# Patient Record
Sex: Female | Born: 1955 | Race: White | Hispanic: No | Marital: Married | Smoking: Never smoker
Health system: Southern US, Community
[De-identification: ages and names within clinical notes are randomized; demographics above are authoritative.]

## PROBLEM LIST (undated history)

## (undated) DIAGNOSIS — F3289 Other specified depressive episodes: Secondary | ICD-10-CM

## (undated) DIAGNOSIS — R51 Headache: Secondary | ICD-10-CM

## (undated) DIAGNOSIS — R519 Headache, unspecified: Secondary | ICD-10-CM

## (undated) DIAGNOSIS — F411 Generalized anxiety disorder: Secondary | ICD-10-CM

## (undated) DIAGNOSIS — M1990 Unspecified osteoarthritis, unspecified site: Secondary | ICD-10-CM

## (undated) DIAGNOSIS — K219 Gastro-esophageal reflux disease without esophagitis: Secondary | ICD-10-CM

## (undated) DIAGNOSIS — K449 Diaphragmatic hernia without obstruction or gangrene: Secondary | ICD-10-CM

## (undated) DIAGNOSIS — R011 Cardiac murmur, unspecified: Secondary | ICD-10-CM

## (undated) DIAGNOSIS — R7303 Prediabetes: Secondary | ICD-10-CM

## (undated) DIAGNOSIS — F329 Major depressive disorder, single episode, unspecified: Secondary | ICD-10-CM

## (undated) DIAGNOSIS — F410 Panic disorder [episodic paroxysmal anxiety] without agoraphobia: Secondary | ICD-10-CM

## (undated) DIAGNOSIS — M810 Age-related osteoporosis without current pathological fracture: Secondary | ICD-10-CM

## (undated) DIAGNOSIS — L719 Rosacea, unspecified: Secondary | ICD-10-CM

## (undated) DIAGNOSIS — E785 Hyperlipidemia, unspecified: Secondary | ICD-10-CM

## (undated) DIAGNOSIS — K635 Polyp of colon: Secondary | ICD-10-CM

## (undated) DIAGNOSIS — K5790 Diverticulosis of intestine, part unspecified, without perforation or abscess without bleeding: Secondary | ICD-10-CM

## (undated) HISTORY — DX: Major depressive disorder, single episode, unspecified: F32.9

## (undated) HISTORY — DX: Other specified depressive episodes: F32.89

## (undated) HISTORY — DX: Generalized anxiety disorder: F41.1

## (undated) HISTORY — DX: Hyperlipidemia, unspecified: E78.5

## (undated) HISTORY — DX: Unspecified osteoarthritis, unspecified site: M19.90

## (undated) HISTORY — DX: Rosacea, unspecified: L71.9

## (undated) HISTORY — DX: Panic disorder (episodic paroxysmal anxiety): F41.0

## (undated) HISTORY — DX: Diverticulosis of intestine, part unspecified, without perforation or abscess without bleeding: K57.90

## (undated) HISTORY — DX: Diaphragmatic hernia without obstruction or gangrene: K44.9

## (undated) HISTORY — DX: Headache: R51

## (undated) HISTORY — PX: FOOT SURGERY: SHX648

## (undated) HISTORY — DX: Polyp of colon: K63.5

## (undated) HISTORY — DX: Age-related osteoporosis without current pathological fracture: M81.0

## (undated) HISTORY — PX: COLONOSCOPY: SHX174

## (undated) HISTORY — DX: Headache, unspecified: R51.9

## (undated) HISTORY — DX: Gastro-esophageal reflux disease without esophagitis: K21.9

---

## 1997-06-02 ENCOUNTER — Ambulatory Visit (HOSPITAL_COMMUNITY): Admission: RE | Admit: 1997-06-02 | Discharge: 1997-06-02 | Payer: Self-pay | Admitting: Obstetrics and Gynecology

## 1998-06-08 ENCOUNTER — Encounter: Payer: Self-pay | Admitting: Obstetrics and Gynecology

## 1998-06-08 ENCOUNTER — Ambulatory Visit (HOSPITAL_COMMUNITY): Admission: RE | Admit: 1998-06-08 | Discharge: 1998-06-08 | Payer: Self-pay | Admitting: Obstetrics and Gynecology

## 1998-06-22 ENCOUNTER — Ambulatory Visit (HOSPITAL_BASED_OUTPATIENT_CLINIC_OR_DEPARTMENT_OTHER): Admission: RE | Admit: 1998-06-22 | Discharge: 1998-06-22 | Payer: Self-pay | Admitting: General Surgery

## 1999-03-12 ENCOUNTER — Other Ambulatory Visit: Admission: RE | Admit: 1999-03-12 | Discharge: 1999-03-12 | Payer: Self-pay | Admitting: Obstetrics and Gynecology

## 1999-06-10 ENCOUNTER — Encounter: Payer: Self-pay | Admitting: Obstetrics and Gynecology

## 1999-06-10 ENCOUNTER — Ambulatory Visit (HOSPITAL_COMMUNITY): Admission: RE | Admit: 1999-06-10 | Discharge: 1999-06-10 | Payer: Self-pay | Admitting: Obstetrics and Gynecology

## 2000-02-03 ENCOUNTER — Other Ambulatory Visit: Admission: RE | Admit: 2000-02-03 | Discharge: 2000-02-03 | Payer: Self-pay | Admitting: Obstetrics and Gynecology

## 2000-07-13 ENCOUNTER — Encounter: Payer: Self-pay | Admitting: Obstetrics and Gynecology

## 2000-07-13 ENCOUNTER — Ambulatory Visit (HOSPITAL_COMMUNITY): Admission: RE | Admit: 2000-07-13 | Discharge: 2000-07-13 | Payer: Self-pay | Admitting: Obstetrics and Gynecology

## 2001-03-31 ENCOUNTER — Other Ambulatory Visit: Admission: RE | Admit: 2001-03-31 | Discharge: 2001-03-31 | Payer: Self-pay | Admitting: Obstetrics and Gynecology

## 2001-07-14 ENCOUNTER — Ambulatory Visit (HOSPITAL_COMMUNITY): Admission: RE | Admit: 2001-07-14 | Discharge: 2001-07-14 | Payer: Self-pay | Admitting: Obstetrics and Gynecology

## 2001-07-14 ENCOUNTER — Encounter: Payer: Self-pay | Admitting: Obstetrics and Gynecology

## 2001-10-27 ENCOUNTER — Encounter (INDEPENDENT_AMBULATORY_CARE_PROVIDER_SITE_OTHER): Payer: Self-pay | Admitting: *Deleted

## 2001-10-27 ENCOUNTER — Ambulatory Visit (HOSPITAL_COMMUNITY): Admission: RE | Admit: 2001-10-27 | Discharge: 2001-10-27 | Payer: Self-pay | Admitting: *Deleted

## 2002-03-28 ENCOUNTER — Other Ambulatory Visit: Admission: RE | Admit: 2002-03-28 | Discharge: 2002-03-28 | Payer: Self-pay | Admitting: Obstetrics and Gynecology

## 2002-08-17 ENCOUNTER — Ambulatory Visit (HOSPITAL_COMMUNITY): Admission: RE | Admit: 2002-08-17 | Discharge: 2002-08-17 | Payer: Self-pay | Admitting: Obstetrics and Gynecology

## 2002-08-17 ENCOUNTER — Encounter: Payer: Self-pay | Admitting: Obstetrics and Gynecology

## 2002-08-26 ENCOUNTER — Encounter: Admission: RE | Admit: 2002-08-26 | Discharge: 2002-08-26 | Payer: Self-pay | Admitting: Obstetrics and Gynecology

## 2002-08-26 ENCOUNTER — Encounter: Payer: Self-pay | Admitting: Obstetrics and Gynecology

## 2002-08-31 ENCOUNTER — Emergency Department (HOSPITAL_COMMUNITY): Admission: EM | Admit: 2002-08-31 | Discharge: 2002-08-31 | Payer: Self-pay | Admitting: Emergency Medicine

## 2002-08-31 ENCOUNTER — Encounter: Payer: Self-pay | Admitting: Emergency Medicine

## 2003-02-03 ENCOUNTER — Encounter: Admission: RE | Admit: 2003-02-03 | Discharge: 2003-02-03 | Payer: Self-pay | Admitting: Obstetrics and Gynecology

## 2003-04-06 ENCOUNTER — Other Ambulatory Visit: Admission: RE | Admit: 2003-04-06 | Discharge: 2003-04-06 | Payer: Self-pay | Admitting: Obstetrics and Gynecology

## 2003-09-13 ENCOUNTER — Encounter: Admission: RE | Admit: 2003-09-13 | Discharge: 2003-09-13 | Payer: Self-pay | Admitting: Obstetrics and Gynecology

## 2004-04-16 ENCOUNTER — Other Ambulatory Visit: Admission: RE | Admit: 2004-04-16 | Discharge: 2004-04-16 | Payer: Self-pay | Admitting: Obstetrics and Gynecology

## 2004-09-18 ENCOUNTER — Encounter: Admission: RE | Admit: 2004-09-18 | Discharge: 2004-09-18 | Payer: Self-pay | Admitting: Obstetrics and Gynecology

## 2005-04-22 ENCOUNTER — Other Ambulatory Visit: Admission: RE | Admit: 2005-04-22 | Discharge: 2005-04-22 | Payer: Self-pay | Admitting: Obstetrics and Gynecology

## 2005-06-05 ENCOUNTER — Encounter: Admission: RE | Admit: 2005-06-05 | Discharge: 2005-06-05 | Payer: Self-pay | Admitting: Obstetrics and Gynecology

## 2005-06-19 ENCOUNTER — Encounter: Admission: RE | Admit: 2005-06-19 | Discharge: 2005-06-19 | Payer: Self-pay | Admitting: Obstetrics and Gynecology

## 2005-10-17 ENCOUNTER — Encounter: Admission: RE | Admit: 2005-10-17 | Discharge: 2005-10-17 | Payer: Self-pay | Admitting: Addiction Medicine

## 2006-04-23 ENCOUNTER — Other Ambulatory Visit: Admission: RE | Admit: 2006-04-23 | Discharge: 2006-04-23 | Payer: Self-pay | Admitting: Obstetrics and Gynecology

## 2006-09-09 ENCOUNTER — Ambulatory Visit: Payer: Self-pay | Admitting: Internal Medicine

## 2006-09-24 ENCOUNTER — Encounter: Payer: Self-pay | Admitting: Internal Medicine

## 2006-09-24 ENCOUNTER — Ambulatory Visit: Payer: Self-pay | Admitting: Internal Medicine

## 2006-09-24 DIAGNOSIS — K5730 Diverticulosis of large intestine without perforation or abscess without bleeding: Secondary | ICD-10-CM | POA: Insufficient documentation

## 2006-09-24 DIAGNOSIS — K449 Diaphragmatic hernia without obstruction or gangrene: Secondary | ICD-10-CM | POA: Insufficient documentation

## 2006-09-24 DIAGNOSIS — D126 Benign neoplasm of colon, unspecified: Secondary | ICD-10-CM

## 2006-12-02 ENCOUNTER — Ambulatory Visit: Payer: Self-pay | Admitting: Internal Medicine

## 2006-12-10 ENCOUNTER — Encounter: Admission: RE | Admit: 2006-12-10 | Discharge: 2006-12-10 | Payer: Self-pay | Admitting: Obstetrics and Gynecology

## 2007-03-11 HISTORY — PX: BREAST BIOPSY: SHX20

## 2007-05-11 ENCOUNTER — Other Ambulatory Visit: Admission: RE | Admit: 2007-05-11 | Discharge: 2007-05-11 | Payer: Self-pay | Admitting: Obstetrics and Gynecology

## 2007-05-21 DIAGNOSIS — R519 Headache, unspecified: Secondary | ICD-10-CM | POA: Insufficient documentation

## 2007-05-21 DIAGNOSIS — R51 Headache: Secondary | ICD-10-CM | POA: Insufficient documentation

## 2007-05-21 DIAGNOSIS — K621 Rectal polyp: Secondary | ICD-10-CM

## 2007-05-21 DIAGNOSIS — F411 Generalized anxiety disorder: Secondary | ICD-10-CM | POA: Insufficient documentation

## 2007-05-21 DIAGNOSIS — F410 Panic disorder [episodic paroxysmal anxiety] without agoraphobia: Secondary | ICD-10-CM

## 2007-05-21 DIAGNOSIS — K620 Anal polyp: Secondary | ICD-10-CM | POA: Insufficient documentation

## 2007-05-21 DIAGNOSIS — F3289 Other specified depressive episodes: Secondary | ICD-10-CM | POA: Insufficient documentation

## 2007-05-21 DIAGNOSIS — F329 Major depressive disorder, single episode, unspecified: Secondary | ICD-10-CM

## 2007-12-20 ENCOUNTER — Encounter: Admission: RE | Admit: 2007-12-20 | Discharge: 2007-12-20 | Payer: Self-pay | Admitting: Obstetrics and Gynecology

## 2007-12-29 ENCOUNTER — Encounter (INDEPENDENT_AMBULATORY_CARE_PROVIDER_SITE_OTHER): Payer: Self-pay | Admitting: Diagnostic Radiology

## 2007-12-29 ENCOUNTER — Encounter: Admission: RE | Admit: 2007-12-29 | Discharge: 2007-12-29 | Payer: Self-pay | Admitting: Obstetrics and Gynecology

## 2008-06-06 ENCOUNTER — Ambulatory Visit: Payer: Self-pay | Admitting: Obstetrics and Gynecology

## 2008-06-06 ENCOUNTER — Encounter: Payer: Self-pay | Admitting: Obstetrics and Gynecology

## 2008-06-06 ENCOUNTER — Other Ambulatory Visit: Admission: RE | Admit: 2008-06-06 | Discharge: 2008-06-06 | Payer: Self-pay | Admitting: Obstetrics and Gynecology

## 2008-06-29 ENCOUNTER — Encounter: Admission: RE | Admit: 2008-06-29 | Discharge: 2008-06-29 | Payer: Self-pay | Admitting: Obstetrics and Gynecology

## 2008-12-21 ENCOUNTER — Encounter: Admission: RE | Admit: 2008-12-21 | Discharge: 2008-12-21 | Payer: Self-pay | Admitting: Obstetrics and Gynecology

## 2009-06-14 ENCOUNTER — Other Ambulatory Visit: Admission: RE | Admit: 2009-06-14 | Discharge: 2009-06-14 | Payer: Self-pay | Admitting: Obstetrics and Gynecology

## 2009-06-14 ENCOUNTER — Ambulatory Visit: Payer: Self-pay | Admitting: Obstetrics and Gynecology

## 2009-12-24 ENCOUNTER — Encounter: Admission: RE | Admit: 2009-12-24 | Discharge: 2009-12-24 | Payer: Self-pay | Admitting: Obstetrics and Gynecology

## 2010-04-30 DIAGNOSIS — M899 Disorder of bone, unspecified: Secondary | ICD-10-CM | POA: Insufficient documentation

## 2010-06-20 ENCOUNTER — Encounter (INDEPENDENT_AMBULATORY_CARE_PROVIDER_SITE_OTHER): Payer: BC Managed Care – PPO | Admitting: Obstetrics and Gynecology

## 2010-06-20 ENCOUNTER — Other Ambulatory Visit (HOSPITAL_COMMUNITY)
Admission: RE | Admit: 2010-06-20 | Discharge: 2010-06-20 | Disposition: A | Discharge status: Home / Self Care | Payer: BC Managed Care – PPO | Source: Ambulatory Visit | Attending: Obstetrics and Gynecology | Admitting: Obstetrics and Gynecology

## 2010-06-20 ENCOUNTER — Other Ambulatory Visit: Payer: Self-pay | Admitting: Obstetrics and Gynecology

## 2010-06-20 DIAGNOSIS — Z124 Encounter for screening for malignant neoplasm of cervix: Secondary | ICD-10-CM | POA: Insufficient documentation

## 2010-06-20 DIAGNOSIS — N912 Amenorrhea, unspecified: Secondary | ICD-10-CM

## 2010-06-20 DIAGNOSIS — Z01419 Encounter for gynecological examination (general) (routine) without abnormal findings: Secondary | ICD-10-CM

## 2010-06-20 DIAGNOSIS — R823 Hemoglobinuria: Secondary | ICD-10-CM

## 2010-07-23 NOTE — Assessment & Plan Note (Signed)
[facility]                         GASTROENTEROLOGY OFFICE NOTE   JAZSMIN, COUSE                       MRN:          [id number]  DATE:12/02/2006                            DOB:          02-Sep-1955    Ms. Stivers is a very nice 55 year old white female whom we saw at Dr.  Lonell Face recommendation on September 24, 2006, for upper endoscopy and  colonoscopy.  She was then having gastroesophageal reflux and also  history of adenomatous polyp from previous colonoscopy about 5 years  before that.  Since the colonoscopy, she has had intermittent rectal  bleeding of bright red blood especially when she wipes.  Her bowel  habits have been quite irregular.  She denies any abdominal pain.  As  far as her upper endoscopy is concerned, she was found to have a smaller  __________  hiatal hernia measuring 2 to 3 cm.  She has been on Aciphex  20 mg today with good control of her symptoms.  She wanted to know  exactly what symptoms hiatal hernia causes and how to treat it.   MEDICATIONS:  1. Effexor 75 mg p.o. daily.  2. Aciphex 200 mg p.o. daily.  3. Birth control pills.  4. Multiple vitamins, Ocuvite and vitamins C, B and E.   PHYSICAL EXAMINATION:  Blood pressure 114/74, pulse 74 and weight 152  pounds.  She was alert, oriented and in no distress.  ABDOMEN EXAMINATION:  Showed normal abdomen with no tenderness.  Normoactive bowel sounds.  No distention.  Rectal and endoscopic  examination:  Reveals normal perianal area, normoactive tones.  There is  hypertrophy to papillae in the anal canal at the dentate line.  One of  them seemed to be prolapsing and seemed to be erythematous with some  contact bleeding.  There was small unremarkable hemorrhoids without  stigmata of bleeding.  Stool is Hemoccult negative.   IMPRESSION:  1. A 55 year old white female with anal source of bleeding originating      at the dentate line of the anal canal.  Her rectal papillae  seem to      be hypertrophic, most likely causing intermittent bleeding due to      straining and diarrhea.  2. Gastroesophageal reflux disease and small hiatal hernia currently      controlled on Aciphex.   PLAN:  1. I have explained to the patient the mechanisms of hiatal hernia      with the gastroesophageal reflux, so she understands.  She will      continue on anti-reflux measure.  2. Analpram-HC cream 2-1/2% to use p.r.n.  Samples given.  3. Irritable bowel regimen.  High fiber diet with fiber supplements as      needed.  I showed her      that there is no danger from her bleeding; that her colon      examination was normal with the exception of small hyperplastic      polyps in the rectal sigmoid which were all removed.     Hedwig Morton. Juanda Chance, MD  Electronically Signed    DMB/MedQ  DD: 12/02/2006  DT: 12/03/2006  Job #: [id number]   cc:   Kendrick Ranch, M.D.

## 2010-08-08 ENCOUNTER — Telehealth: Payer: Self-pay | Admitting: Internal Medicine

## 2010-08-08 NOTE — Telephone Encounter (Signed)
Left a message for patient to call me back.

## 2010-08-08 NOTE — Telephone Encounter (Signed)
Spoke with patient. For a couple of months now, she has had rectal bleeding and rectal pain. She saw her PCP and was given Anusol HC suppositories which she is using. She is still having the bleeding and pain and would like to be seen. Scheduled patient with Willette Cluster, NP on 08/14/10 at 11:00 AM(Dr. Juanda Chance supervising). Last colon 7/17/8- adenomatous polyps, diverticulosis

## 2010-08-12 NOTE — Telephone Encounter (Signed)
reviewed and agree for pt to see Gunnar Fusi

## 2010-08-14 ENCOUNTER — Encounter: Payer: Self-pay | Admitting: Nurse Practitioner

## 2010-08-14 ENCOUNTER — Ambulatory Visit (INDEPENDENT_AMBULATORY_CARE_PROVIDER_SITE_OTHER): Payer: BC Managed Care – PPO | Admitting: Nurse Practitioner

## 2010-08-14 VITALS — BP 120/80 | HR 68 | Ht 62.0 in | Wt 144.4 lb

## 2010-08-14 DIAGNOSIS — K602 Anal fissure, unspecified: Secondary | ICD-10-CM

## 2010-08-14 MED ORDER — AMBULATORY NON FORMULARY MEDICATION: Status: DC

## 2010-08-14 MED ORDER — LIDOCAINE HCL 2 % EX GEL: CUTANEOUS | Status: AC

## 2010-08-14 NOTE — Progress Notes (Signed)
08/14/2010 Gabrielle Gonzalez [id number] 1955/07/02   HISTORY OF PRESENT ILLNESS:  Patient is a 55 year old female last seen at time of her colonoscopy in 2008 which was done by Dr. Juanda Chance for history of colon polyps. Two weeks ago patient developed some intermittent rectal bleeding and burning inside of her anus. Now having severe anal pain with bowel movements. She had this happen in the fall at which time she was given Anusol and stool softeners and had complete recovery. Currently it hurts to even insert suppositories. As far as bowel movements, patient gives a history of chronic alternating constipation and diarrhea.    Past Medical History  Diagnosis Date  . Colon polyp   . Diverticulosis   . Hiatal hernia   . Headache, chronic daily   . Panic disorder without agoraphobia   . Anxiety state, unspecified   . Depressive disorder, not elsewhere classified    Past Surgical History  Procedure Date  . Anal polyp removal     reports that she has never smoked. She does not have any smokeless tobacco history on file. She reports that she drinks alcohol. She reports that she does not use illicit drugs. family history includes Prostate cancer in her father.  There is no history of Colon cancer. Allergies  Allergen Reactions  . Erythromycin   . Lodine (Etodolac)   . Penicillins       Outpatient Encounter Prescriptions as of 08/14/2010  Medication Sig Dispense Refill  . Cholecalciferol (VITAMIN D3) 1000 UNITS CAPS Take 1 capsule by mouth daily.        . Omega-3 Fatty Acids (FISH OIL) 1000 MG CPDR Take 1 capsule by mouth daily.        Marland Kitchen omeprazole (PRILOSEC OTC) 20 MG tablet Take 20 mg by mouth as needed.        . venlafaxine (EFFEXOR) 75 MG tablet Take 75 mg by mouth daily.        Marland Kitchen DISCONTD: Multiple Vitamin (MULTIVITAMIN) tablet Take 1 tablet by mouth daily.        Marland Kitchen DISCONTD: Multiple Vitamins-Minerals (OCUVITE ADULT FORMULA PO) Take 1 tablet by mouth daily.        Marland Kitchen DISCONTD: RABEprazole  (ACIPHEX) 20 MG tablet Take 20 mg by mouth daily.           REVIEW OF SYSTEMS  : All other systems reviewed and negative except where noted in the History of Present Illness.   PHYSICAL EXAM: General: Well developed white female in no acute distress Head: Normocephalic and atraumatic Eyes:  sclerae anicteric,conjunctive pink. Ears: Normal auditory acuity Mouth: No deformity or lesions Neck: Supple, no masses.  Lungs: Clear throughout to auscultation Heart: Regular rate and rhythm; no murmurs heard Abdomen: Soft, non distended, nontender. No masses or hepatomegaly noted. Normal Bowel sounds Rectal: Midline posterior fissure. Digital rectal exam not done secondary to patient's physical discomfort. Musculoskeletal: Symmetrical with no gross deformities  Skin: No lesions on visible extremities Extremities: No edema or deformities noted Neurological: Alert oriented x 4, grossly nonfocal Cervical Nodes:  No significant cervical adenopathy Psychological:  Alert and cooperative. Normal mood and affect  ASSESSMENT AND PLAN;

## 2010-08-14 NOTE — Patient Instructions (Signed)
Rx. For Diltiazem gel phoned in to [facility] for you to pick up. Xylocaine gel sent to [facility]  Miralax samples given for you to take daily. Call office  in 10 days and let Gunnar Fusi know how you are doing.  [phone].  (ask for Pam)

## 2010-08-15 DIAGNOSIS — K602 Anal fissure, unspecified: Secondary | ICD-10-CM | POA: Insufficient documentation

## 2010-08-15 NOTE — Assessment & Plan Note (Addendum)
Severe anal pain and rectal bleeding with midline posterior anal fissure examination. Of note, patient has a history of hyperplastic anal polyps, she is up-to-date on her colorectal cancer screening. Will treat fissure with diltiazem gel. For severe anal pain will give her Xylocaine jelly to use as needed. We discussed sitz baths several times a day. Patient has a history of irritable bowel syndrome with alternating constipation and diarrhea. She has tended towards constipation recently. Will try her on daily MiraLax which she can titrate to her needs. She should return to clinic in 3-4 weeks, or earlier if symptoms are not responding to treatment.

## 2010-08-18 NOTE — Progress Notes (Signed)
Reviewed and agree with plan of treatment 

## 2010-12-04 ENCOUNTER — Other Ambulatory Visit: Payer: Self-pay | Admitting: Obstetrics and Gynecology

## 2010-12-04 ENCOUNTER — Other Ambulatory Visit: Payer: Self-pay | Admitting: Urology

## 2010-12-04 DIAGNOSIS — R51 Headache: Secondary | ICD-10-CM

## 2010-12-04 DIAGNOSIS — Z1231 Encounter for screening mammogram for malignant neoplasm of breast: Secondary | ICD-10-CM

## 2010-12-10 ENCOUNTER — Ambulatory Visit
Admission: RE | Admit: 2010-12-10 | Discharge: 2010-12-10 | Disposition: A | Discharge status: Home / Self Care | Payer: BC Managed Care – PPO | Source: Ambulatory Visit | Attending: Urology | Admitting: Urology

## 2010-12-10 DIAGNOSIS — R51 Headache: Secondary | ICD-10-CM

## 2010-12-10 MED ORDER — GADOBENATE DIMEGLUMINE 529 MG/ML IV SOLN
14.0000 mL | Freq: Once | INTRAVENOUS | Status: AC | PRN
  Administered 2010-12-10: 14 mL via INTRAVENOUS

## 2010-12-26 ENCOUNTER — Ambulatory Visit
Admission: RE | Admit: 2010-12-26 | Discharge: 2010-12-26 | Disposition: A | Discharge status: Home / Self Care | Payer: BC Managed Care – PPO | Source: Ambulatory Visit | Attending: Obstetrics and Gynecology | Admitting: Obstetrics and Gynecology

## 2010-12-26 DIAGNOSIS — Z1231 Encounter for screening mammogram for malignant neoplasm of breast: Secondary | ICD-10-CM

## 2011-06-13 ENCOUNTER — Encounter: Payer: Self-pay | Admitting: Gynecology

## 2011-06-13 DIAGNOSIS — L719 Rosacea, unspecified: Secondary | ICD-10-CM | POA: Insufficient documentation

## 2011-06-13 DIAGNOSIS — R32 Unspecified urinary incontinence: Secondary | ICD-10-CM | POA: Insufficient documentation

## 2011-06-23 ENCOUNTER — Ambulatory Visit (INDEPENDENT_AMBULATORY_CARE_PROVIDER_SITE_OTHER): Payer: BC Managed Care – PPO | Admitting: Obstetrics and Gynecology

## 2011-06-23 ENCOUNTER — Encounter: Payer: Self-pay | Admitting: Obstetrics and Gynecology

## 2011-06-23 VITALS — BP 120/76 | Ht 62.0 in | Wt 145.0 lb

## 2011-06-23 DIAGNOSIS — Z01419 Encounter for gynecological examination (general) (routine) without abnormal findings: Secondary | ICD-10-CM

## 2011-06-23 MED ORDER — ESTROGENS, CONJUGATED 0.625 MG/GM VA CREA: TOPICAL_CREAM | Freq: Every day | VAGINAL | Status: AC

## 2011-06-23 NOTE — Progress Notes (Signed)
Patient came to see me today for her annual GYN exam. She still has irregular cycles. She does have some mild hot flashes. She takes Effexor and we wonder if that isn't helping the above. She uses Premarin vaginal cream for atrophic vaginitis. She is not having pelvic pain. She has no bladder symptoms. She is up-to-date on mammograms.  Physical examination: Kennon Portela present. HEENT within normal limits. Neck: Thyroid not large. No masses. Supraclavicular nodes: not enlarged. Breasts: Examined in both sitting and lying  position. No skin changes and no masses. Abdomen: Soft no guarding rebound or masses or hernia. Pelvic: External: Within normal limits. BUS: Within normal limits. Vaginal:within normal limits. Poor estrogen effect. No evidence of cystocele rectocele or enterocele. Cervix: clean. Uterus: Normal size and shape. Adnexa: No masses. Rectovaginal exam: Confirmatory and negative. Extremities: Within normal limits.  Assessment: #1. Mild transitional symptoms #2. Atrophic vaginitis  Plan: Continue her mammograms yearly. Reordered Premarin vaginal cream. Lab work through her PCP. Call if she needs systemic estrogen.

## 2011-06-23 NOTE — Patient Instructions (Signed)
Call if night sweats get worse.

## 2011-06-24 LAB — URINALYSIS W MICROSCOPIC + REFLEX CULTURE
Bilirubin Urine: NEGATIVE
Casts: NONE SEEN
Leukocytes, UA: NEGATIVE
Nitrite: NEGATIVE
pH: 7.5 (ref 5.0–8.0)

## 2011-11-26 ENCOUNTER — Other Ambulatory Visit: Payer: Self-pay | Admitting: Obstetrics and Gynecology

## 2011-11-26 ENCOUNTER — Encounter: Payer: Self-pay | Admitting: Internal Medicine

## 2011-11-26 DIAGNOSIS — Z1231 Encounter for screening mammogram for malignant neoplasm of breast: Secondary | ICD-10-CM

## 2011-12-24 ENCOUNTER — Encounter: Payer: Self-pay | Admitting: *Deleted

## 2011-12-29 ENCOUNTER — Ambulatory Visit: Payer: BC Managed Care – PPO

## 2012-01-19 ENCOUNTER — Ambulatory Visit
Admission: RE | Admit: 2012-01-19 | Discharge: 2012-01-19 | Disposition: A | Discharge status: Home / Self Care | Payer: BC Managed Care – PPO | Source: Ambulatory Visit | Attending: Obstetrics and Gynecology | Admitting: Obstetrics and Gynecology

## 2012-01-19 DIAGNOSIS — Z1231 Encounter for screening mammogram for malignant neoplasm of breast: Secondary | ICD-10-CM

## 2012-01-20 ENCOUNTER — Ambulatory Visit (INDEPENDENT_AMBULATORY_CARE_PROVIDER_SITE_OTHER): Payer: BC Managed Care – PPO | Admitting: Internal Medicine

## 2012-01-20 ENCOUNTER — Encounter: Payer: Self-pay | Admitting: Internal Medicine

## 2012-01-20 VITALS — BP 100/76 | HR 68 | Ht 62.25 in | Wt 143.8 lb

## 2012-01-20 DIAGNOSIS — K602 Anal fissure, unspecified: Secondary | ICD-10-CM

## 2012-01-20 DIAGNOSIS — K621 Rectal polyp: Secondary | ICD-10-CM

## 2012-01-20 DIAGNOSIS — K219 Gastro-esophageal reflux disease without esophagitis: Secondary | ICD-10-CM

## 2012-01-20 DIAGNOSIS — K620 Anal polyp: Secondary | ICD-10-CM

## 2012-01-20 MED ORDER — OMEPRAZOLE MAGNESIUM 20 MG PO TBEC: 20.0000 mg | DELAYED_RELEASE_TABLET | Freq: Every day | ORAL | Status: DC

## 2012-01-20 NOTE — Patient Instructions (Addendum)
We have sent the following medications to your pharmacy for you to pick up at your convenience: Omeprazole  CC: Dr [name]

## 2012-01-20 NOTE — Progress Notes (Signed)
Gabrielle Gonzalez 07-24-1955 MRN [id number]  History of Present Illness:  This is a 56 year old white female with chronic gastroesophageal reflux evaluated by a prior upper endoscopy in 2000 and July 2008 with findings of 2-3 cm hiatal hernia and nonobstructing fibrous ring at the GE junction. She has been on proton pump inhibitors for many years with good results. She is interested in stopping her PPIs but every time she tapers it, she develops recurrent nocturnal cough and heartburn especially at night. She has a history of anal fissure which was evaluated in June 2012. It is currently asymptomatic. She has skin tags around the rectum which becomes sore but denies any rectal bleeding. Her bowel habits have been regular. Her last colonoscopy in 2008 showed a hyperplastic polyp. A recall colonoscopy is scheduled for 2015.   Past Medical History  Diagnosis Date  . Hyperplastic colon polyp   . Diverticulosis   . Hiatal hernia   . Headache, chronic daily   . Panic disorder without agoraphobia   . Anxiety state, unspecified   . Depressive disorder, not elsewhere classified   . Rosacea   . Urinary incontinence     Stress incontinence   Past Surgical History  Procedure Date  . Cesarean section   . Foot surgery     reports that she has never smoked. She has never used smokeless tobacco. She reports that she does not drink alcohol or use illicit drugs. family history includes Diabetes in her father; Hypertension in her mother and sister; Other in her mother; and Prostate cancer in her father.  There is no history of Colon cancer. Allergies  Allergen Reactions  . Erythromycin   . Lodine (Etodolac)   . Penicillins         Review of Systems: Denies dysphagia, positive for heartburn and indigestion  The remainder of the 10 point ROS is negative except as outlined in H&P   Physical Exam: General appearance  Well developed, in no distress. Eyes- non icteric. HEENT nontraumatic,  normocephalic. Mouth no lesions, tongue papillated, no cheilosis. Neck supple without adenopathy, thyroid not enlarged, no carotid bruits, no JVD. Lungs Clear to auscultation bilaterally. Cor normal S1, normal S2, regular rhythm, no murmur,  quiet precordium. Abdomen: Soft nontender with minimal discomfort in left lower quadrant. No distention. Liver edge at costal margin. Rectal: And anoscopic exam reveals numerous external hemorrhoidal tags. No edema or bleeding. No thrombosis. Normal rectal sphincter tone. Prominent papillae at the 9:00 likely secondary to prior anal fissure repair by Dr. Earlene Plater. Small first grade internal hemorrhoids, not prolapsing. Stool is Hemoccult negative. Extremities no pedal edema. Skin no lesions. Neurological alert and oriented x 3. Psychological normal mood and affect.  Assessment and Plan:  Problem #1 gastroesophageal reflux. Patient had a hiatal hernia documented on a prior upper endoscopy. She will have to stay on omeprazole 20 mg a day as long as she is having heartburn and nocturnal cough. We have discussed antireflux measures including head of the bed elevation and weight loss to avoid LPR.. We will send her a prescription for omeprazole 20 mg daily. An upper endoscopy could be done when she is due for a recall colonoscopy in 2015 to rule out Barrett's esophagus.  Problem #2 anal fissure. Currently not active but has a history of external hemorrhoids and small internal hemorrhoids as well. I have advised her to use Preparation H cream when necessary and stay on a high-fiber diet. She will be due for a colonoscopy for followup of  a hyperplastic polyp in 2015.    01/20/2012 Gabrielle Gonzalez

## 2012-09-21 ENCOUNTER — Encounter: Payer: Self-pay | Admitting: Internal Medicine

## 2012-09-21 ENCOUNTER — Ambulatory Visit (INDEPENDENT_AMBULATORY_CARE_PROVIDER_SITE_OTHER): Payer: BC Managed Care – PPO | Admitting: Internal Medicine

## 2012-09-21 VITALS — BP 102/72 | HR 68 | Ht 62.0 in | Wt 144.1 lb

## 2012-09-21 DIAGNOSIS — K219 Gastro-esophageal reflux disease without esophagitis: Secondary | ICD-10-CM

## 2012-09-21 MED ORDER — OMEPRAZOLE 20 MG PO CPDR: 20.0000 mg | DELAYED_RELEASE_CAPSULE | Freq: Two times a day (BID) | ORAL | Status: DC

## 2012-09-21 NOTE — Progress Notes (Signed)
Gabrielle Gonzalez 1955-11-23 MRN [id number]   History of Present Illness:  This is a 57 year old white female with chronic gastroesophageal reflux who was last seen in November 2013. A prior evaluation was in  2000 and again in 2008 with upper endoscopy and showed 2-3 cm hiatal hernia and obstructing fibrous ring. Patient was under good control with Prilosec 20 mg every morning up until several months ago when she developed breakthrough symptoms. These occur mostly at night. Recently, on 2 occasions, she woke up with burning in her chest and coughing up acid.. The last episode lasted about a week. She denies a change in her diet, taking anti-inflammatory medications or drinking alcohol. Her weight has been stable. She has a history of an anal fissure which was repaired by Dr. Earlene Plater and recurred in 2012 but is currently under good control. She will be due for a recall colonoscopy in July 2015  since she had a hyperplastic polyp on her last colonoscopy in July 2008.   Past Medical History  Diagnosis Date  . Hyperplastic colon polyp   . Diverticulosis   . Hiatal hernia   . Headache, chronic daily   . Panic disorder without agoraphobia   . Anxiety state, unspecified   . Depressive disorder, not elsewhere classified   . Rosacea   . Urinary incontinence     Stress incontinence   Past Surgical History  Procedure Laterality Date  . Cesarean section    . Foot surgery      reports that she has never smoked. She has never used smokeless tobacco. She reports that she does not drink alcohol or use illicit drugs. family history includes Diabetes in her father; Hypertension in her mother and sister; Other in her mother; and Prostate cancer in her father.  There is no history of Colon cancer. Allergies  Allergen Reactions  . Erythromycin   . Lodine (Etodolac)   . Penicillins         Review of Systems: Occasional dysphagia, nocturnal cough and hoarseness  The remainder of the 10 point ROS is  negative except as outlined in H&P   Physical Exam: General appearance  Well developed, in no distress. Normal voice, no cough Eyes- non icteric. HEENT nontraumatic, normocephalic. Mouth no lesions, tongue papillated, no cheilosis. Neck supple without adenopathy, thyroid not enlarged, no carotid bruits, no JVD. Lungs Clear to auscultation bilaterally. Cor normal S1, normal S2, regular rhythm, no murmur,  quiet precordium. Abdomen: Soft nontender with normal active bowel sounds. Minimal and minimal discomfort in left lower quadrant. No fullness. Rectal: External hemorrhoidal tags. Somewhat tender anal canal. No prolapse. Stool is mostly Hemoccult negative with a few specks of heme positive. Extremities no pedal edema. Skin no lesions. Neurological alert and oriented x 3. Psychological normal mood and affect.  Assessment and Plan:  Problem #32 57 year old white female with symptomatic gastroesophageal reflux only partially controlled on Prilosec 20 mg every morning. We will increase Prilosec to 20 mg by mouth twice a day and she will continue antireflux measures. If symptoms continue, I would suggest a repeat upper endoscopy and biopsy.  Problem #2 Colorectal screening. Patient has a history of hyperplastic polyps x 2 in 2008. A recall colonoscopy will be due in July 2015.    09/21/2012 Lina Sar

## 2012-09-21 NOTE — Patient Instructions (Addendum)
We have sent the following medications to your pharmacy for you to pick up at your convenience: Prilosec 20 mg twice daily  You will be due for a recall endoscopy/colonoscopy in 09/2013. We will send you a reminder in the mail when it gets closer to that time.  Cc: [name], NP

## 2013-01-05 ENCOUNTER — Other Ambulatory Visit: Payer: Self-pay

## 2013-01-05 DIAGNOSIS — Z1231 Encounter for screening mammogram for malignant neoplasm of breast: Secondary | ICD-10-CM

## 2013-01-13 ENCOUNTER — Other Ambulatory Visit: Payer: Self-pay

## 2013-02-01 ENCOUNTER — Ambulatory Visit
Admission: RE | Admit: 2013-02-01 | Discharge: 2013-02-01 | Disposition: A | Discharge status: Home / Self Care | Payer: BC Managed Care – PPO | Source: Ambulatory Visit

## 2013-02-01 DIAGNOSIS — Z1231 Encounter for screening mammogram for malignant neoplasm of breast: Secondary | ICD-10-CM

## 2013-04-05 ENCOUNTER — Encounter: Payer: Self-pay | Admitting: *Deleted

## 2013-05-10 ENCOUNTER — Ambulatory Visit (INDEPENDENT_AMBULATORY_CARE_PROVIDER_SITE_OTHER): Payer: BC Managed Care – PPO | Admitting: Internal Medicine

## 2013-05-10 ENCOUNTER — Encounter: Payer: Self-pay | Admitting: Internal Medicine

## 2013-05-10 VITALS — BP 102/72 | HR 64 | Ht 62.0 in | Wt 143.0 lb

## 2013-05-10 DIAGNOSIS — Z800 Family history of malignant neoplasm of digestive organs: Secondary | ICD-10-CM

## 2013-05-10 DIAGNOSIS — K219 Gastro-esophageal reflux disease without esophagitis: Secondary | ICD-10-CM

## 2013-05-10 MED ORDER — PREPOPIK 10-3.5-12 MG-GM-GM PO PACK: 1.0000 | PACK | ORAL | Status: DC

## 2013-05-10 NOTE — Progress Notes (Signed)
Gabrielle Gonzalez [birth year] [id number]  Note: This dictation was prepared with Dragon digital system. Any transcriptional errors that result from this procedure are unintentional.   History of Present Illness:  This is a 58 year old white female with a family history of colon cancer in her father who was diagnosed with stage IV colon cancer in October 2014. Gabrielle Gonzalez is asymptomatic. Her bowel habits are regular. There has been no rectal bleeding. A prior colonoscopy in 2003 was normal ,susequent exam in July 2008 showed colon polyps which were all hyperplastic. She also has a history of gastroesophageal reflux with 2-3 cm hiatal hernia and a mild stricture on upper endoscopy in July 2008. She is taking omeprazole 20 mg daily. She denies dysphagia or odynophagia.    Past Medical History  Diagnosis Date  . Hyperplastic colon polyp   . Diverticulosis   . Hiatal hernia   . Headache, chronic daily   . Panic disorder without agoraphobia   . Anxiety state, unspecified   . Depressive disorder, not elsewhere classified   . Rosacea   . Urinary incontinence     Stress incontinence    Past Surgical History  Procedure Laterality Date  . Cesarean section    . Foot surgery      Allergies  Allergen Reactions  . Erythromycin   . Lodine [Etodolac]   . Penicillins     Family history and social history have been reviewed.  Review of Systems: No change in weight. Denies rectal bleeding  The remainder of the 10 point ROS is negative except as outlined in the H&P  Physical Exam: General Appearance Well developed, in no distress Eyes  Non icteric  HEENT  Non traumatic, normocephalic  Mouth No lesion, tongue papillated, no cheilosis Neck Supple without adenopathy, thyroid not enlarged, no carotid bruits, no JVD Lungs Clear to auscultation bilaterally COR Normal S1, normal S2, regular rhythm, no murmur, quiet precordium Abdomen soft nontender abdomen with normal active bowel sounds. Liver  edge at costal margin. No distention Rectal external hemorrhoidal tags. Normal rectal sphincter tone. Mobile 1 cm nodule in the rectal ampulla feels like old scarred hemorrhoids or papillae, it prolapses with straining. Stool is Hemoccult negative Extremities  No pedal edema Skin No lesions Neurological Alert and oriented x 3 Psychological Normal mood and affect  Assessment and Plan:   Problem #1 Family history of colon cancer in patient's father. Patient has a personal history of a hyperplastic polyp in 2008. She will be scheduled for a screening colonoscopy. Patient has a small rectal nodule, most likely of no clinical significance. We will evaluate it during colonoscopy.  Problem #2 Gastroesophageal reflux. We will schedule an upper endoscopy on the day of colonoscopy.      Delfin Edis 05/10/2013

## 2013-05-10 NOTE — Patient Instructions (Addendum)
You have been scheduled for an endoscopy and colonoscopy with propofol. Please follow the written instructions given to you at your visit today. Please pick up your prep at the pharmacy within the next 1-3 days. If you use inhalers (even only as needed), please bring them with you on the day of your procedure. Your physician has requested that you go to www.startemmi.com and enter the access code given to you at your visit today. This web site gives a general overview about your procedure. However, you should still follow specific instructions given to you by our office regarding your preparation for the procedure.  CC: Gabrielle Gonzalez

## 2013-06-07 ENCOUNTER — Ambulatory Visit (AMBULATORY_SURGERY_CENTER): Payer: BC Managed Care – PPO | Admitting: Internal Medicine

## 2013-06-07 ENCOUNTER — Encounter: Payer: Self-pay | Admitting: Internal Medicine

## 2013-06-07 VITALS — BP 116/80 | HR 63 | Temp 98.1°F | Resp 17 | Ht 62.0 in | Wt 143.0 lb

## 2013-06-07 DIAGNOSIS — D126 Benign neoplasm of colon, unspecified: Secondary | ICD-10-CM

## 2013-06-07 DIAGNOSIS — Z1211 Encounter for screening for malignant neoplasm of colon: Secondary | ICD-10-CM

## 2013-06-07 DIAGNOSIS — K219 Gastro-esophageal reflux disease without esophagitis: Secondary | ICD-10-CM

## 2013-06-07 DIAGNOSIS — Z800 Family history of malignant neoplasm of digestive organs: Secondary | ICD-10-CM

## 2013-06-07 MED ORDER — SODIUM CHLORIDE 0.9 % IV SOLN: 500.0000 mL | INTRAVENOUS | Status: DC

## 2013-06-07 NOTE — Patient Instructions (Signed)
YOU HAD AN ENDOSCOPIC PROCEDURE TODAY AT THE [facility]: Refer to the procedure report that was given to you for any specific questions about what was found during the examination.  If the procedure report does not answer your questions, please call your gastroenterologist to clarify.  If you requested that your care partner not be given the details of your procedure findings, then the procedure report has been included in a sealed envelope for you to review at your convenience later.  YOU SHOULD EXPECT: Some feelings of bloating in the abdomen. Passage of more gas than usual.  Walking can help get rid of the air that was put into your GI tract during the procedure and reduce the bloating. If you had a lower endoscopy (such as a colonoscopy or flexible sigmoidoscopy) you may notice spotting of blood in your stool or on the toilet paper. If you underwent a bowel prep for your procedure, then you may not have a normal bowel movement for a few days.  DIET: Your first meal following the procedure should be a light meal and then it is ok to progress to your normal diet.  A half-sandwich or bowl of soup is an example of a good first meal.  Heavy or fried foods are harder to digest and may make you feel nauseous or bloated.  Likewise meals heavy in dairy and vegetables can cause extra gas to form and this can also increase the bloating.  Drink plenty of fluids but you should avoid alcoholic beverages for 24 hours.  ACTIVITY: Your care partner should take you home directly after the procedure.  You should plan to take it easy, moving slowly for the rest of the day.  You can resume normal activity the day after the procedure however you should NOT DRIVE or use heavy machinery for 24 hours (because of the sedation medicines used during the test).    SYMPTOMS TO REPORT IMMEDIATELY: A gastroenterologist can be reached at any hour.  During normal business hours, 8:30 AM to 5:00 PM Monday through Friday,  call [phone].  After hours and on weekends, please call the GI answering service at [phone] who will take a message and have the physician on call contact you.   Following lower endoscopy (colonoscopy or flexible sigmoidoscopy):  Excessive amounts of blood in the stool  Significant tenderness or worsening of abdominal pains  Swelling of the abdomen that is new, acute  Fever of 100F or higher  Following upper endoscopy (EGD)  Vomiting of blood or coffee ground material  New chest pain or pain under the shoulder blades  Painful or persistently difficult swallowing  New shortness of breath  Fever of 100F or higher  Black, tarry-looking stools  FOLLOW UP: If any biopsies were taken you will be contacted by phone or by letter within the next 1-3 weeks.  Call your gastroenterologist if you have not heard about the biopsies in 3 weeks.  Our staff will call the home number listed on your records the next business day following your procedure to check on you and address any questions or concerns that you may have at that time regarding the information given to you following your procedure. This is a courtesy call and so if there is no answer at the home number and we have not heard from you through the emergency physician on call, we will assume that you have returned to your regular daily activities without incident.  SIGNATURES/CONFIDENTIALITY: You and/or your care   partner have signed paperwork which will be entered into your electronic medical record.  These signatures attest to the fact that that the information above on your After Visit Summary has been reviewed and is understood.  Full responsibility of the confidentiality of this discharge information lies with you and/or your care-partner.  

## 2013-06-07 NOTE — Op Note (Signed)
[place]  [address] [place], [address]   COLONOSCOPY PROCEDURE REPORT  PATIENT: Gabrielle Gonzalez, Gabrielle Gonzalez  MR#: [id number] BIRTHDATE: 1955-05-08 , 43  yrs. old GENDER: Female ENDOSCOPIST: Lafayette Dragon, MD REFERRED BY:Dr Eldridge Abrahams PROCEDURE DATE:  06/07/2013 PROCEDURE:   Colonoscopy with cold biopsy polypectomy First Screening Colonoscopy - Avg.  risk and is 50 yrs.  old or older - No.  Prior Negative Screening - Now for repeat screening. 10 or more years since last screening Prior Negative Screening - Now for repeat screening.  Above average risk  History of Adenoma - Now for follow-up colonoscopy & has been > or = to 3 yrs.  N/A Polyps Removed Today? Yes. ASA CLASS:   Class II INDICATIONS:Patient's immediate family history of colon cancer and father with stage IV colon cancer diagnosed in October 2014.  Prior colonoscopy in 2003 and 2008.  Hyperplastic polyps in 2008. MEDICATIONS: MAC sedation, administered by CRNA and propofol (Diprivan) 150mg  IV  DESCRIPTION OF PROCEDURE:   After the risks benefits and alternatives of the procedure were thoroughly explained, informed consent was obtained.  A digital rectal exam revealed no abnormalities of the rectum.   The LB [id number]  endoscope was introduced through the anus and advanced to the cecum, which was identified by both the appendix and ileocecal valve. No adverse events experienced.   The quality of the prep was good, using MoviPrep  The instrument was then slowly withdrawn as the colon was fully examined.      COLON FINDINGS: Two smooth sessile polyps ranging between 3-61mm in size were found in the descending colon.  A polypectomy was performed.  The resection was complete and the polyp tissue was completely retrieved.  Retroflexed views revealed no abnormalities. The time to cecum=3 minutes 24 seconds.  Withdrawal time=8 minutes 20 seconds.  The scope was withdrawn and the procedure  completed. COMPLICATIONS: There were no complications.  ENDOSCOPIC IMPRESSION: Two sessile polyps ranging between 3-34mm in size were found in the descending colon; polypectomy was performed mild diverticulosis of the sigmoid colon RECOMMENDATIONS: 1.  Await pathology results 2.  high fiber diet Recall colonoscopy in 5 years   eSigned:  Lafayette Dragon, MD 06/07/2013 2:28 PM   cc:   PATIENT NAME:  Yardley, Beltran MR#: [id number]

## 2013-06-07 NOTE — Progress Notes (Signed)
Called to room to assist during endoscopic procedure.  Patient ID and intended procedure confirmed with present staff. Received instructions for my participation in the procedure from the performing physician.  

## 2013-06-07 NOTE — Op Note (Signed)
[place]  [address]. [place], [address]   ENDOSCOPY PROCEDURE REPORT  PATIENT: Gabrielle Gonzalez, Gabrielle Gonzalez  MR#: [id number] BIRTHDATE: 01-07-1956 , 69  yrs. old GENDER: Female ENDOSCOPIST: Lafayette Dragon, MD REFERRED BY:  Dr Eldridge Abrahams PROCEDURE DATE:  06/07/2013 PROCEDURE:  EGD w/ biopsy ASA CLASS:     Class II INDICATIONS:  History of esophageal reflux.   Therapy of esophageal reflux.   prior EGD July 2008 showed mild esophageal stricture which was not dilated. MEDICATIONS: MAC sedation, administered by CRNA and propofol (Diprivan) 200mg  IV TOPICAL ANESTHETIC: Cetacaine Spray  DESCRIPTION OF PROCEDURE: After the risks benefits and alternatives of the procedure were thoroughly explained, informed consent was obtained.  The LB [id number] endoscope was introduced through the mouth and advanced to the second portion of the duodenum. Without limitations.  The instrument was slowly withdrawn as the mucosa was fully examined.      Esophagus, and upper mid and distal esophageal mucosa appeared normal. Squamocolumnar junction was normal. There was no stricture. There was reducible 3 cm hiatal hernia without Cameron erosions Stomach: Gastric mucosa appeared normal in the body and the gastric antrum. Pyloric outlet was normal. Retroflexion of the endoscope revealed normal fundus and cardia Duodenum duodenal bulb and descending duodenum was normal[ The scope was then withdrawn from the patient and the procedure completed.  COMPLICATIONS: There were no complications. ENDOSCOPIC IMPRESSION:  3 cm reducible hiatal hernia, no stricture  RECOMMENDATIONS: 1.  Anti-reflux regimen to be follow 2.  Continue PPI  REPEAT EXAM: no  eSigned:  Lafayette Dragon, MD 06/07/2013 2:21 PM   CC:  PATIENT NAME:  [name], [name] MR#: [id number]

## 2013-06-08 ENCOUNTER — Telehealth: Payer: Self-pay | Admitting: *Deleted

## 2013-06-08 NOTE — Telephone Encounter (Signed)
  Follow up Call-  Call back number 06/07/2013  Post procedure Call Back phone  # [phone]  Permission to leave phone message Yes     Patient questions:  Do you have a fever, pain , or abdominal swelling? no Pain Score  0 *  Have you tolerated food without any problems? yes  Have you been able to return to your normal activities? yes  Do you have any questions about your discharge instructions: Diet   no Medications  no Follow up visit  no  Do you have questions or concerns about your Care? no  Actions: * If pain score is 4 or above: No action needed, pain <4.

## 2013-06-14 ENCOUNTER — Encounter: Payer: Self-pay | Admitting: Internal Medicine

## 2013-07-08 ENCOUNTER — Telehealth: Payer: Self-pay | Admitting: Internal Medicine

## 2013-07-08 NOTE — Telephone Encounter (Signed)
Please send 1% hydrocortisone cream ,15 gm, apply bid, 1 refill

## 2013-07-08 NOTE — Telephone Encounter (Signed)
Dr Olevia Perches, please advise... Last I see, patient got diltiazem gel back in 2012.

## 2013-07-10 ENCOUNTER — Other Ambulatory Visit: Payer: Self-pay | Admitting: Internal Medicine

## 2013-07-11 MED ORDER — HYDROCORTISONE 1 % EX CREA: 1.0000 "application " | TOPICAL_CREAM | Freq: Two times a day (BID) | CUTANEOUS | Status: DC

## 2013-07-11 NOTE — Telephone Encounter (Signed)
Rx sent. I have left a message for patient to call back.

## 2013-07-11 NOTE — Telephone Encounter (Signed)
Okay, so you no longer want her on diltazem, correct?

## 2013-07-11 NOTE — Telephone Encounter (Signed)
No Diltiazem, instead,please, send 1% hydrocortisone cream,

## 2013-07-11 NOTE — Telephone Encounter (Signed)
I have spoken to patient and advised her that we have sent hydrocortisone cream to pharmacy. She verbalizes understanding.

## 2013-09-02 ENCOUNTER — Telehealth: Payer: Self-pay | Admitting: Internal Medicine

## 2013-09-02 NOTE — Telephone Encounter (Signed)
Pt states she has some anal skin tags that are annoying and would like to have them removed. Pt states she discussed this with Dr. Olevia Perches when she had her colon done. Pt would like to know who Dr. Olevia Perches would recommend to remove these. Please advise.

## 2013-09-03 NOTE — Telephone Encounter (Signed)
Dr Leighton Ruff from [place] is a rectal surgeon.

## 2013-09-05 ENCOUNTER — Telehealth: Payer: Self-pay | Admitting: Internal Medicine

## 2013-09-05 DIAGNOSIS — K644 Residual hemorrhoidal skin tags: Secondary | ICD-10-CM

## 2013-09-05 NOTE — Telephone Encounter (Signed)
Patient notified of recommendation. 

## 2013-09-05 NOTE — Telephone Encounter (Signed)
Referral made and note sent to [facility]. Waiting for appointment.

## 2013-09-05 NOTE — Telephone Encounter (Signed)
Gabrielle Gonzalez,pt is scheduled for July 21st arrive at 10.10am for a 75.10CH apt with Dr Leighton Ruff....Thayer Headings  Previous [employer] with patient and gave her appointment date and time.

## 2013-09-27 ENCOUNTER — Ambulatory Visit (INDEPENDENT_AMBULATORY_CARE_PROVIDER_SITE_OTHER): Payer: BC Managed Care – PPO | Admitting: General Surgery

## 2013-09-27 ENCOUNTER — Encounter (INDEPENDENT_AMBULATORY_CARE_PROVIDER_SITE_OTHER): Payer: Self-pay | Admitting: General Surgery

## 2013-09-27 VITALS — BP 105/80 | HR 73 | Temp 97.7°F | Resp 18 | Ht 61.0 in | Wt 143.0 lb

## 2013-09-27 DIAGNOSIS — K621 Rectal polyp: Secondary | ICD-10-CM

## 2013-09-27 DIAGNOSIS — K620 Anal polyp: Secondary | ICD-10-CM

## 2013-09-27 NOTE — Patient Instructions (Signed)
Schedule an apt for removal of anal polyps at your convenience.  Plan on some pain with bowel movements and bleeding for 2-3 weeks afterwards.

## 2013-09-27 NOTE — Progress Notes (Signed)
Chief Complaint  Patient presents with  . Rectal Problems    HISTORY: Gabrielle Gonzalez is a 58 y.o. female who presents to the office with anal irritation and skin tag.  Other symptoms include feelings of prolapse.  This had been occurring for several years.  She is s/p surgery with Dr Rosana Hoes in early 2000's.  she has tried nothing in the past.  Nothing makes the symptoms worse.   It is intermittent in nature.  her bowel habits are regular and her bowel movements are inconsistent.  her fiber intake is dietary.  her last colonoscopy was in March.  2 polyps were removed.  She is due again in 5 years.  She has a FH of colon cancer as well.  Past Medical History  Diagnosis Date  . Hyperplastic colon polyp   . Diverticulosis   . Hiatal hernia   . Headache, chronic daily   . Panic disorder without agoraphobia   . Anxiety state, unspecified   . Depressive disorder, not elsewhere classified   . Rosacea   . Urinary incontinence     Stress incontinence  . Arthritis   . GERD (gastroesophageal reflux disease)   . Hyperlipidemia       Past Surgical History  Procedure Laterality Date  . Cesarean section    . Foot surgery    . Colonoscopy          Current Outpatient Prescriptions  Medication Sig Dispense Refill  . Azelaic Acid (FINACEA) 15 % cream Apply topically.      . Cholecalciferol (VITAMIN D3) 1000 UNITS CAPS Take 1 capsule by mouth daily.        . fenofibrate (TRICOR) 145 MG tablet Take 145 mg by mouth daily.      . hydrocortisone cream 1 % Apply 1 application topically 2 (two) times daily.  15 g  1  . multivitamin-lutein (OCUVITE-LUTEIN) CAPS Take 1 capsule by mouth daily.      . NON FORMULARY Tumeric once daily prn      . omeprazole (PRILOSEC) 20 MG capsule Take 20 mg by mouth daily.      Marland Kitchen omeprazole (PRILOSEC) 20 MG capsule TAKE 1 CAPSULE BY MOUTH TWICE DAILY  60 capsule  5  . Probiotic Product (PROBIOTIC DAILY PO) Take 1 capsule by mouth daily.      Marland Kitchen venlafaxine (EFFEXOR) 75 MG  tablet Take 75 mg by mouth daily.         No current facility-administered medications for this visit.      Allergies  Allergen Reactions  . Erythromycin   . Lodine [Etodolac]   . Penicillins       Family History  Problem Relation Age of Onset  . Prostate cancer Father   . Diabetes Father   . Colon cancer Father   . Hypertension Mother   . Other Mother     Carcinoid tumor of intestine  . Hypertension Sister   . Liver cancer Father     mets from colon    History   Social History  . Marital Status: Married    Spouse Name: N/A    Number of Children: 2  . Years of Education: N/A   Occupational History  . Manager [employer]   Social History Main Topics  . Smoking status: Never Smoker   . Smokeless tobacco: Never Used  . Alcohol Use: Yes     Comment: very rare  . Drug Use: No  . Sexual Activity: Yes  Birth Control/ Protection: Condom   Other Topics Concern  . None   Social History Narrative  . None      REVIEW OF SYSTEMS - PERTINENT POSITIVES ONLY: Review of Systems - General ROS: negative for - chills, fever or weight loss Hematological and Lymphatic ROS: negative for - bleeding problems, blood clots or bruising Respiratory ROS: no cough, shortness of breath, or wheezing Cardiovascular ROS: no chest pain or dyspnea on exertion Gastrointestinal ROS: no abdominal pain, change in bowel habits, or black or bloody stools Genito-Urinary ROS: no dysuria, trouble voiding, or hematuria  EXAM: Filed Vitals:   09/27/13 1021  BP: 105/80  Pulse: 73  Temp: 97.7 F (36.5 C)  Resp: 18    General appearance: alert and cooperative Resp: clear to auscultation bilaterally Cardio: regular rate and rhythm GI: soft, non-tender; bowel sounds normal; no masses,  no organomegaly  Procedure: Anoscopy Surgeon: Marcello Moores Diagnosis: anal polyps  Assistant: Avelina Laine. After the risks and benefits were explained, verbal consent was obtained for above procedure   Anesthesia: none Findings: L lateral internal hemorrhoid with 2 anal polyps    ASSESSMENT AND PLAN: Gabrielle Gonzalez is a 58 y.o. female with 2 anal polyps on a hemorrhoid that prolapse and cause irritation.  I offered to remove these in the office under local vs the OR.  She would like to have these removed in the office at a later date.  She had some vasovagal response to the prone procedure, so we will do this in L lateral position next time.    [name], MD Colon and Rectal Surgery / [facility]      Visit Diagnoses: No diagnosis found.  Primary Care Physician: Berkley Harvey, NP

## 2013-10-17 ENCOUNTER — Ambulatory Visit (INDEPENDENT_AMBULATORY_CARE_PROVIDER_SITE_OTHER): Payer: BC Managed Care – PPO | Admitting: General Surgery

## 2013-10-17 VITALS — BP 116/80 | HR 94 | Temp 98.4°F | Ht 62.0 in | Wt 144.0 lb

## 2013-10-17 DIAGNOSIS — K644 Residual hemorrhoidal skin tags: Secondary | ICD-10-CM

## 2013-10-17 MED ORDER — LIDOCAINE 5 % EX OINT: 1.0000 "application " | TOPICAL_OINTMENT | CUTANEOUS | Status: DC | PRN

## 2013-10-17 NOTE — Patient Instructions (Signed)
Home Instructions Following Excision of Anal Mass  Wound care - A dressing has been applied to control any bleeding or drainage immediately after your procedure.  You may remove this dressing at your first bowel movement or tomorrow morning, whichever comes first.   After the dressing is removed, clean the area gently with a mild soap and warm water and place a piece of 100% cotton over the area.  Change to cotton ever 1-3 hours while awake to keep the area clean and dry.   - Beginning tomorrow, sit in a tub of warm water for 15-20 minutes at least twice a day and after bowel movements.  This will help with healing, pain and discomfort. - A small amount of bleeding is to be expected.  If you notice an increase in the bleeding, place a large piece of cotton (about the size of a golf ball) next to the anal opening and sit on a hard surface for 15 minutes.  If the bleeding persists or if you are concerned, please call the office.  Do not sit on rubber rings.  Instead, sit on a soft pillow.    Diet -Eat a regular diet.  Avoid foods that may constipate you or give you diarrhea.  Drink 6-8 glasses of water a day and avoid seeds, nuts and popcorn until the area heals.  Medication -Take pain medication as directed.  Do not drive or operate machinery if you are taking a prescription pain medication.   - We recommend Extra Strength Tylenol for mild to moderate pain.  This can be taken as instructed on the bottle.   - If you are given a prescription for antibiotics, take as instructed by your doctor until the entire course is completed  Bowel Habits Avoid laxatives unless instructed by your doctor. Take a fiber supplement twice a day (Metamucil, FiberCon, Benefiber) Avoid excessive straining to have a bowel movement Do not go for more than 3 days without a bowel movement.  Take a regular Fleet enema if you are constipated.  Call the office if unable to do this or no results.    Activity Resume activities  as tolerated beginning tomorrow.  Avoid strenuous activities or sports for one week.    Call the office if you have any questions.  Call IMMEDIATELY if you should develop persistent heavy rectal bleeding, increase in pain, difficulty urinating or fever greater than 100 F.

## 2013-10-17 NOTE — Addendum Note (Signed)
Addended by: [name] on: 2/35/5732 05:03 PM   Modules accepted: Orders

## 2013-10-17 NOTE — Progress Notes (Signed)
Gabrielle Gonzalez is a 58 y.o. female who is here for a follow up visit regarding her anal irritation and skin tag. Other symptoms include feelings of prolapse. This had been occurring for several years. She is s/p surgery with Dr Rosana Hoes in early 2000's. she has tried nothing in the past. Nothing makes the symptoms worse. It is intermittent in nature. her bowel habits are regular and her bowel movements are inconsistent. her fiber intake is dietary. her last colonoscopy was in March. 2 polyps were removed. She is due again in 5 years. She has a FH of colon cancer as well.  On exam, she had 2 polyps on her L lateral hemorrhoid.  She is here today to have these removed.    Objective: Filed Vitals:   10/17/13 1635  BP: 116/80  Pulse: 94  Temp: 98.4 F (36.9 C)    General appearance: alert and cooperative GI: normal findings: soft, non-tender  Procedure: excision of anal skin tag Surgeon: Marcello Moores Assistant: Rosemary Holms After the risks and benefits were explained, verbal consent was obtained for above procedure  Anesthesia: none Diagnosis: anal skin tag irritation Procedure: Patient given local anesthesia via subcutaneous injection. Polyps were excised using scissors. Hemostasis was achieved using direct pressure. A dressing was applied.  Assessment and Plan: Gabrielle Gonzalez is a 58 y.o. F with anal discomfort due to an anal skin tag.  This was removed in the office.  RTO in 3 weeks for recheck.      [name], MD [facility], [place]

## 2013-11-10 ENCOUNTER — Encounter (INDEPENDENT_AMBULATORY_CARE_PROVIDER_SITE_OTHER): Payer: BC Managed Care – PPO | Admitting: General Surgery

## 2014-01-03 ENCOUNTER — Other Ambulatory Visit: Payer: Self-pay

## 2014-01-03 DIAGNOSIS — Z1239 Encounter for other screening for malignant neoplasm of breast: Secondary | ICD-10-CM

## 2014-01-09 ENCOUNTER — Encounter (INDEPENDENT_AMBULATORY_CARE_PROVIDER_SITE_OTHER): Payer: Self-pay | Admitting: General Surgery

## 2014-02-07 ENCOUNTER — Other Ambulatory Visit: Payer: Self-pay

## 2014-02-07 ENCOUNTER — Ambulatory Visit
Admission: RE | Admit: 2014-02-07 | Discharge: 2014-02-07 | Disposition: A | Discharge status: Home / Self Care | Payer: BC Managed Care – PPO | Source: Ambulatory Visit

## 2014-02-07 DIAGNOSIS — Z1231 Encounter for screening mammogram for malignant neoplasm of breast: Secondary | ICD-10-CM

## 2014-03-01 DIAGNOSIS — R7301 Impaired fasting glucose: Secondary | ICD-10-CM | POA: Insufficient documentation

## 2014-03-01 DIAGNOSIS — R739 Hyperglycemia, unspecified: Secondary | ICD-10-CM | POA: Insufficient documentation

## 2014-03-24 ENCOUNTER — Other Ambulatory Visit: Payer: Self-pay | Admitting: Internal Medicine

## 2014-10-30 DIAGNOSIS — M47816 Spondylosis without myelopathy or radiculopathy, lumbar region: Secondary | ICD-10-CM | POA: Insufficient documentation

## 2015-02-05 ENCOUNTER — Other Ambulatory Visit: Payer: Self-pay

## 2015-02-05 DIAGNOSIS — Z1231 Encounter for screening mammogram for malignant neoplasm of breast: Secondary | ICD-10-CM

## 2015-03-08 ENCOUNTER — Ambulatory Visit: Payer: Self-pay

## 2015-03-30 ENCOUNTER — Ambulatory Visit
Admission: RE | Admit: 2015-03-30 | Discharge: 2015-03-30 | Disposition: A | Discharge status: Home / Self Care | Payer: 59 | Source: Ambulatory Visit

## 2015-03-30 DIAGNOSIS — Z1231 Encounter for screening mammogram for malignant neoplasm of breast: Secondary | ICD-10-CM

## 2016-03-18 ENCOUNTER — Other Ambulatory Visit: Payer: Self-pay | Admitting: Obstetrics

## 2016-03-18 DIAGNOSIS — Z1231 Encounter for screening mammogram for malignant neoplasm of breast: Secondary | ICD-10-CM

## 2016-04-16 ENCOUNTER — Ambulatory Visit
Admission: RE | Admit: 2016-04-16 | Discharge: 2016-04-16 | Disposition: A | Discharge status: Home / Self Care | Payer: 59 | Source: Ambulatory Visit | Attending: Obstetrics | Admitting: Obstetrics

## 2016-04-16 DIAGNOSIS — Z1231 Encounter for screening mammogram for malignant neoplasm of breast: Secondary | ICD-10-CM

## 2016-07-02 ENCOUNTER — Ambulatory Visit (INDEPENDENT_AMBULATORY_CARE_PROVIDER_SITE_OTHER): Payer: 59 | Admitting: Gastroenterology

## 2016-07-02 ENCOUNTER — Encounter: Payer: Self-pay | Admitting: Gastroenterology

## 2016-07-02 VITALS — BP 90/70 | HR 80 | Ht 62.0 in | Wt 138.6 lb

## 2016-07-02 DIAGNOSIS — K602 Anal fissure, unspecified: Secondary | ICD-10-CM | POA: Diagnosis not present

## 2016-07-02 DIAGNOSIS — R1310 Dysphagia, unspecified: Secondary | ICD-10-CM

## 2016-07-02 DIAGNOSIS — R12 Heartburn: Secondary | ICD-10-CM

## 2016-07-02 DIAGNOSIS — K5900 Constipation, unspecified: Secondary | ICD-10-CM | POA: Diagnosis not present

## 2016-07-02 DIAGNOSIS — Z800 Family history of malignant neoplasm of digestive organs: Secondary | ICD-10-CM

## 2016-07-02 DIAGNOSIS — K219 Gastro-esophageal reflux disease without esophagitis: Secondary | ICD-10-CM

## 2016-07-02 DIAGNOSIS — D125 Benign neoplasm of sigmoid colon: Secondary | ICD-10-CM

## 2016-07-02 MED ORDER — RANITIDINE HCL 150 MG PO TABS: 150.0000 mg | ORAL_TABLET | Freq: Every day | ORAL | 3 refills | Status: DC

## 2016-07-02 MED ORDER — AMBULATORY NON FORMULARY MEDICATION: 1 refills | Status: DC

## 2016-07-02 NOTE — Progress Notes (Signed)
Gabrielle Gonzalez    [id number]    05-16-1955  Primary Care Physician:Jones, Sherrill Raring, NP  Referring Physician: Berkley Harvey, NP [address] [place],  [address]  Chief complaint:  GERD. Family history of colon cancer  HPI: 61 year old female previously followed by Dr. Olevia Perches, last seen in office March 2015 here to re establish care. Patient complains of worsening heartburn in the past year or so. She stopped taking PPI about one and half to 2 years ago because she was concerned about long-term side effects. Denies any  odynophagia, regurgitation, nausea or vomiting. She does have intermittent epigastric discomfort and also has dysphagia to solids mostly when she eats bread or sandwich. She is currently taking antacids as needed. Heartburn is worse at nighttime or later in the evening. She does wake up at night sometimes with heartburn and cough. Last EGD 06/07/2013 showed no evidence of stricture. 3 cm hiatal hernia Colonoscopy 06/07/2013 with removal of 2 small sessile polyps one of which was sessile serrated adenoma Father was diagnosed with colon cancer at age 32. Denies any diarrhea, melena or blood per rectum. She has intermittent constipation and also complaining of anorectal pain after bowel movement or when she sits for prolonged period of time.     Outpatient Encounter Prescriptions as of 07/02/2016  Medication Sig  . Azelaic Acid (FINACEA) 15 % cream Apply topically.  . B Complex Vitamins (VITAMIN B COMPLEX PO) Take 1 capsule by mouth daily.  . Cholecalciferol (VITAMIN D3) 1000 UNITS CAPS Take 1 capsule by mouth daily.    Marland Kitchen gemfibrozil (LOPID) 600 MG tablet Take 1 tablet by mouth 2 (two) times daily.  . Ivermectin (SOOLANTRA) 1 % CREA Apply topically as directed.  . Magnesium 250 MG TABS Take 1 tablet by mouth daily.  . multivitamin-lutein (OCUVITE-LUTEIN) CAPS Take 1 capsule by mouth daily.  . NON FORMULARY Tumeric once daily prn  . omeprazole  (PRILOSEC) 20 MG capsule Take 20 mg by mouth daily.  Marland Kitchen venlafaxine (EFFEXOR) 75 MG tablet Take 75 mg by mouth daily.    . [DISCONTINUED] fenofibrate (TRICOR) 145 MG tablet Take 145 mg by mouth daily.  . [DISCONTINUED] hydrocortisone cream 1 % Apply 1 application topically 2 (two) times daily.  . [DISCONTINUED] lidocaine (XYLOCAINE) 5 % ointment Apply 1 application topically as needed.  . [DISCONTINUED] omeprazole (PRILOSEC) 20 MG capsule TAKE 1 CAPSULE BY MOUTH TWICE DAILY  . [DISCONTINUED] Probiotic Product (PROBIOTIC DAILY PO) Take 1 capsule by mouth daily.   No facility-administered encounter medications on file as of 07/02/2016.     Allergies as of 07/02/2016 - Review Complete 07/02/2016  Allergen Reaction Noted  . Erythromycin  08/14/2010  . Lodine [etodolac]  08/14/2010  . Penicillins  08/14/2010    Past Medical History:  Diagnosis Date  . Anxiety state, unspecified   . Arthritis   . Depressive disorder, not elsewhere classified   . Diverticulosis   . GERD (gastroesophageal reflux disease)   . Headache, chronic daily   . Hiatal hernia   . Hyperlipidemia   . Hyperplastic colon polyp   . Panic disorder without agoraphobia   . Rosacea   . Urinary incontinence    Stress incontinence    Past Surgical History:  Procedure Laterality Date  . CESAREAN SECTION    . COLONOSCOPY    . FOOT SURGERY      Family History  Problem Relation Age of Onset  .  Prostate cancer Father   . Diabetes Father   . Colon cancer Father   . Liver cancer Father     mets from colon  . Hypertension Mother   . Other Mother     Carcinoid tumor of intestine  . Hypertension Sister     Social History   Social History  . Marital status: Married    Spouse name: N/A  . Number of children: 2  . Years of education: N/A   Occupational History  . Manager [employer]   Social History Main Topics  . Smoking status: Never Smoker  . Smokeless tobacco: Never Used  . Alcohol use Yes      Comment: very rare  . Drug use: No  . Sexual activity: Yes    Birth control/ protection: Condom   Other Topics Concern  . Not on file   Social History Narrative  . No narrative on file      Review of systems: Review of Systems  Constitutional: Negative for fever and chills.  HENT: Negative.   Eyes: Negative for blurred vision.  Respiratory: Negative for cough, shortness of breath and wheezing.   Cardiovascular: Negative for chest pain and palpitations.  Gastrointestinal: as per HPI Genitourinary: Negative for dysuria, urgency, frequency and hematuria.  Musculoskeletal: Negative for myalgias, back pain and joint pain.  Skin: Negative for itching and rash.  Neurological: Negative for dizziness, tremors, focal weakness, seizures and loss of consciousness.  Endo/Heme/Allergies: Negative for seasonal allergies.  Psychiatric/Behavioral: Negative for depression, suicidal ideas and hallucinations.  All other systems reviewed and are negative.   Physical Exam: Vitals:   07/02/16 0829  BP: 90/70  Pulse: 80   Body mass index is 25.35 kg/m. Gen:      No acute distress HEENT:  EOMI, sclera anicteric Neck:     No masses; no thyromegaly Lungs:    Clear to auscultation bilaterally; normal respiratory effort CV:         Regular rate and rhythm; no murmurs Abd:      + bowel sounds; soft, non-tender; no palpable masses, no distension Ext:    No edema; adequate peripheral perfusion Skin:      Warm and dry; no rash Neuro: alert and oriented x 3 Psych: normal mood and affect Rectal exam: Slightly increased anal sphincter tone, no tenderness Anoscopy: Small internal hemorrhoids, positive for anal fissure right posterior ,no active bleeding, normal dentate line, no visible nodules  Data Reviewed:  Reviewed labs, radiology imaging, old records and pertinent past GI work up   Assessment and Plan/Recommendations:  61 year old female with family history of colon cancer, father  diagnosed with colon cancer at age 37 and chronic GERD  Dysphagia: We'll schedule for EGD for evaluation and possible esophageal dilation The risks and benefits as well as alternatives of endoscopic procedure(s) have been discussed and reviewed. All questions answered. The patient agrees to proceed.  Heartburn: Patient is reluctant to take PPI due to potential side effects Start ranitidine 150 mg after dinner or at bedtime as needed  Anal fissure: Rectal nitroglycerin 0.125% apply P size amount 3 times daily for 6-8 weeks  Intermittent constipation: Benefiber 1 tablespoon 3 times daily with meals  Colorectal cancer screening: Increased risk due to personal history of polyps sessile serrated adenoma removed in 2015 and family history of colon cancer Due for surveillance colonoscopy in March 2020 Patient is anxious and requested colonoscopy sooner along with EGD, I reassured patient and explained in detail the reason to  wait for 5 years for surveillance colonoscopy. All her questions were answered to her satisfaction and she agreed to proceed with the plan.   Damaris Hippo , MD [phone] Mon-Fri 8a-5p [phone] after 5p, weekends, holidays  CC: Berkley Harvey, NP

## 2016-07-02 NOTE — Patient Instructions (Addendum)
You have been scheduled for an endoscopy. Please follow written instructions given to you at your visit today. If you use inhalers (even only as needed), please bring them with you on the day of your procedure. Your physician has requested that you go to www.startemmi.com and enter the access code given to you at your visit today. This web site gives a general overview about your procedure. However, you should still follow specific instructions given to you by our office regarding your preparation for the procedure.    We will send in your prescription to your pharmacy  We have given you a printed prescription to take to [facility] 1 tablespoon three times a day with meals

## 2016-07-07 ENCOUNTER — Ambulatory Visit (AMBULATORY_SURGERY_CENTER): Payer: 59 | Admitting: Gastroenterology

## 2016-07-07 ENCOUNTER — Encounter: Payer: Self-pay | Admitting: Gastroenterology

## 2016-07-07 VITALS — BP 105/78 | HR 63 | Temp 98.2°F | Resp 14 | Ht 62.0 in | Wt 138.0 lb

## 2016-07-07 DIAGNOSIS — R1310 Dysphagia, unspecified: Secondary | ICD-10-CM

## 2016-07-07 DIAGNOSIS — K219 Gastro-esophageal reflux disease without esophagitis: Secondary | ICD-10-CM

## 2016-07-07 DIAGNOSIS — K222 Esophageal obstruction: Secondary | ICD-10-CM

## 2016-07-07 MED ORDER — OMEPRAZOLE 40 MG PO CPDR: 40.0000 mg | DELAYED_RELEASE_CAPSULE | Freq: Every day | ORAL | 1 refills | Status: DC

## 2016-07-07 MED ORDER — SODIUM CHLORIDE 0.9 % IV SOLN: 500.0000 mL | INTRAVENOUS | Status: DC

## 2016-07-07 NOTE — Progress Notes (Signed)
Per Dr. Silverio Decamp pt does not need to be on a dialtation diet today.  She may resume her regular diet today.  Maw  No problems noted in the recovery room. maw

## 2016-07-07 NOTE — Progress Notes (Signed)
Pt's states no medical or surgical changes since previsit or office visit. 

## 2016-07-07 NOTE — Progress Notes (Signed)
Called to room to assist during endoscopic procedure.  Patient ID and intended procedure confirmed with present staff. Received instructions for my participation in the procedure from the performing physician.  

## 2016-07-07 NOTE — Op Note (Addendum)
[place] Patient Name: Gabrielle Gonzalez Procedure Date: 07/07/2016 10:17 AM MRN: [id number] Endoscopist: Mauri Pole , MD Age: 61 Referring MD:  Date of Birth: 21-Apr-1955 Gender: Female Account #: [id number] Procedure:                Upper GI endoscopy Indications:              Dysphagia Medicines:                Monitored Anesthesia Care Procedure:                Pre-Anesthesia Assessment:                           - Prior to the procedure, a History and Physical                            was performed, and patient medications and                            allergies were reviewed. The patient's tolerance of                            previous anesthesia was also reviewed. The risks                            and benefits of the procedure and the sedation                            options and risks were discussed with the patient.                            All questions were answered, and informed consent                            was obtained. Prior Anticoagulants: The patient has                            taken no previous anticoagulant or antiplatelet                            agents. ASA Grade Assessment: II - A patient with                            mild systemic disease. After reviewing the risks                            and benefits, the patient was deemed in                            satisfactory condition to undergo the procedure.                           After obtaining informed consent, the endoscope was  passed under direct vision. Throughout the                            procedure, the patient's blood pressure, pulse, and                            oxygen saturations were monitored continuously. The                            Endoscope was introduced through the mouth, and                            advanced to the second part of duodenum. The upper                            GI endoscopy was accomplished without  difficulty.                            The patient tolerated the procedure well. Scope In: Scope Out: Findings:                 One moderate (circumferential scarring or stenosis;                            an endoscope may pass) benign-appearing, intrinsic                            stenosis was found 28 to 29 cm from the incisors.                            This measured 1.7 cm (inner diameter) x less than                            one cm (in length) and was traversed. A TTS dilator                            was passed through the scope. Dilation with an                            18-19-20 mm balloon dilator was performed to 20 mm.                            The dilation site was examined following endoscope                            reinsertion and showed mild improvement in luminal                            narrowing. Biopsies were taken with a cold forceps                            to break the ring (peptic stricture) and for  histology.                           A large hiatal hernia ~7cm was present.                           The stomach was normal.                           The examined duodenum was normal. Complications:            No immediate complications. Estimated Blood Loss:     Estimated blood loss was minimal. Impression:               - Benign-appearing esophageal stenosis. Dilated.                            Biopsied.                           - Large hiatal hernia.                           - Normal stomach.                           - Normal examined duodenum. Recommendation:           - Patient has a contact number available for                            emergencies. The signs and symptoms of potential                            delayed complications were discussed with the                            patient. Return to normal activities tomorrow.                            Written discharge instructions were provided to the                             patient.                           - Resume previous diet.                           - Continue present medications.                           - Await pathology results.                           - No aspirin, ibuprofen, naproxen, or other                            non-steroidal anti-inflammatory drugs for 2 weeks.                           -  Use Prilosec (omeprazole) 40 mg PO daily.                           - Anti reflux measures Mauri Pole, MD 07/07/2016 10:43:55 AM This report has been signed electronically.

## 2016-07-07 NOTE — Progress Notes (Signed)
A/ox3, pleased with MAC, report to RN 

## 2016-07-07 NOTE — Patient Instructions (Addendum)
YOU HAD AN ENDOSCOPIC PROCEDURE TODAY AT [facility]:   Refer to the procedure report that was given to you for any specific questions about what was found during the examination.  If the procedure report does not answer your questions, please call your gastroenterologist to clarify.  If you requested that your care partner not be given the details of your procedure findings, then the procedure report has been included in a sealed envelope for you to review at your convenience later.  YOU SHOULD EXPECT: Some feelings of bloating in the abdomen. Passage of more gas than usual.  Walking can help get rid of the air that was put into your GI tract during the procedure and reduce the bloating. If you had a lower endoscopy (such as a colonoscopy or flexible sigmoidoscopy) you may notice spotting of blood in your stool or on the toilet paper. If you underwent a bowel prep for your procedure, you may not have a normal bowel movement for a few days.  Please Note:  You might notice some irritation and congestion in your nose or some drainage.  This is from the oxygen used during your procedure.  There is no need for concern and it should clear up in a day or so.  SYMPTOMS TO REPORT IMMEDIATELY:    Following upper endoscopy (EGD)  Vomiting of blood or coffee ground material  New chest pain or pain under the shoulder blades  Painful or persistently difficult swallowing  New shortness of breath  Fever of 100F or higher  Black, tarry-looking stools  For urgent or emergent issues, a gastroenterologist can be reached at any hour by calling [phone].   DIET:  We do recommend a small meal at first, but then you may proceed to your regular diet.  Drink plenty of fluids but you should avoid alcoholic beverages for 24 hours.  ACTIVITY:  You should plan to take it easy for the rest of today and you should NOT DRIVE or use heavy machinery until tomorrow (because of the sedation medicines used  during the test).    FOLLOW UP: Our staff will call the number listed on your records the next business day following your procedure to check on you and address any questions or concerns that you may have regarding the information given to you following your procedure. If we do not reach you, we will leave a message.  However, if you are feeling well and you are not experiencing any problems, there is no need to return our call.  We will assume that you have returned to your regular daily activities without incident.  If any biopsies were taken you will be contacted by phone or by letter within the next 1-3 weeks.  Please call us at [phone] if you have not heard about the biopsies in 3 weeks.    SIGNATURES/CONFIDENTIALITY: You and/or your care partner have signed paperwork which will be entered into your electronic medical record.  These signatures attest to the fact that that the information above on your After Visit Summary has been reviewed and is understood.  Full responsibility of the confidentiality of this discharge information lies with you and/or your care-partner.   Handouts were given to your care partner on GERD with anti-reflux measures and a hiatal hernia. Take OMEPRAZOLE 40 mg daily. You may continue taking aspirin 81 mg but No other aspirin, aspirin products,  ibuprofen, naproxen, advil, motrin, aleve, or other non-steroidal anti-inflammatory drugs for 14 days after  polyp removal. You may resume your current medications today. Await biopsy results. Please call if any questions or concerns.

## 2016-07-08 ENCOUNTER — Telehealth: Payer: Self-pay

## 2016-07-08 NOTE — Telephone Encounter (Signed)
  Follow up Call-  Call back number 07/07/2016 07/07/2016  Post procedure Call Back phone  # [phone]  Permission to leave phone message Yes -  Some recent data might be hidden     Patient questions:  Do you have a fever, pain , or abdominal swelling? No. Pain Score  0 *  Have you tolerated food without any problems? Yes.    Have you been able to return to your normal activities? Yes.    Do you have any questions about your discharge instructions: Diet   No. Medications  No. Follow up visit  No.  Do you have questions or concerns about your Care? No.  Actions: * If pain score is 4 or above: No action needed, pain <4.  No problems noted per pt. maw

## 2016-07-15 ENCOUNTER — Encounter: Payer: Self-pay | Admitting: Gastroenterology

## 2016-09-03 NOTE — Progress Notes (Signed)
Pre-op instructions left on pt voice mailbox according to pre-op check list. Please complete assessments on DOS.Marland Kitchen Pt made aware to stop taking Aspirin,vitamins, fish oil and herbal medications. Do not take any NSAIDs ie: Ibuprofen, Motrin, Advil, Naproxen ( Aleve), BC and Goody Powder or any medication containing Aspirin.

## 2016-09-04 ENCOUNTER — Encounter (HOSPITAL_COMMUNITY): Payer: Self-pay | Admitting: *Deleted

## 2016-09-04 ENCOUNTER — Observation Stay (HOSPITAL_COMMUNITY)
Admission: RE | Admit: 2016-09-04 | Discharge: 2016-09-05 | Disposition: A | Discharge status: Home / Self Care | Payer: 59 | Source: Ambulatory Visit | Attending: Orthopedic Surgery | Admitting: Orthopedic Surgery

## 2016-09-04 ENCOUNTER — Encounter (HOSPITAL_COMMUNITY)
Admission: RE | Disposition: A | Discharge status: Home / Self Care | Payer: Self-pay | Source: Ambulatory Visit | Attending: Orthopedic Surgery

## 2016-09-04 ENCOUNTER — Ambulatory Visit (HOSPITAL_COMMUNITY): Payer: 59

## 2016-09-04 ENCOUNTER — Ambulatory Visit (HOSPITAL_COMMUNITY): Payer: 59 | Admitting: Anesthesiology

## 2016-09-04 DIAGNOSIS — Z79899 Other long term (current) drug therapy: Secondary | ICD-10-CM | POA: Diagnosis not present

## 2016-09-04 DIAGNOSIS — S42291A Other displaced fracture of upper end of right humerus, initial encounter for closed fracture: Secondary | ICD-10-CM | POA: Diagnosis present

## 2016-09-04 DIAGNOSIS — Z8719 Personal history of other diseases of the digestive system: Secondary | ICD-10-CM | POA: Insufficient documentation

## 2016-09-04 DIAGNOSIS — F329 Major depressive disorder, single episode, unspecified: Secondary | ICD-10-CM | POA: Diagnosis not present

## 2016-09-04 DIAGNOSIS — Z881 Allergy status to other antibiotic agents status: Secondary | ICD-10-CM | POA: Insufficient documentation

## 2016-09-04 DIAGNOSIS — Z888 Allergy status to other drugs, medicaments and biological substances status: Secondary | ICD-10-CM | POA: Diagnosis not present

## 2016-09-04 DIAGNOSIS — F419 Anxiety disorder, unspecified: Secondary | ICD-10-CM | POA: Diagnosis not present

## 2016-09-04 DIAGNOSIS — M1990 Unspecified osteoarthritis, unspecified site: Secondary | ICD-10-CM | POA: Diagnosis not present

## 2016-09-04 DIAGNOSIS — L719 Rosacea, unspecified: Secondary | ICD-10-CM | POA: Diagnosis not present

## 2016-09-04 DIAGNOSIS — F410 Panic disorder [episodic paroxysmal anxiety] without agoraphobia: Secondary | ICD-10-CM | POA: Diagnosis not present

## 2016-09-04 DIAGNOSIS — Z419 Encounter for procedure for purposes other than remedying health state, unspecified: Secondary | ICD-10-CM

## 2016-09-04 DIAGNOSIS — Z8042 Family history of malignant neoplasm of prostate: Secondary | ICD-10-CM | POA: Diagnosis not present

## 2016-09-04 DIAGNOSIS — Y9355 Activity, bike riding: Secondary | ICD-10-CM | POA: Diagnosis not present

## 2016-09-04 DIAGNOSIS — K219 Gastro-esophageal reflux disease without esophagitis: Secondary | ICD-10-CM | POA: Insufficient documentation

## 2016-09-04 DIAGNOSIS — E785 Hyperlipidemia, unspecified: Secondary | ICD-10-CM | POA: Diagnosis not present

## 2016-09-04 DIAGNOSIS — Z880 Allergy status to penicillin: Secondary | ICD-10-CM | POA: Insufficient documentation

## 2016-09-04 DIAGNOSIS — Z800 Family history of malignant neoplasm of digestive organs: Secondary | ICD-10-CM | POA: Diagnosis not present

## 2016-09-04 DIAGNOSIS — Z86010 Personal history of colonic polyps: Secondary | ICD-10-CM | POA: Insufficient documentation

## 2016-09-04 DIAGNOSIS — S42209A Unspecified fracture of upper end of unspecified humerus, initial encounter for closed fracture: Secondary | ICD-10-CM | POA: Diagnosis present

## 2016-09-04 HISTORY — PX: ORIF HUMERUS FRACTURE: SHX2126

## 2016-09-04 SURGERY — OPEN REDUCTION INTERNAL FIXATION (ORIF) PROXIMAL HUMERUS FRACTURE
Anesthesia: General | Site: Shoulder | Laterality: Right

## 2016-09-04 MED ORDER — DEXAMETHASONE SODIUM PHOSPHATE 10 MG/ML IJ SOLN
INTRAMUSCULAR | Status: AC
  Filled 2016-09-04: qty 1

## 2016-09-04 MED ORDER — MIDAZOLAM HCL 2 MG/2ML IJ SOLN
2.0000 mg | Freq: Once | INTRAMUSCULAR | Status: AC
Start: 2016-09-04 — End: 2016-09-04
  Administered 2016-09-04: 2 mg via INTRAVENOUS

## 2016-09-04 MED ORDER — FENTANYL CITRATE (PF) 100 MCG/2ML IJ SOLN: 25.0000 ug | INTRAMUSCULAR | Status: DC | PRN

## 2016-09-04 MED ORDER — PROPOFOL 10 MG/ML IV BOLUS
INTRAVENOUS | Status: DC | PRN
  Administered 2016-09-04: 160 mg via INTRAVENOUS

## 2016-09-04 MED ORDER — NEOSTIGMINE METHYLSULFATE 10 MG/10ML IV SOLN
INTRAVENOUS | Status: DC | PRN
  Administered 2016-09-04: 3 mg via INTRAVENOUS

## 2016-09-04 MED ORDER — CEFAZOLIN SODIUM-DEXTROSE 2-4 GM/100ML-% IV SOLN
2.0000 g | Freq: Four times a day (QID) | INTRAVENOUS | Status: AC
  Administered 2016-09-04 – 2016-09-05 (×2): 2 g via INTRAVENOUS
  Filled 2016-09-04 (×3): qty 100

## 2016-09-04 MED ORDER — FENTANYL CITRATE (PF) 100 MCG/2ML IJ SOLN
INTRAMUSCULAR | Status: AC
  Administered 2016-09-04: 100 ug via INTRAVENOUS
  Filled 2016-09-04: qty 2

## 2016-09-04 MED ORDER — ONDANSETRON HCL 4 MG PO TABS: 4.0000 mg | ORAL_TABLET | Freq: Four times a day (QID) | ORAL | Status: DC | PRN

## 2016-09-04 MED ORDER — MENTHOL 3 MG MT LOZG: 1.0000 | LOZENGE | OROMUCOSAL | Status: DC | PRN

## 2016-09-04 MED ORDER — FENTANYL CITRATE (PF) 100 MCG/2ML IJ SOLN
INTRAMUSCULAR | Status: DC | PRN
  Administered 2016-09-04: 50 ug via INTRAVENOUS

## 2016-09-04 MED ORDER — FENTANYL CITRATE (PF) 100 MCG/2ML IJ SOLN
100.0000 ug | Freq: Once | INTRAMUSCULAR | Status: AC
  Administered 2016-09-04: 100 ug via INTRAVENOUS

## 2016-09-04 MED ORDER — LACTATED RINGERS IV SOLN
INTRAVENOUS | Status: DC
  Administered 2016-09-04: 13:00:00 via INTRAVENOUS

## 2016-09-04 MED ORDER — VENLAFAXINE HCL 75 MG PO TABS
75.0000 mg | ORAL_TABLET | Freq: Every day | ORAL | Status: DC
  Administered 2016-09-05: 75 mg via ORAL
  Filled 2016-09-04: qty 1

## 2016-09-04 MED ORDER — BUPIVACAINE-EPINEPHRINE (PF) 0.5% -1:200000 IJ SOLN
INTRAMUSCULAR | Status: DC | PRN
  Administered 2016-09-04: 30 mL via PERINEURAL

## 2016-09-04 MED ORDER — HYDROMORPHONE HCL 1 MG/ML IJ SOLN: 1.0000 mg | INTRAMUSCULAR | Status: DC | PRN

## 2016-09-04 MED ORDER — OXYCODONE HCL 5 MG PO TABS
5.0000 mg | ORAL_TABLET | ORAL | Status: DC | PRN
  Administered 2016-09-04: 10 mg via ORAL
  Administered 2016-09-04: 5 mg via ORAL
  Administered 2016-09-05: 10 mg via ORAL
  Filled 2016-09-04 (×2): qty 2

## 2016-09-04 MED ORDER — GLYCOPYRROLATE 0.2 MG/ML IJ SOLN
INTRAMUSCULAR | Status: DC | PRN
  Administered 2016-09-04: 0.4 mg via INTRAVENOUS

## 2016-09-04 MED ORDER — MEPERIDINE HCL 25 MG/ML IJ SOLN: 6.2500 mg | INTRAMUSCULAR | Status: DC | PRN

## 2016-09-04 MED ORDER — POLYETHYLENE GLYCOL 3350 17 G PO PACK: 17.0000 g | PACK | Freq: Every day | ORAL | Status: DC | PRN

## 2016-09-04 MED ORDER — LACTATED RINGERS IV SOLN
INTRAVENOUS | Status: DC
  Administered 2016-09-04: 16:00:00 via INTRAVENOUS

## 2016-09-04 MED ORDER — ONDANSETRON HCL 4 MG/2ML IJ SOLN
INTRAMUSCULAR | Status: AC
  Filled 2016-09-04: qty 2

## 2016-09-04 MED ORDER — CEFAZOLIN SODIUM-DEXTROSE 2-4 GM/100ML-% IV SOLN
2.0000 g | INTRAVENOUS | Status: AC
Start: 2016-09-04 — End: 2016-09-04
  Administered 2016-09-04: 2 g via INTRAVENOUS
  Filled 2016-09-04: qty 100

## 2016-09-04 MED ORDER — ROCURONIUM BROMIDE 100 MG/10ML IV SOLN
INTRAVENOUS | Status: DC | PRN
  Administered 2016-09-04: 50 mg via INTRAVENOUS

## 2016-09-04 MED ORDER — OXYCODONE HCL 5 MG PO TABS
ORAL_TABLET | ORAL | Status: AC
  Filled 2016-09-04: qty 1

## 2016-09-04 MED ORDER — PHENOL 1.4 % MT LIQD: 1.0000 | OROMUCOSAL | Status: DC | PRN

## 2016-09-04 MED ORDER — MIDAZOLAM HCL 2 MG/2ML IJ SOLN
INTRAMUSCULAR | Status: AC
  Administered 2016-09-04: 2 mg via INTRAVENOUS
  Filled 2016-09-04: qty 2

## 2016-09-04 MED ORDER — DIPHENHYDRAMINE HCL 12.5 MG/5ML PO ELIX: 12.5000 mg | ORAL_SOLUTION | ORAL | Status: DC | PRN

## 2016-09-04 MED ORDER — LIDOCAINE 2% (20 MG/ML) 5 ML SYRINGE
INTRAMUSCULAR | Status: AC
  Filled 2016-09-04: qty 5

## 2016-09-04 MED ORDER — PHENYLEPHRINE HCL 10 MG/ML IJ SOLN
INTRAVENOUS | Status: DC | PRN
  Administered 2016-09-04: 25 ug/min via INTRAVENOUS

## 2016-09-04 MED ORDER — BISACODYL 5 MG PO TBEC: 5.0000 mg | DELAYED_RELEASE_TABLET | Freq: Every day | ORAL | Status: DC | PRN

## 2016-09-04 MED ORDER — DOCUSATE SODIUM 100 MG PO CAPS
100.0000 mg | ORAL_CAPSULE | Freq: Two times a day (BID) | ORAL | Status: DC
  Administered 2016-09-04 – 2016-09-05 (×2): 100 mg via ORAL
  Filled 2016-09-04 (×2): qty 1

## 2016-09-04 MED ORDER — DIAZEPAM 5 MG PO TABS
5.0000 mg | ORAL_TABLET | Freq: Four times a day (QID) | ORAL | Status: DC | PRN
  Administered 2016-09-04: 5 mg via ORAL
  Filled 2016-09-04: qty 1

## 2016-09-04 MED ORDER — PROPOFOL 10 MG/ML IV BOLUS
INTRAVENOUS | Status: AC
  Filled 2016-09-04: qty 20

## 2016-09-04 MED ORDER — 0.9 % SODIUM CHLORIDE (POUR BTL) OPTIME
TOPICAL | Status: DC | PRN
  Administered 2016-09-04: 1000 mL

## 2016-09-04 MED ORDER — ROCURONIUM BROMIDE 10 MG/ML (PF) SYRINGE
PREFILLED_SYRINGE | INTRAVENOUS | Status: AC
Start: 2016-09-04 — End: 2016-09-04
  Filled 2016-09-04: qty 5

## 2016-09-04 MED ORDER — ACETAMINOPHEN 325 MG PO TABS
650.0000 mg | ORAL_TABLET | Freq: Four times a day (QID) | ORAL | Status: DC | PRN
  Administered 2016-09-05 (×3): 650 mg via ORAL
  Filled 2016-09-04 (×3): qty 2

## 2016-09-04 MED ORDER — LIDOCAINE HCL (CARDIAC) 20 MG/ML IV SOLN
INTRAVENOUS | Status: DC | PRN
  Administered 2016-09-04: 80 mg via INTRAVENOUS

## 2016-09-04 MED ORDER — ONDANSETRON HCL 4 MG/2ML IJ SOLN: 4.0000 mg | Freq: Four times a day (QID) | INTRAMUSCULAR | Status: DC | PRN

## 2016-09-04 MED ORDER — ONDANSETRON HCL 4 MG/2ML IJ SOLN
INTRAMUSCULAR | Status: DC | PRN
  Administered 2016-09-04: 4 mg via INTRAVENOUS

## 2016-09-04 MED ORDER — METOCLOPRAMIDE HCL 5 MG/ML IJ SOLN: 5.0000 mg | Freq: Three times a day (TID) | INTRAMUSCULAR | Status: DC | PRN

## 2016-09-04 MED ORDER — METOCLOPRAMIDE HCL 5 MG/ML IJ SOLN
10.0000 mg | Freq: Once | INTRAMUSCULAR | Status: DC | PRN
Start: 2016-09-04 — End: 2016-09-04

## 2016-09-04 MED ORDER — MAGNESIUM CITRATE PO SOLN: 1.0000 | Freq: Once | ORAL | Status: DC | PRN

## 2016-09-04 MED ORDER — LACTATED RINGERS IV SOLN: INTRAVENOUS | Status: DC

## 2016-09-04 MED ORDER — DEXAMETHASONE SODIUM PHOSPHATE 10 MG/ML IJ SOLN
INTRAMUSCULAR | Status: DC | PRN
  Administered 2016-09-04: 10 mg via INTRAVENOUS

## 2016-09-04 MED ORDER — METOCLOPRAMIDE HCL 5 MG PO TABS: 5.0000 mg | ORAL_TABLET | Freq: Three times a day (TID) | ORAL | Status: DC | PRN

## 2016-09-04 MED ORDER — DIAZEPAM 5 MG PO TABS
ORAL_TABLET | ORAL | Status: AC
  Filled 2016-09-04: qty 1

## 2016-09-04 MED ORDER — ACETAMINOPHEN 650 MG RE SUPP: 650.0000 mg | Freq: Four times a day (QID) | RECTAL | Status: DC | PRN

## 2016-09-04 MED ORDER — SUGAMMADEX SODIUM 200 MG/2ML IV SOLN
INTRAVENOUS | Status: AC
  Filled 2016-09-04: qty 2

## 2016-09-04 MED ORDER — FENTANYL CITRATE (PF) 250 MCG/5ML IJ SOLN
INTRAMUSCULAR | Status: AC
  Filled 2016-09-04: qty 5

## 2016-09-04 SURGICAL SUPPLY — 67 items
ADH SKN CLS APL DERMABOND .7 (GAUZE/BANDAGES/DRESSINGS) ×1
AID PSTN UNV HD RSTRNT DISP (MISCELLANEOUS) ×1
BIT DRILL 3.2 (BIT) ×3
BIT DRILL 3.2XCALB NS DISP (BIT) IMPLANT
BIT DRILL CALIBRATED 2.7 (BIT) ×1 IMPLANT
BIT DRILL CALIBRATED 2.7MM (BIT) ×1
BIT DRL 3.2XCALB NS DISP (BIT) ×1
COVER SURGICAL LIGHT HANDLE (MISCELLANEOUS) ×3 IMPLANT
DERMABOND ADVANCED (GAUZE/BANDAGES/DRESSINGS) ×2
DERMABOND ADVANCED .7 DNX12 (GAUZE/BANDAGES/DRESSINGS) IMPLANT
DRAPE C-ARM 42X72 X-RAY (DRAPES) ×3 IMPLANT
DRAPE INCISE IOBAN 66X45 STRL (DRAPES) ×3 IMPLANT
DRAPE ORTHO SPLIT 77X108 STRL (DRAPES) ×6
DRAPE SURG ORHT 6 SPLT 77X108 (DRAPES) ×2 IMPLANT
DRAPE U-SHAPE 47X51 STRL (DRAPES) ×3 IMPLANT
DRSG AQUACEL AG ADV 3.5X10 (GAUZE/BANDAGES/DRESSINGS) ×3 IMPLANT
DRSG MEPILEX BORDER 4X8 (GAUZE/BANDAGES/DRESSINGS) ×1 IMPLANT
DURAPREP 26ML APPLICATOR (WOUND CARE) ×3 IMPLANT
ELECT CAUTERY BLADE 6.4 (BLADE) ×2 IMPLANT
ELECT REM PT RETURN 9FT ADLT (ELECTROSURGICAL) ×3
ELECTRODE REM PT RTRN 9FT ADLT (ELECTROSURGICAL) ×1 IMPLANT
GLOVE BIO SURGEON STRL SZ7.5 (GLOVE) ×3 IMPLANT
GLOVE BIO SURGEON STRL SZ8 (GLOVE) ×3 IMPLANT
GLOVE EUDERMIC 7 POWDERFREE (GLOVE) ×6 IMPLANT
GLOVE SS BIOGEL STRL SZ 7.5 (GLOVE) ×2 IMPLANT
GLOVE SUPERSENSE BIOGEL SZ 7.5 (GLOVE) ×4
GOWN STRL REUS W/ TWL LRG LVL3 (GOWN DISPOSABLE) ×1 IMPLANT
GOWN STRL REUS W/ TWL XL LVL3 (GOWN DISPOSABLE) ×2 IMPLANT
GOWN STRL REUS W/TWL LRG LVL3 (GOWN DISPOSABLE) ×3
GOWN STRL REUS W/TWL XL LVL3 (GOWN DISPOSABLE) ×6
K-WIRE 2X5 SS THRDED S3 (WIRE) ×3
KIT BASIN OR (CUSTOM PROCEDURE TRAY) ×3 IMPLANT
KIT ROOM TURNOVER OR (KITS) ×6 IMPLANT
KWIRE 2X5 SS THRDED S3 (WIRE) IMPLANT
MANIFOLD NEPTUNE II (INSTRUMENTS) ×3 IMPLANT
NDL SUT .5 MAYO 1.404X.05X (NEEDLE) IMPLANT
NEEDLE 22X1 1/2 (OR ONLY) (NEEDLE) ×1 IMPLANT
NEEDLE MAYO TAPER (NEEDLE)
NS IRRIG 1000ML POUR BTL (IV SOLUTION) ×3 IMPLANT
PACK SHOULDER (CUSTOM PROCEDURE TRAY) ×3 IMPLANT
PAD ARMBOARD 7.5X6 YLW CONV (MISCELLANEOUS) ×6 IMPLANT
PAD ORTHO SHOULDER 7X19 LRG (SOFTGOODS) ×1 IMPLANT
PEG LOCKING 3.2X32 (Peg) ×4 IMPLANT
PEG LOCKING 3.2X34 (Screw) ×2 IMPLANT
PEG LOCKING 3.2X36 (Screw) ×8 IMPLANT
PEG LOCKING 3.2X48 (Peg) ×4 IMPLANT
PLATE PROX HUM HI R 3H 80 (Plate) ×2 IMPLANT
RESTRAINT HEAD UNIVERSAL NS (MISCELLANEOUS) ×3 IMPLANT
SCREW LP NL T15 3.5X22 (Screw) ×2 IMPLANT
SCREW LP NL T15 3.5X24 (Screw) ×4 IMPLANT
SCREW PEG LOCK 3.2X30MM (Screw) ×2 IMPLANT
SLEEVE MEASURING 3.2 (BIT) ×2 IMPLANT
SPONGE LAP 18X18 X RAY DECT (DISPOSABLE) ×3 IMPLANT
SUCTION FRAZIER HANDLE 10FR (MISCELLANEOUS) ×2
SUCTION TUBE FRAZIER 10FR DISP (MISCELLANEOUS) ×1 IMPLANT
SUT FIBERWIRE #2 38 T-5 BLUE (SUTURE)
SUT MNCRL AB 3-0 PS2 18 (SUTURE) ×3 IMPLANT
SUT MON AB 2-0 CT1 36 (SUTURE) ×2 IMPLANT
SUT VIC AB 1 CT1 27 (SUTURE) ×3
SUT VIC AB 1 CT1 27XBRD ANTBC (SUTURE) ×1 IMPLANT
SUT VIC AB 2-0 CT1 27 (SUTURE) ×6
SUT VIC AB 2-0 CT1 TAPERPNT 27 (SUTURE) ×2 IMPLANT
SUTURE FIBERWR #2 38 T-5 BLUE (SUTURE) IMPLANT
SYR CONTROL 10ML LL (SYRINGE) ×1 IMPLANT
TOWEL OR 17X24 6PK STRL BLUE (TOWEL DISPOSABLE) ×1 IMPLANT
TOWEL OR 17X26 10 PK STRL BLUE (TOWEL DISPOSABLE) ×1 IMPLANT
WATER STERILE IRR 1000ML POUR (IV SOLUTION) ×1 IMPLANT

## 2016-09-04 NOTE — H&P (Signed)
Gabrielle Gonzalez    Chief Complaint: Right displaced proximal humerus fracture  HPI: The patient is a 61 y.o. female s/p displaced right 2 part proximal humerus fracture  Past Medical History:  Diagnosis Date  . Anxiety state, unspecified   . Arthritis   . Depressive disorder, not elsewhere classified   . Diverticulosis   . GERD (gastroesophageal reflux disease)   . Headache, chronic daily   . Hiatal hernia   . Hyperlipidemia   . Hyperplastic colon polyp   . Panic disorder without agoraphobia   . Rosacea   . Urinary incontinence    Stress incontinence    Past Surgical History:  Procedure Laterality Date  . CESAREAN SECTION    . COLONOSCOPY    . FOOT SURGERY      Family History  Problem Relation Age of Onset  . Prostate cancer Father   . Diabetes Father   . Colon cancer Father   . Liver cancer Father        mets from colon  . Hypertension Mother   . Other Mother        Carcinoid tumor of intestine  . Hypertension Sister     Social History:  reports that she has never smoked. She has never used smokeless tobacco. She reports that she drinks alcohol. She reports that she does not use drugs.   Facility-Administered Medications Prior to Admission  Medication Dose Route Frequency Provider Last Rate Last Dose  . 0.9 %  sodium chloride infusion  500 mL Intravenous Continuous Nandigam, Venia Minks, MD       Medications Prior to Admission  Medication Sig Dispense Refill  . acetaminophen (TYLENOL) 500 MG tablet Take 1,000 mg by mouth every 6 (six) hours as needed for moderate pain.    Marland Kitchen AMBULATORY NON FORMULARY MEDICATION Medication Name:  Nitroglycerin ointment every 8 hours for 6-8 weeks (Patient taking differently: Place 1 application rectally daily as needed (rectal fissure flare). Medication Name:  Nitroglycerin ointment) 30 g 1  . B Complex Vitamins (VITAMIN B COMPLEX PO) Take 1 capsule by mouth daily.    . Cholecalciferol (VITAMIN D3) 2000 units capsule Take 2,000 Units  by mouth daily.    . diazepam (VALIUM) 5 MG tablet Take 2.5-5 mg by mouth every 8 (eight) hours as needed for muscle spasms.    Marland Kitchen gemfibrozil (LOPID) 600 MG tablet Take 600 mg by mouth 2 (two) times daily.     . Ivermectin (SOOLANTRA) 1 % CREA Apply 1 application topically daily. To face    . Magnesium 250 MG TABS Take 250 mg by mouth daily.     . multivitamin-lutein (OCUVITE-LUTEIN) CAPS Take 1 capsule by mouth daily.    Marland Kitchen omeprazole (PRILOSEC) 40 MG capsule Take 1 capsule (40 mg total) by mouth daily. 90 capsule 1  . oxyCODONE-acetaminophen (PERCOCET/ROXICET) 5-325 MG tablet Take 1 tablet by mouth every 4 (four) hours as needed for severe pain.    Marland Kitchen venlafaxine (EFFEXOR) 75 MG tablet Take 75 mg by mouth daily.      . ranitidine (ZANTAC) 150 MG tablet Take 1 tablet (150 mg total) by mouth daily. After dinner (Patient not taking: Reported on 09/02/2016) 30 tablet 3     Physical Exam: right shoulder with painful and restricted motion as noted at recent office visit  Vitals  Temp:  [98.3 F (36.8 C)] 98.3 F (36.8 C) (06/28 1306) Pulse Rate:  [62] 62 (06/28 1306) Resp:  [18] 18 (06/28 1306) BP: (115)/(95) 115/95 (  06/28 1306) SpO2:  [98 %] 98 % (06/28 1306) Weight:  [63.5 kg (140 lb)] 63.5 kg (140 lb) (06/28 1306)  Assessment/Plan  Impression: Right displaced 2 part proximal humerus fracture   Plan of Action: Procedure(s): OPEN REDUCTION INTERNAL FIXATION (ORIF) PROXIMAL HUMERUS FRACTURE  Berlinda Farve M Kacee Sukhu 09/04/2016, 1:22 PM Contact # [phone]

## 2016-09-04 NOTE — Anesthesia Procedure Notes (Signed)
Anesthesia Regional Block: Supraclavicular block   Pre-Anesthetic Checklist: ,, timeout performed, Correct Patient, Correct Site, Correct Laterality, Correct Procedure, Correct Position, site marked, Risks and benefits discussed,  Surgical consent,  Pre-op evaluation,  At surgeon's request and post-op pain management  Laterality: Right and Upper  Prep: Maximum Sterile Barrier Precautions used, chloraprep       Needles:  Injection technique: Single-shot  Needle Type: Echogenic Stimulator Needle     Needle Length: 10cm      Additional Needles:   Procedures: ultrasound guided,,,,,,,,  Narrative:  Start time: 09/04/2016 1:29 PM End time: 09/04/2016 1:39 PM Injection made incrementally with aspirations every 5 mL.  Performed by: Personally  Anesthesiologist: Montez Hageman  Additional Notes: Risks, benefits and alternative to block explained extensively.  Patient tolerated procedure well, without complications.

## 2016-09-04 NOTE — Anesthesia Procedure Notes (Signed)
Procedure Name: Intubation Date/Time: 09/04/2016 2:00 PM Performed by: Rush Farmer E Pre-anesthesia Checklist: Patient identified, Emergency Drugs available, Suction available and Patient being monitored Patient Re-evaluated:Patient Re-evaluated prior to inductionOxygen Delivery Method: Circle system utilized Preoxygenation: Pre-oxygenation with 100% oxygen Intubation Type: IV induction Ventilation: Mask ventilation without difficulty Laryngoscope Size: Mac and 3 Grade View: Grade I Tube type: Oral Tube size: 7.0 mm Number of attempts: 1 Airway Equipment and Method: Stylet Placement Confirmation: ETT inserted through vocal cords under direct vision,  positive ETCO2 and breath sounds checked- equal and bilateral Secured at: 20 cm Tube secured with: Tape Dental Injury: Teeth and Oropharynx as per pre-operative assessment

## 2016-09-04 NOTE — Op Note (Signed)
09/04/2016  3:11 PM  PATIENT:   Gabrielle Gonzalez  61 y.o. female  PRE-OPERATIVE DIAGNOSIS:  Right displaced 2 part proximal humerus fracture   POST-OPERATIVE DIAGNOSIS:  same  PROCEDURE:  ORIF  SURGEON:  Evelio Rueda, Metta Clines M.D.  ASSISTANTS: Shuford pac   ANESTHESIA:   GET + ISB  EBL: 50  SPECIMEN:  none  Drains: none   PATIENT DISPOSITION:  PACU - hemodynamically stable.    PLAN OF CARE: Admit for overnight observation  Dictation# [id number]   Contact # [phone]

## 2016-09-04 NOTE — Transfer of Care (Signed)
Immediate Anesthesia Transfer of Care Note  Patient: Gabrielle Gonzalez  Procedure(s) Performed: Procedure(s): OPEN REDUCTION INTERNAL FIXATION (ORIF) PROXIMAL HUMERUS FRACTURE (Right)  Patient Location: PACU  Anesthesia Type:General  Level of Consciousness: awake, alert , oriented and patient cooperative  Airway & Oxygen Therapy: Patient Spontanous Breathing and Patient connected to nasal cannula oxygen  Post-op Assessment: Report given to RN and Post -op Vital signs reviewed and stable  Post vital signs: Reviewed and stable  Last Vitals:  Vitals:   09/04/16 1340 09/04/16 1544  BP:    Pulse: 79   Resp: 18   Temp:  (P) 36.4 C    Last Pain:  Vitals:   09/04/16 1544  TempSrc:   PainSc: (P) 0-No pain      Patients Stated Pain Goal: 5 (62/37/62 8315)  Complications: No apparent anesthesia complications

## 2016-09-04 NOTE — Anesthesia Preprocedure Evaluation (Addendum)
Anesthesia Evaluation  Patient identified by MRN, date of birth, ID band Patient awake    Reviewed: Allergy & Precautions, NPO status , Patient's Chart, lab work & pertinent test results  Airway Mallampati: II  TM Distance: >3 FB Neck ROM: Full    Dental no notable dental hx. (+) Teeth Intact   Pulmonary neg pulmonary ROS,    Pulmonary exam normal breath sounds clear to auscultation       Cardiovascular negative cardio ROS Normal cardiovascular exam Rhythm:Regular Rate:Normal     Neuro/Psych negative neurological ROS  negative psych ROS   GI/Hepatic negative GI ROS, Neg liver ROS,   Endo/Other  negative endocrine ROS  Renal/GU negative Renal ROS  negative genitourinary   Musculoskeletal negative musculoskeletal ROS (+)   Abdominal   Peds negative pediatric ROS (+)  Hematology negative hematology ROS (+)   Anesthesia Other Findings   Reproductive/Obstetrics negative OB ROS                            Anesthesia Physical Anesthesia Plan  ASA: I  Anesthesia Plan: General   Post-op Pain Management:  Regional for Post-op pain   Induction: Intravenous  PONV Risk Score and Plan: 3 and Ondansetron, Dexamethasone, Propofol and Midazolam  Airway Management Planned: Oral ETT  Additional Equipment:   Intra-op Plan:   Post-operative Plan: Extubation in OR  Informed Consent: I have reviewed the patients History and Physical, chart, labs and discussed the procedure including the risks, benefits and alternatives for the proposed anesthesia with the patient or authorized representative who has indicated his/her understanding and acceptance.   Dental advisory given  Plan Discussed with: CRNA  Anesthesia Plan Comments:         Anesthesia Quick Evaluation

## 2016-09-05 ENCOUNTER — Encounter (HOSPITAL_COMMUNITY): Payer: Self-pay | Admitting: Orthopedic Surgery

## 2016-09-05 DIAGNOSIS — S42291A Other displaced fracture of upper end of right humerus, initial encounter for closed fracture: Secondary | ICD-10-CM | POA: Diagnosis not present

## 2016-09-05 NOTE — Evaluation (Signed)
Occupational Therapy Evaluation Patient Details Name: Gabrielle Gonzalez MRN: [id number] DOB: 1955/06/26 Today's Date: 09/05/2016    History of Present Illness Right displaced proximal humerus fracture, ORIF   Clinical Impression   Patient at min A level with ADLs and independent with ADL mobility. Patient will have assist at home prn. Pt able to return demo ROM exercises of R UE as instructed by OT per MD protocol    Follow Up Recommendations  No OT follow up;DC plan and follow up therapy as arranged by surgeon;Supervision - Intermittent    Equipment Recommendations  None recommended by OT    Recommendations for Other Services       Precautions / Restrictions Precautions Precautions: Shoulder Shoulder Interventions: Shoulder sling/immobilizer;At all times;Off for dressing/bathing/exercises Precaution Booklet Issued: Yes (comment) Required Braces or Orthoses: Sling Restrictions Weight Bearing Restrictions: Yes RUE Weight Bearing: Non weight bearing      Mobility Bed Mobility Overal bed mobility: Independent                Transfers Overall transfer level: Independent                    Balance Overall balance assessment: No apparent balance deficits (not formally assessed);Independent                                         ADL either performed or assessed with clinical judgement   ADL Overall ADL's : Needs assistance/impaired     Grooming: Wash/dry hands;Wash/dry face;Standing;Set up   Upper Body Bathing: Minimal assistance   Lower Body Bathing: Minimal assistance   Upper Body Dressing : Minimal assistance   Lower Body Dressing: Minimal assistance   Toilet Transfer: Independent   Toileting- Clothing Manipulation and Hygiene: Independent   Tub/ Shower Transfer: Walk-in shower;Independent   Functional mobility during ADLs: Independent General ADL Comments: pt instructed on compensatory ADL techiques     Vision Patient  Visual Report: No change from baseline                  Pertinent Vitals/Pain Pain Assessment: 0-10 Pain Score: 2  Pain Location: R shoulder Pain Descriptors / Indicators: Other (Comment) ("pulling" sensation) Pain Intervention(s): Monitored during session;Repositioned     Hand Dominance Right   Extremity/Trunk Assessment Upper Extremity Assessment Upper Extremity Assessment: Overall WFL for tasks assessed;RUE deficits/detail RUE: Unable to fully assess due to immobilization       Cervical / Trunk Assessment Cervical / Trunk Assessment: Normal   Communication Communication Communication: No difficulties   Cognition Arousal/Alertness: Awake/alert Behavior During Therapy: WFL for tasks assessed/performed Overall Cognitive Status: Within Functional Limits for tasks assessed                                     General Comments       Exercises Other Exercises Other Exercises: pt instructed on Passive protocol of R shoulder ER 10 degrees, FE 60 degrees, ABD 45 degrees and dangle Other Exercises: pt insrtucted on ROM of R hand/wrist/elbow   Shoulder Instructions Shoulder Instructions Donning/doffing shirt without moving shoulder: Supervision/safety Method for sponge bathing under operated UE: Minimal assistance Donning/doffing sling/immobilizer: Minimal assistance Correct positioning of sling/immobilizer: Minimal assistance ROM for elbow, wrist and digits of operated UE: Supervision/safety Sling wearing schedule (on at all times/off for ADL's):  Independent Proper positioning of operated UE when showering: Supervision/safety Positioning of UE while sleeping: [place] expects to be discharged to:: Private residence Living Arrangements: Spouse/significant other;Children Available Help at Discharge: Family Type of Home: House Home Access: Stairs to enter Technical brewer of Steps: 6 Entrance Stairs-Rails:  Left Home Layout: Two level;Able to live on main level with bedroom/bathroom;Bed/bath upstairs     Bathroom Shower/Tub: Occupational psychologist: Standard     Home Equipment: None          Prior Functioning/Environment Level of Independence: Independent                 OT Problem List: Decreased strength;Pain;Decreased range of motion;Impaired UE functional use      OT Treatment/Interventions:      OT Goals(Current goals can be found in the care plan section) Acute Rehab OT Goals Patient Stated Goal: go home OT Goal Formulation: With patient  OT Frequency:     Barriers to D/C:    no barriers       Co-evaluation              AM-PAC PT "6 Clicks" Daily Activity     Outcome Measure Help from another person eating meals?: None Help from another person taking care of personal grooming?: None Help from another person toileting, which includes using toliet, bedpan, or urinal?: None Help from another person bathing (including washing, rinsing, drying)?: A Little Help from another person to put on and taking off regular upper body clothing?: A Little Help from another person to put on and taking off regular lower body clothing?: A Little 6 Click Score: 21   End of Session Equipment Utilized During Treatment: Other (comment) (R UE sling) Nurse Communication: Other (comment);Mobility status (pt dressed and ready for d/c)  Activity Tolerance: Patient tolerated treatment well Patient left: in chair  OT Visit Diagnosis: Pain;Muscle weakness (generalized) (M62.81) Pain - Right/Left: Right Pain - part of body: Shoulder                Time: 7121-9758 OT Time Calculation (min): 43 min Charges:  OT General Charges $OT Visit: 1 Procedure OT Evaluation $OT Eval Low Complexity: 1 Procedure OT Treatments $Self Care/Home Management : 8-22 mins $Therapeutic Exercise: 8-22 mins G-Codes: OT G-codes **NOT FOR INPATIENT CLASS** Functional Assessment Tool Used:  AM-PAC 6 Clicks Daily Activity;Clinical judgement Functional Limitation: Self care Self Care Current Status (I3254): At least 20 percent but less than 40 percent impaired, limited or restricted Self Care Goal Status (D8264): At least 20 percent but less than 40 percent impaired, limited or restricted Self Care Discharge Status [phone]): At least 20 percent but less than 40 percent impaired, limited or restricted     Britt Bottom 09/05/2016, 9:52 AM

## 2016-09-05 NOTE — Anesthesia Postprocedure Evaluation (Signed)
Anesthesia Post Note  Patient: LEKESHIA KRAM  Procedure(s) Performed: Procedure(s) (LRB): OPEN REDUCTION INTERNAL FIXATION (ORIF) PROXIMAL HUMERUS FRACTURE (Right)     Patient location during evaluation: PACU Anesthesia Type: General and Regional Level of consciousness: awake and alert Pain management: pain level controlled Vital Signs Assessment: post-procedure vital signs reviewed and stable Respiratory status: spontaneous breathing, nonlabored ventilation, respiratory function stable and patient connected to nasal cannula oxygen Cardiovascular status: blood pressure returned to baseline and stable Postop Assessment: no signs of nausea or vomiting Anesthetic complications: no    Last Vitals:  Vitals:   09/04/16 2204 09/05/16 0639  BP: 119/67 (!) 149/64  Pulse: 91 78  Resp: 17 17  Temp: 36.7 C 36.7 C    Last Pain:  Vitals:   09/05/16 0800  TempSrc:   PainSc: 7    Pain Goal: Patients Stated Pain Goal: 5 (09/04/16 1310)               Montez Hageman

## 2016-09-05 NOTE — Plan of Care (Signed)
Problem: Safety: Goal: Ability to remain free from injury will improve Call bell and personal belongings within reach.  Patient voices understanding to call for assistance.   

## 2016-09-05 NOTE — Discharge Instructions (Signed)
° °  Metta Clines. Supple, M.D., F.A.A.O.S. Orthopaedic Surgery Specializing in Arthroscopic and Reconstructive Surgery of the Shoulder and Knee [phone] [address] [place], [place] [address] - Fax [phone]   POST-OP  SHOULDER INSTRUCTIONS  1. Call the office at [phone] to schedule your first post-op appointment 10-14 days from the date of your surgery.  2. The bandage over your incision is waterproof. You may begin showering with this dressing on. You may leave this dressing on until first follow up appointment within 2 weeks. We prefer you leave this dressing in place until follow up however after 5-7 days if you are having itching or skin irritation and would like to remove it you may do so. Go slow and tug at the borders gently to break the bond the dressing has with the skin. At this point if there is no drainage it is okay to go without a bandage or you may cover it with a light guaze and tape. You can also expect significant bruising around your shoulder that will drift down your arm and into your chest wall. This is very normal and should resolve over several days.   3. Wear your sling/immobilizer at all times except to perform the exercises below or to occasionally let your arm dangle by your side to stretch your elbow. You also need to sleep in your sling immobilizer until instructed otherwise.  4. Range of motion to your elbow, wrist, and hand are encouraged 3-5 times daily. Exercise to your hand and fingers helps to reduce swelling you may experience.  5. Utilize ice to the shoulder 3-5 times minimum a day and additionally if you are experiencing pain.  6. Prescriptions for a pain medication and a muscle relaxant are provided for you. It is recommended that if you are experiencing pain that you pain medication alone is not controlling, add the muscle relaxant along with the pain medication which can give additional pain relief. The first 1-2 days is generally the  most severe of your pain and then should gradually decrease. As your pain lessens it is recommended that you decrease your use of the pain medications to an "as needed basis'" only and to always comply with the recommended dosages of the pain medications.  7. Pain medications can produce constipation along with their use. If you experience this, the use of an over the counter stool softener or laxative daily is recommended.     8. For additional questions or concerns, please do not hesitate to call the office. If after hours there is an answering service to forward your concerns to the physician on call.  POST-OP EXERCISES  OK to allow arm to dangle and move elbow wrist and hand

## 2016-09-05 NOTE — Discharge Summary (Signed)
PATIENT ID:      Gabrielle Gonzalez  MRN:     [id number] DOB/AGE:    Jan 23, 1956 / 61 y.o.     DISCHARGE SUMMARY  ADMISSION DATE:    09/04/2016 DISCHARGE DATE:    ADMISSION DIAGNOSIS: Right displaced proximal humerus fracture  Past Medical History:  Diagnosis Date  . Anxiety state, unspecified   . Arthritis   . Depressive disorder, not elsewhere classified   . Diverticulosis   . GERD (gastroesophageal reflux disease)   . Headache, chronic daily   . Hiatal hernia   . Hyperlipidemia   . Hyperplastic colon polyp   . Panic disorder without agoraphobia   . Rosacea   . Urinary incontinence    Stress incontinence    DISCHARGE DIAGNOSIS:   Active Problems:   Proximal humerus fracture   PROCEDURE: Procedure(s): OPEN REDUCTION INTERNAL FIXATION (ORIF) PROXIMAL HUMERUS FRACTURE on 09/04/2016  CONSULTS:    HISTORY:  See H&P in chart.  HOSPITAL COURSE:  Gabrielle Gonzalez is a 61 y.o. admitted on 09/04/2016 with a diagnosis of Right displaced proximal humerus fracture .  They were brought to the operating room on 09/04/2016 and underwent Procedure(s): OPEN REDUCTION INTERNAL FIXATION (ORIF) PROXIMAL HUMERUS FRACTURE.    They were given perioperative antibiotics: Anti-infectives    Start     Dose/Rate Route Frequency Ordered Stop   09/04/16 2000  ceFAZolin (ANCEF) IVPB 2g/100 mL premix     2 g 200 mL/hr over 30 Minutes Intravenous Every 6 hours 09/04/16 1724 09/05/16 1359   09/04/16 1430  ceFAZolin (ANCEF) IVPB 2g/100 mL premix     2 g 200 mL/hr over 30 Minutes Intravenous To [facility] 09/04/16 0911 09/04/16 1410    .  Patient underwent the above named procedure and tolerated it well. The following day they were hemodynamically stable and pain was controlled on oral analgesics. The effects of the block were still in effect  to the operative extremity. OT was ordered and worked with patient per protocol. They were medically and orthopaedically stable for discharge on day  1.    DIAGNOSTIC STUDIES:  RECENT RADIOGRAPHIC STUDIES :  Dg Humerus Right  Result Date: 09/04/2016 CLINICAL DATA:  Fracture repair FLUOROSCOPY TIME:  56 seconds. Images: 3 EXAM: RIGHT HUMERUS - 2+ VIEW; DG C-ARM 61-120 MIN COMPARISON:  None. FINDINGS: A plate has been affixed to the proximal humerus, crossing the fracture site. Hardware is in good position. IMPRESSION: Fracture repair as above. Electronically Signed   By: Gabrielle Gonzalez M.D   On: 09/04/2016 15:48   Dg C-arm 1-60 Min  Result Date: 09/04/2016 CLINICAL DATA:  Fracture repair FLUOROSCOPY TIME:  56 seconds. Images: 3 EXAM: RIGHT HUMERUS - 2+ VIEW; DG C-ARM 61-120 MIN COMPARISON:  None. FINDINGS: A plate has been affixed to the proximal humerus, crossing the fracture site. Hardware is in good position. IMPRESSION: Fracture repair as above. Electronically Signed   By: Gabrielle Gonzalez M.D   On: 09/04/2016 15:48    RECENT VITAL SIGNS:  Patient Vitals for the past 24 hrs:  BP Temp Temp src Pulse Resp SpO2 Height Weight  09/05/16 0639 (!) 149/64 98 F (36.7 C) Oral 78 17 96 % - -  09/04/16 2204 119/67 98.1 F (36.7 C) Oral 91 17 94 % - -  09/04/16 1645 112/76 97.2 F (36.2 C) - 69 18 94 % - -  09/04/16 1630 - - - 66 20 96 % - -  09/04/16 1608 - - -  62 (!) 24 94 % - -  09/04/16 1557 111/77 - - 61 20 96 % - -  09/04/16 1544 122/75 97.6 F (36.4 C) - 62 20 93 % - -  09/04/16 1340 - - - 79 18 98 % - -  09/04/16 1335 118/73 - - 68 12 96 % - -  09/04/16 1330 129/80 - - 67 18 98 % - -  09/04/16 1306 (!) 115/95 98.3 F (36.8 C) Oral 62 18 98 % 5\' 2"  (1.575 m) 63.5 kg (140 lb)  .  RECENT EKG RESULTS:   No orders found for this or any previous visit.  DISCHARGE INSTRUCTIONS:    DISCHARGE MEDICATIONS:   Allergies as of 09/05/2016      Reactions   Penicillins    PATIENT HAS HAD ANCEF WITHOUT REACTIONS > > > OK TO GIVE ANCEF WHEN ORDERED, PER MD < < <   09/04/16 Has patient had a PCN reaction causing immediate rash,  facial/tongue/throat swelling, SOB or lightheadedness with hypotension: Unknown Has patient had a PCN reaction causing severe rash involving mucus membranes or skin necrosis: Unknown Has patient had a PCN reaction that required hospitalization: Unknown Has patient had a PCN reaction occurring within the last 10 years: No If all of the above an   Erythromycin Nausea And Vomiting   Lodine [etodolac] Other (See Comments)   GI burning       Medication List    TAKE these medications   acetaminophen 500 MG tablet Commonly known as:  TYLENOL Take 1,000 mg by mouth every 6 (six) hours as needed for moderate pain.   AMBULATORY NON FORMULARY MEDICATION Medication Name:  Nitroglycerin ointment every 8 hours for 6-8 weeks What changed:  how much to take  how to take this  when to take this  reasons to take this  additional instructions   diazepam 5 MG tablet Commonly known as:  VALIUM Take 2.5-5 mg by mouth every 8 (eight) hours as needed for muscle spasms.   gemfibrozil 600 MG tablet Commonly known as:  LOPID Take 600 mg by mouth 2 (two) times daily.   Magnesium 250 MG Tabs Take 250 mg by mouth daily.   multivitamin-lutein Caps capsule Take 1 capsule by mouth daily.   omeprazole 40 MG capsule Commonly known as:  PRILOSEC Take 1 capsule (40 mg total) by mouth daily.   oxyCODONE-acetaminophen 5-325 MG tablet Commonly known as:  PERCOCET/ROXICET Take 1 tablet by mouth every 4 (four) hours as needed for severe pain.   ranitidine 150 MG tablet Commonly known as:  ZANTAC Take 1 tablet (150 mg total) by mouth daily. After dinner   SOOLANTRA 1 % Crea Generic drug:  Ivermectin Apply 1 application topically daily. To face   venlafaxine 75 MG tablet Commonly known as:  EFFEXOR Take 75 mg by mouth daily.   VITAMIN B COMPLEX PO Take 1 capsule by mouth daily.   Vitamin D3 2000 units capsule Take 2,000 Units by mouth daily.       FOLLOW UP VISIT:    DISCHARGE TO:  Home  DISPOSITION: Good  DISCHARGE CONDITION:  Good   Devika Dragovich for Dr. Justice Britain 09/05/2016, 8:19 AM
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
## 2016-12-29 ENCOUNTER — Other Ambulatory Visit: Payer: Self-pay | Admitting: Gastroenterology

## 2016-12-29 DIAGNOSIS — K219 Gastro-esophageal reflux disease without esophagitis: Secondary | ICD-10-CM

## 2016-12-29 DIAGNOSIS — R1310 Dysphagia, unspecified: Secondary | ICD-10-CM

## 2017-01-23 ENCOUNTER — Ambulatory Visit (INDEPENDENT_AMBULATORY_CARE_PROVIDER_SITE_OTHER): Payer: 59

## 2017-01-23 ENCOUNTER — Other Ambulatory Visit: Payer: Self-pay | Admitting: Podiatry

## 2017-01-23 ENCOUNTER — Ambulatory Visit (INDEPENDENT_AMBULATORY_CARE_PROVIDER_SITE_OTHER): Payer: 59 | Admitting: Podiatry

## 2017-01-23 ENCOUNTER — Encounter: Payer: Self-pay | Admitting: Podiatry

## 2017-01-23 DIAGNOSIS — M2010 Hallux valgus (acquired), unspecified foot: Secondary | ICD-10-CM

## 2017-01-23 DIAGNOSIS — M25572 Pain in left ankle and joints of left foot: Secondary | ICD-10-CM

## 2017-01-23 DIAGNOSIS — M779 Enthesopathy, unspecified: Secondary | ICD-10-CM

## 2017-01-23 DIAGNOSIS — M2012 Hallux valgus (acquired), left foot: Secondary | ICD-10-CM | POA: Diagnosis not present

## 2017-01-23 DIAGNOSIS — M2011 Hallux valgus (acquired), right foot: Secondary | ICD-10-CM

## 2017-01-23 MED ORDER — TRIAMCINOLONE ACETONIDE 10 MG/ML IJ SUSP
10.0000 mg | Freq: Once | INTRAMUSCULAR | Status: AC
  Administered 2017-01-23: 10 mg

## 2017-01-23 NOTE — Patient Instructions (Signed)

## 2017-01-23 NOTE — Progress Notes (Signed)
   Subjective:    Patient ID: Gabrielle Gonzalez, female    DOB: November 23, 1955, 61 y.o.   MRN: [id number]  HPI    Review of Systems  All other systems reviewed and are negative.      Objective:   Physical Exam        Assessment & Plan:

## 2017-01-28 NOTE — Progress Notes (Signed)
Subjective:    Patient ID: Gabrielle Gonzalez, female   DOB: 61 y.o.   MRN: [id number]   HPI patient presents stating that she has a painful bunion deformity left and also has discomfort on the inside of the ankle. States this is been going on for a while and making it hard to walk and she's tried wider shoes for the bunion she's tried padding and other modalities    Review of Systems  All other systems reviewed and are negative.       Objective:  Physical Exam  Constitutional: She appears well-developed and well-nourished.  Cardiovascular: Intact distal pulses.  Pulmonary/Chest: Effort normal.  Musculoskeletal: Normal range of motion.  Neurological: She is alert.  Skin: Skin is warm.  Nursing note and vitals reviewed.  neurovascular status intact muscle strength adequate range of motion within normal limits with patient found to have inflammation of the posterior tibial tendon as it inserts into the navicular left with fluid buildup with moderate depression of the arch and also is found to have structural bunion deformity with enlargement around the first metatarsal redness and discomfort around the first metatarsal head. 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 Patient will be seen back to recheck again in the next several weeks and we did discuss bunion correction  X-rays indicate there is elevation of the intermetatarsal angle left with approximate 15 angle and moderate depression of the arch with no other pathology

## 2017-02-23 ENCOUNTER — Encounter: Payer: Self-pay | Admitting: Gastroenterology

## 2017-02-23 ENCOUNTER — Ambulatory Visit: Payer: 59 | Admitting: Gastroenterology

## 2017-02-23 VITALS — BP 100/68 | HR 79 | Ht 62.0 in | Wt 135.0 lb

## 2017-02-23 DIAGNOSIS — Z800 Family history of malignant neoplasm of digestive organs: Secondary | ICD-10-CM | POA: Diagnosis not present

## 2017-02-23 DIAGNOSIS — Z86010 Personal history of colonic polyps: Secondary | ICD-10-CM | POA: Diagnosis not present

## 2017-02-23 DIAGNOSIS — Z8719 Personal history of other diseases of the digestive system: Secondary | ICD-10-CM

## 2017-02-23 MED ORDER — PEG-KCL-NACL-NASULF-NA ASC-C 100 G PO SOLR: 1.0000 | Freq: Once | ORAL | 0 refills | Status: AC

## 2017-02-23 NOTE — Patient Instructions (Addendum)
You have been scheduled for a colonoscopy. Please follow written instructions given to you at your visit today.  Please pick up your prep supplies at the pharmacy within the next 1-3 days.   Follow up as needed   If you are age 59 or older, your body mass index should be between 23-30. Your Body mass index is 24.69 kg/m. If this is out of the aforementioned range listed, please consider follow up with your Primary Care Provider.  If you are age 32 or younger, your body mass index should be between 19-25. Your Body mass index is 24.69 kg/m. If this is out of the aformentioned range listed, please consider follow up with your Primary Care Provider.

## 2017-02-24 ENCOUNTER — Telehealth: Payer: Self-pay | Admitting: Gastroenterology

## 2017-02-24 NOTE — Telephone Encounter (Signed)
Pharmacy states moviprep is on backorder for patient colon scheduled for 2.4.19. Pharmacy wants to know what to do. Pt had ov on 12.17.18

## 2017-02-25 NOTE — Telephone Encounter (Signed)
Will call pharmacy, It depends on how long it takes to get it in

## 2017-02-27 NOTE — Progress Notes (Signed)
Gabrielle Gonzalez    [id number]    [birth year]  Primary Care Physician:Jones, Sherrill Raring, NP  Referring Physician: Berkley Harvey, NP [place], [place] [address]  Chief complaint: History of Diverticulitis  HPI: 61 year old female with family history of colon cancer in the father at age 60 of after recent episode of diverticulitis.  She was treated with Cipro and Flagyl for a week with resolution of abdominal pain and nausea.  She denies any fever, vomiting, constipation or diarrhea.  No melena or blood per rectum  EGD 06/07/2013 showed no evidence of stricture. 3 cm hiatal hernia Colonoscopy 06/07/2013 with removal of 2 small sessile polyps one of which was sessile serrated adenoma  Outpatient Encounter Medications as of 02/23/2017  Medication Sig  . acetaminophen (TYLENOL) 500 MG tablet Take 1,000 mg by mouth every 6 (six) hours as needed for moderate pain.  [facility] AMBULATORY NON FORMULARY MEDICATION Medication Name:  Nitroglycerin ointment every 8 hours for 6-8 weeks (Patient taking differently: Place 1 application rectally daily as needed (rectal fissure flare). Medication Name:  Nitroglycerin ointment)  . B Complex Vitamins (VITAMIN B COMPLEX PO) Take 1 capsule by mouth daily.  . Cholecalciferol (VITAMIN D3) 2000 units capsule Take 2,000 Units by mouth daily.  Marland Kitchen gemfibrozil (LOPID) 600 MG tablet Take 600 mg by mouth 2 (two) times daily.   . Ivermectin (SOOLANTRA) 1 % CREA Apply topically.  . Magnesium 250 MG TABS Take 250 mg by mouth daily.   . multivitamin-lutein (OCUVITE-LUTEIN) CAPS Take 1 capsule by mouth daily.  Mar[facility] Kitchen omeprazole (PRILOSEC) 40 MG capsule TAKE 1 CAPSULE(40 MG) BY MOUTH DAILY  . venlafaxine (EFFEXOR) 75 MG tablet Take 75 mg by mouth daily.    . diazepam (VALIUM) 5 MG tablet Take 2.5-5 mg by mouth every 8 (eight) hours as needed for muscle spasms.  Mar[facility] Kitchen oxyCODONE-acetaminophen (PERCOCET/ROXICET) 5-325 MG tablet Take 1 tablet by mouth every 4  (four) hours as needed for severe pain.  . [EXPIRED] peg 3350 powder (MOVIPREP) 100 g SOLR Take 1 kit (200 g total) by mouth once for 1 dose.  . [DISCONTINUED] Ivermectin (SOOLANTRA) 1 % CREA Apply 1 application topically daily. To face  . [DISCONTINUED] ranitidine (ZANTAC) 150 MG tablet Take 1 tablet (150 mg total) by mouth daily. After dinner   Facility-Administered Encounter Medications as of 02/23/2017  Medication  . 0.9 %  sodium chloride infusion    Allergies as of 02/23/2017 - Review Complete 02/23/2017  Allergen Reaction Noted  . Penicillins  08/14/2010  . Erythromycin Nausea And Vomiting 08/14/2010  . Lodine [etodolac] Other (See Comments) 08/14/2010    Past Medical History:  Diagnosis Date  . Anxiety state, unspecified   . Arthritis   . Depressive disorder, not elsewhere classified   . Diverticulosis   . GERD (gastroesophageal reflux disease)   . Headache, chronic daily   . Hiatal hernia   . Hyperlipidemia   . Hyperplastic colon polyp   . Panic disorder without agoraphobia   . Rosacea   . Urinary incontinence    Stress incontinence    Past Surgical History:  Procedure Laterality Date  . CESAREAN SECTION    . COLONOSCOPY    . FOOT SURGERY    . ORIF HUMERUS FRACTURE Right 09/04/2016   Procedure: OPEN REDUCTION INTERNAL FIXATION (ORIF) PROXIMAL HUMERUS FRACTURE;  Surgeon: Justice Britain, MD;  Location: [facility];  Service: Orthopedics;  Laterality: Right;  Family History  Problem Relation Age of Onset  . Prostate cancer Father   . Diabetes Father   . Colon cancer Father   . Liver cancer Father        mets from colon  . Hypertension Mother   . Other Mother        Carcinoid tumor of intestine  . Hypertension Sister     Social History   Socioeconomic History  . Marital status: Married    Spouse name: Not on file  . Number of children: 2  . Years of education: Not on file  . Highest education level: Not on file  Social Needs  . Financial resource  strain: Not on file  . Food insecurity - worry: Not on file  . Food insecurity - inability: Not on file  . Transportation needs - medical: Not on file  . Transportation needs - non-medical: Not on file  Occupational History  . Occupation: Best boy: [employer]  Tobacco Use  . Smoking status: Never Smoker  . Smokeless tobacco: Never Used  Substance and Sexual Activity  . Alcohol use: Yes    Comment: very rare  . Drug use: No  . Sexual activity: Yes    Birth control/protection: Condom  Other Topics Concern  . Not on file  Social History Narrative  . Not on file      Review of systems: Review of Systems  Constitutional: Negative for fever and chills.  HENT: Negative.   Eyes: Negative for blurred vision.  Respiratory: Negative for cough, shortness of breath and wheezing.   Cardiovascular: Negative for chest pain and palpitations.  Gastrointestinal: as per HPI Genitourinary: Negative for dysuria, urgency, frequency and hematuria.  Musculoskeletal: Negative for myalgias, back pain and joint pain.  Skin: Negative for itching and rash.  Neurological: Negative for dizziness, tremors, focal weakness, seizures and loss of consciousness.  Endo/Heme/Allergies: Positive for seasonal allergies.  Psychiatric/Behavioral: Negative for depression, suicidal ideas and hallucinations.  All other systems reviewed and are negative.   Physical Exam: Vitals:   02/23/17 1357  BP: 100/68  Pulse: 79   Body mass index is 24.69 kg/m. Gen:      No acute distress HEENT:  EOMI, sclera anicteric Neck:     No masses; no thyromegaly Lungs:    Clear to auscultation bilaterally; normal respiratory effort CV:         Regular rate and rhythm; no murmurs Abd:      + bowel sounds; soft, non-tender; no palpable masses, no distension Ext:    No edema; adequate peripheral perfusion Skin:      Warm and dry; no rash Neuro: alert and oriented x 3 Psych: normal mood and affect  Data  Reviewed:  Reviewed labs, radiology imaging, old records and pertinent past GI work up   Assessment and Plan/Recommendations:  61 year old female with family history of colon cancer in her father in his 37s, personal history of adenomatous colon polyps, last colonoscopy in March 2015 here for follow-up status post recent episode of diverticulitis Her symptoms resolved with oral antibiotics Patient is extremely anxious and is worried about missed polyps /neoplastic lesions and is requesting colonoscopy Will schedule colonoscopy for evaluation The risks and benefits as well as alternatives of endoscopic procedure(s) have been discussed and reviewed. All questions answered. The patient agrees to proceed.    Damaris Hippo , MD [phone] Mon-Fri 8a-5p [phone] after 5p, weekends, holidays  CC: Berkley Harvey, NP

## 2017-03-10 HISTORY — PX: COLONOSCOPY: SHX174

## 2017-04-13 ENCOUNTER — Encounter: Payer: Self-pay | Admitting: Gastroenterology

## 2017-04-13 ENCOUNTER — Encounter: Payer: 59 | Admitting: Gastroenterology

## 2017-04-20 ENCOUNTER — Ambulatory Visit (AMBULATORY_SURGERY_CENTER): Payer: 59 | Admitting: Gastroenterology

## 2017-04-20 ENCOUNTER — Encounter: Payer: Self-pay | Admitting: Gastroenterology

## 2017-04-20 ENCOUNTER — Other Ambulatory Visit: Payer: Self-pay

## 2017-04-20 VITALS — BP 110/70 | HR 53 | Temp 98.6°F | Resp 16 | Ht 62.0 in | Wt 135.0 lb

## 2017-04-20 DIAGNOSIS — D125 Benign neoplasm of sigmoid colon: Secondary | ICD-10-CM

## 2017-04-20 DIAGNOSIS — Z86010 Personal history of colonic polyps: Secondary | ICD-10-CM | POA: Diagnosis present

## 2017-04-20 DIAGNOSIS — Z800 Family history of malignant neoplasm of digestive organs: Secondary | ICD-10-CM

## 2017-04-20 DIAGNOSIS — D124 Benign neoplasm of descending colon: Secondary | ICD-10-CM | POA: Diagnosis not present

## 2017-04-20 DIAGNOSIS — D123 Benign neoplasm of transverse colon: Secondary | ICD-10-CM | POA: Diagnosis not present

## 2017-04-20 MED ORDER — SODIUM CHLORIDE 0.9 % IV SOLN: 500.0000 mL | Freq: Once | INTRAVENOUS | Status: DC

## 2017-04-20 NOTE — Patient Instructions (Signed)
**   Handouts given on polyps and diverticulosis **   YOU HAD AN ENDOSCOPIC PROCEDURE TODAY AT THE [facility]:   Refer to the procedure report that was given to you for any specific questions about what was found during the examination.  If the procedure report does not answer your questions, please call your gastroenterologist to clarify.  If you requested that your care partner not be given the details of your procedure findings, then the procedure report has been included in a sealed envelope for you to review at your convenience later.  YOU SHOULD EXPECT: Some feelings of bloating in the abdomen. Passage of more gas than usual.  Walking can help get rid of the air that was put into your GI tract during the procedure and reduce the bloating. If you had a lower endoscopy (such as a colonoscopy or flexible sigmoidoscopy) you may notice spotting of blood in your stool or on the toilet paper. If you underwent a bowel prep for your procedure, you may not have a normal bowel movement for a few days.  Please Note:  You might notice some irritation and congestion in your nose or some drainage.  This is from the oxygen used during your procedure.  There is no need for concern and it should clear up in a day or so.  SYMPTOMS TO REPORT IMMEDIATELY:   Following lower endoscopy (colonoscopy or flexible sigmoidoscopy):  Excessive amounts of blood in the stool  Significant tenderness or worsening of abdominal pains  Swelling of the abdomen that is new, acute  Fever of 100F or higher  For urgent or emergent issues, a gastroenterologist can be reached at any hour by calling [phone].   DIET:  We do recommend a small meal at first, but then you may proceed to your regular diet.  Drink plenty of fluids but you should avoid alcoholic beverages for 24 hours.  ACTIVITY:  You should plan to take it easy for the rest of today and you should NOT DRIVE or use heavy machinery until tomorrow (because  of the sedation medicines used during the test).    FOLLOW UP: Our staff will call the number listed on your records the next business day following your procedure to check on you and address any questions or concerns that you may have regarding the information given to you following your procedure. If we do not reach you, we will leave a message.  However, if you are feeling well and you are not experiencing any problems, there is no need to return our call.  We will assume that you have returned to your regular daily activities without incident.  If any biopsies were taken you will be contacted by phone or by letter within the next 1-3 weeks.  Please call us at [phone] if you have not heard about the biopsies in 3 weeks.    SIGNATURES/CONFIDENTIALITY: You and/or your care partner have signed paperwork which will be entered into your electronic medical record.  These signatures attest to the fact that that the information above on your After Visit Summary has been reviewed and is understood.  Full responsibility of the confidentiality of this discharge information lies with you and/or your care-partner. 

## 2017-04-20 NOTE — Progress Notes (Signed)
Report to PACU, RN, vss, BBS= Clear.  

## 2017-04-20 NOTE — Progress Notes (Signed)
Called to room to assist during endoscopic procedure.  Patient ID and intended procedure confirmed with present staff. Received instructions for my participation in the procedure from the performing physician.  

## 2017-04-20 NOTE — Op Note (Signed)
[facility] Patient Name: Alysen Smylie Procedure Date: 04/20/2017 2:07 PM MRN: [id number] Endoscopist: Mauri Pole , MD Age: 62 Referring MD:  Date of Birth: 20-Jan-1956 Gender: Female Account #: [id number] Procedure:                Colonoscopy Indications:              Screening patient at increased risk: Family history                            of 1st-degree relative with colorectal cancer at                            age 20 years (or older), Surveillance: Personal                            history of adenomatous polyps on last colonoscopy >                            3 years ago Medicines:                Monitored Anesthesia Care Procedure:                Pre-Anesthesia Assessment:                           - Prior to the procedure, a History and Physical                            was performed, and patient medications and                            allergies were reviewed. The patient's tolerance of                            previous anesthesia was also reviewed. The risks                            and benefits of the procedure and the sedation                            options and risks were discussed with the patient.                            All questions were answered, and informed consent                            was obtained. Prior Anticoagulants: The patient has                            taken no previous anticoagulant or antiplatelet                            agents. ASA Grade Assessment: II - A patient with  mild systemic disease. After reviewing the risks                            and benefits, the patient was deemed in                            satisfactory condition to undergo the procedure.                           After obtaining informed consent, the colonoscope                            was passed under direct vision. Throughout the                            procedure, the patient's blood pressure, pulse,  and                            oxygen saturations were monitored continuously. The                            Colonoscope was introduced through the anus and                            advanced to the the cecum, identified by                            appendiceal orifice and ileocecal valve. The                            colonoscopy was performed without difficulty. The                            patient tolerated the procedure well. The quality                            of the bowel preparation was excellent. The                            ileocecal valve, appendiceal orifice, and rectum                            were photographed. Scope In: 2:19:06 PM Scope Out: 2:38:52 PM Scope Withdrawal Time: 0 hours 12 minutes 40 seconds  Total Procedure Duration: 0 hours 19 minutes 46 seconds  Findings:                 The perianal and digital rectal examinations were                            normal.                           Four sessile polyps were found in the sigmoid  colon, descending colon and transverse colon. The                            polyps were 4 to 6 mm in size. These polyps were                            removed with a cold snare. Resection and retrieval                            were complete.                           A 3 mm polypoid lesion was found in the sigmoid                            colon, appears to be arising from a diverticula.                            The polyp was semi-pedunculated. The polyp was                            removed with a cold biopsy forceps. Resection and                            retrieval were complete.                           Multiple small and large-mouthed diverticula were                            found in the sigmoid colon, descending colon and                            ascending colon. There was narrowing of the colon                            in association with the diverticular opening.                             Peri-diverticular erythema was seen. There was                            evidence of an impacted diverticulum. There was no                            evidence of diverticular bleeding.                           Non-bleeding internal hemorrhoids were found during                            retroflexion. The hemorrhoids were medium-sized.  The exam was otherwise without abnormality. Complications:            No immediate complications. Estimated Blood Loss:     Estimated blood loss was minimal. Impression:               - Four 4 to 6 mm polyps in the sigmoid colon, in                            the descending colon and in the transverse colon,                            removed with a cold snare. Resected and retrieved.                           - One 3 mm polyp in the sigmoid colon, removed with                            a cold biopsy forceps. Resected and retrieved.                           - Severe diverticulosis in the sigmoid colon, in                            the descending colon and in the ascending colon.                            There was narrowing of the colon in association                            with the diverticular opening. Peri-diverticular                            erythema was seen. There was evidence of an                            impacted diverticulum. There was no evidence of                            diverticular bleeding.                           - Non-bleeding internal hemorrhoids.                           - The examination was otherwise normal. Recommendation:           - Patient has a contact number available for                            emergencies. The signs and symptoms of potential                            delayed complications were discussed with the  patient. Return to normal activities tomorrow.                            Written discharge instructions were provided to the                             patient.                           - Resume previous diet.                           - Continue present medications.                           - Await pathology results.                           - Repeat colonoscopy in 3 years for surveillance                            based on pathology results. Mauri Pole, MD 04/20/2017 2:44:42 PM This report has been signed electronically.

## 2017-04-21 ENCOUNTER — Telehealth: Payer: Self-pay

## 2017-04-21 NOTE — Telephone Encounter (Signed)
  Follow up Call-  Call back number 04/20/2017 07/07/2016 07/07/2016  Post procedure Call Back phone  # [phone]  Permission to leave phone message Yes Yes -  Some recent data might be hidden     Patient questions:  Do you have a fever, pain , or abdominal swelling? No. Pain Score  0 *  Have you tolerated food without any problems? Yes.    Have you been able to return to your normal activities? Yes.    Do you have any questions about your discharge instructions: Diet   No. Medications  No. Follow up visit  No.  Do you have questions or concerns about your Care? No.  Actions: * If pain score is 4 or above: No action needed, pain <4.

## 2017-04-22 NOTE — Telephone Encounter (Signed)
Patient had her colonoscopy as scheduled

## 2017-04-24 ENCOUNTER — Encounter: Payer: Self-pay | Admitting: Gastroenterology

## 2017-05-22 ENCOUNTER — Other Ambulatory Visit: Payer: Self-pay | Admitting: Obstetrics

## 2017-05-22 DIAGNOSIS — Z139 Encounter for screening, unspecified: Secondary | ICD-10-CM

## 2017-05-25 DIAGNOSIS — K219 Gastro-esophageal reflux disease without esophagitis: Secondary | ICD-10-CM

## 2017-05-25 DIAGNOSIS — J302 Other seasonal allergic rhinitis: Secondary | ICD-10-CM | POA: Insufficient documentation

## 2017-05-25 HISTORY — DX: Gastro-esophageal reflux disease without esophagitis: K21.9

## 2017-05-29 ENCOUNTER — Ambulatory Visit
Admission: RE | Admit: 2017-05-29 | Discharge: 2017-05-29 | Disposition: A | Discharge status: Home / Self Care | Payer: 59 | Source: Ambulatory Visit | Attending: Nurse Practitioner | Admitting: Nurse Practitioner

## 2017-05-29 ENCOUNTER — Other Ambulatory Visit: Payer: Self-pay | Admitting: Nurse Practitioner

## 2017-05-29 DIAGNOSIS — M5416 Radiculopathy, lumbar region: Secondary | ICD-10-CM

## 2017-06-10 ENCOUNTER — Ambulatory Visit
Admission: RE | Admit: 2017-06-10 | Discharge: 2017-06-10 | Disposition: A | Discharge status: Home / Self Care | Payer: 59 | Source: Ambulatory Visit | Attending: Obstetrics | Admitting: Obstetrics

## 2017-06-10 DIAGNOSIS — Z139 Encounter for screening, unspecified: Secondary | ICD-10-CM

## 2017-09-04 ENCOUNTER — Other Ambulatory Visit: Payer: Self-pay | Admitting: Gastroenterology

## 2017-09-04 DIAGNOSIS — R1310 Dysphagia, unspecified: Secondary | ICD-10-CM

## 2017-09-04 DIAGNOSIS — K219 Gastro-esophageal reflux disease without esophagitis: Secondary | ICD-10-CM

## 2017-10-23 ENCOUNTER — Other Ambulatory Visit: Payer: Self-pay | Admitting: Nurse Practitioner

## 2017-10-23 DIAGNOSIS — E2839 Other primary ovarian failure: Secondary | ICD-10-CM

## 2017-12-14 ENCOUNTER — Other Ambulatory Visit: Payer: 59

## 2018-01-07 ENCOUNTER — Ambulatory Visit: Payer: 59 | Admitting: Podiatry

## 2018-01-07 ENCOUNTER — Ambulatory Visit: Payer: 59

## 2018-01-07 ENCOUNTER — Encounter: Payer: Self-pay | Admitting: Podiatry

## 2018-01-07 DIAGNOSIS — B351 Tinea unguium: Secondary | ICD-10-CM | POA: Diagnosis not present

## 2018-01-07 DIAGNOSIS — M779 Enthesopathy, unspecified: Secondary | ICD-10-CM

## 2018-01-07 DIAGNOSIS — M79675 Pain in left toe(s): Secondary | ICD-10-CM

## 2018-01-07 MED ORDER — TRIAMCINOLONE ACETONIDE 10 MG/ML IJ SUSP
10.0000 mg | Freq: Once | INTRAMUSCULAR | Status: AC
  Administered 2018-01-07: 10 mg

## 2018-01-07 NOTE — Progress Notes (Signed)
Subjective:   Patient ID: Gabrielle Gonzalez, female   DOB: 62 y.o.   MRN: [id number]   HPI Patient presents with a flareup around the big toe joint left and due to go on a big trip and also does have thickened nails and went on an oral antifungal and wants to know if there is anything we can do   ROS      Objective:  Physical Exam  Neurovascular status intact with inflammation pain around the first MPJ left with patient understanding that ultimately is can require surgery and she wants to do this but needs to find the right time and just wants treatment to help with the acute development of it along with thick yellow brittle nails 1-5 right over left foot     Assessment:  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  Reappoint as needed and ultimately will require surgery on the left  X-ray indicates there is spurring enlargement around the head of the first metatarsal left with narrowing of the joint which I did appraise the patient

## 2018-01-09 ENCOUNTER — Other Ambulatory Visit: Payer: Self-pay | Admitting: Gastroenterology

## 2018-01-09 DIAGNOSIS — K219 Gastro-esophageal reflux disease without esophagitis: Secondary | ICD-10-CM

## 2018-01-09 DIAGNOSIS — R1310 Dysphagia, unspecified: Secondary | ICD-10-CM

## 2018-01-21 ENCOUNTER — Ambulatory Visit
Admission: RE | Admit: 2018-01-21 | Discharge: 2018-01-21 | Disposition: A | Discharge status: Home / Self Care | Payer: 59 | Source: Ambulatory Visit | Attending: Nurse Practitioner | Admitting: Nurse Practitioner

## 2018-01-21 DIAGNOSIS — E2839 Other primary ovarian failure: Secondary | ICD-10-CM

## 2018-03-13 ENCOUNTER — Other Ambulatory Visit: Payer: Self-pay | Admitting: Gastroenterology

## 2018-03-13 DIAGNOSIS — K219 Gastro-esophageal reflux disease without esophagitis: Secondary | ICD-10-CM

## 2018-03-13 DIAGNOSIS — R1310 Dysphagia, unspecified: Secondary | ICD-10-CM

## 2018-12-23 DIAGNOSIS — M810 Age-related osteoporosis without current pathological fracture: Secondary | ICD-10-CM | POA: Insufficient documentation

## 2018-12-24 ENCOUNTER — Other Ambulatory Visit: Payer: Self-pay | Admitting: Nurse Practitioner

## 2018-12-24 DIAGNOSIS — Z1231 Encounter for screening mammogram for malignant neoplasm of breast: Secondary | ICD-10-CM

## 2019-02-21 ENCOUNTER — Ambulatory Visit
Admission: RE | Admit: 2019-02-21 | Discharge: 2019-02-21 | Disposition: A | Discharge status: Home / Self Care | Payer: BC Managed Care – PPO | Source: Ambulatory Visit | Attending: Nurse Practitioner | Admitting: Nurse Practitioner

## 2019-02-21 ENCOUNTER — Other Ambulatory Visit: Payer: Self-pay

## 2019-02-21 DIAGNOSIS — Z1231 Encounter for screening mammogram for malignant neoplasm of breast: Secondary | ICD-10-CM

## 2019-03-18 ENCOUNTER — Other Ambulatory Visit: Payer: Self-pay | Admitting: Gastroenterology

## 2019-03-18 DIAGNOSIS — R1310 Dysphagia, unspecified: Secondary | ICD-10-CM

## 2019-03-18 DIAGNOSIS — K219 Gastro-esophageal reflux disease without esophagitis: Secondary | ICD-10-CM

## 2019-05-17 ENCOUNTER — Telehealth: Payer: Self-pay | Admitting: Gastroenterology

## 2019-05-17 NOTE — Telephone Encounter (Signed)
Dr. Silverio Decamp, previous 2019 colon report stated that pt should repeat colon in 3 years.  Recall states 2025.  Please advise.

## 2019-05-18 NOTE — Telephone Encounter (Signed)
I believe the recall was entered wrong.  The colonoscopy report and the letter recommend 3-year recall, she is due in 2022.  Thank you

## 2019-05-18 NOTE — Telephone Encounter (Signed)
Recall updated and pt informed of recall date.

## 2019-07-21 ENCOUNTER — Other Ambulatory Visit: Payer: Self-pay

## 2019-07-21 ENCOUNTER — Encounter: Payer: Self-pay | Admitting: Podiatry

## 2019-07-21 ENCOUNTER — Ambulatory Visit (INDEPENDENT_AMBULATORY_CARE_PROVIDER_SITE_OTHER): Payer: BC Managed Care – PPO

## 2019-07-21 ENCOUNTER — Other Ambulatory Visit: Payer: Self-pay | Admitting: Podiatry

## 2019-07-21 ENCOUNTER — Ambulatory Visit: Payer: 59 | Admitting: Podiatry

## 2019-07-21 VITALS — Temp 97.6°F

## 2019-07-21 DIAGNOSIS — M2011 Hallux valgus (acquired), right foot: Secondary | ICD-10-CM

## 2019-07-21 DIAGNOSIS — M2012 Hallux valgus (acquired), left foot: Secondary | ICD-10-CM

## 2019-07-21 DIAGNOSIS — M779 Enthesopathy, unspecified: Secondary | ICD-10-CM | POA: Diagnosis not present

## 2019-07-21 DIAGNOSIS — M778 Other enthesopathies, not elsewhere classified: Secondary | ICD-10-CM | POA: Diagnosis not present

## 2019-07-21 DIAGNOSIS — M2022 Hallux rigidus, left foot: Secondary | ICD-10-CM

## 2019-07-21 DIAGNOSIS — M25572 Pain in left ankle and joints of left foot: Secondary | ICD-10-CM

## 2019-07-21 DIAGNOSIS — M2041 Other hammer toe(s) (acquired), right foot: Secondary | ICD-10-CM

## 2019-07-21 NOTE — Progress Notes (Signed)
Subjective:   Patient ID: Gabrielle Gonzalez, female   DOB: 64 y.o.   MRN: [id number]   HPI Patient presents stating that my left big toe joint is really sore and I know I am getting need surgery and also the second and third digits on my right foot are giving me problems and making it hard for me to wear shoe gear comfortably.  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  I allowed patient to read consent form for procedures going over all possible complications and alternative treatments and after extensive review patient signed consent form understanding risk.  Patient wants surgery and is scheduled for outpatient procedure at the current time and is encouraged to call with questions prior to the surgery that will be necessary.  At this point I went ahead and I dispensed air fracture walker as she is getting need this during the postoperative.  I want her to get used to it prior to surgery so that it fits her comfortably and I encouraged her to call anytime she something, prior to procedure  X-rays indicate there is arthritis around the first MPJ left with elevation of the head with digital deformity digits 2 and 3 right and history of implantation  procedure first metatarsal right foot

## 2019-07-21 NOTE — Patient Instructions (Signed)
Pre-Operative Instructions  Congratulations, you have decided to take an important step towards improving your quality of life.  You can be assured that the doctors and staff at [facility] will be with you every step of the way.  Here are some important things you should know:  1. Plan to be at the surgery center/hospital at least 1 (one) hour prior to your scheduled time, unless otherwise directed by the surgical center/hospital staff.  You must have a responsible adult accompany you, remain during the surgery and drive you home.  Make sure you have directions to the surgical center/hospital to ensure you arrive on time. 2. If you are having surgery at [facility] or [facility], you will need a copy of your medical history and physical form from your family physician within one month prior to the date of surgery. We will give you a form for your primary physician to complete.  3. We make every effort to accommodate the date you request for surgery.  However, there are times where surgery dates or times have to be moved.  We will contact you as soon as possible if a change in schedule is required.   4. No aspirin/ibuprofen for one week before surgery.  If you are on aspirin, any non-steroidal anti-inflammatory medications (Mobic, Aleve, Ibuprofen) should not be taken seven (7) days prior to your surgery.  You make take Tylenol for pain prior to surgery.  5. Medications - If you are taking daily heart and blood pressure medications, seizure, reflux, allergy, asthma, anxiety, pain or diabetes medications, make sure you notify the surgery center/hospital before the day of surgery so they can tell you which medications you should take or avoid the day of surgery. 6. No food or drink after midnight the night before surgery unless directed otherwise by surgical center/hospital staff. 7. No alcoholic beverages 24-hours prior to surgery.  No smoking 24-hours prior or 24-hours after  surgery. 8. Wear loose pants or shorts. They should be loose enough to fit over bandages, boots, and casts. 9. Don't wear slip-on shoes. Sneakers are preferred. 10. Bring your boot with you to the surgery center/hospital.  Also bring crutches or a walker if your physician has prescribed it for you.  If you do not have this equipment, it will be provided for you after surgery. 11. If you have not been contacted by the surgery center/hospital by the day before your surgery, call to confirm the date and time of your surgery. 12. Leave-time from work may vary depending on the type of surgery you have.  Appropriate arrangements should be made prior to surgery with your employer. 13. Prescriptions will be provided immediately following surgery by your doctor.  Fill these as soon as possible after surgery and take the medication as directed. Pain medications will not be refilled on weekends and must be approved by the doctor. 14. Remove nail polish on the operative foot and avoid getting pedicures prior to surgery. 15. Wash the night before surgery.  The night before surgery wash the foot and leg well with water and the antibacterial soap provided. Be sure to pay special attention to beneath the toenails and in between the toes.  Wash for at least three (3) minutes. Rinse thoroughly with water and dry well with a towel.  Perform this wash unless told not to do so by your physician.  Enclosed: 1 Ice pack (please put in freezer the night before surgery)   1 Hibiclens skin cleaner     Pre-op instructions  If you have any questions regarding the instructions, please do not hesitate to call our office.  [place]: [address], , [place] [address] -- [phone]  [place]: [address] [place], [place] [address] -- [phone]  [place]: [address], [place], [place] [address] -- [phone]   Website: [email or web address] 

## 2019-11-17 ENCOUNTER — Telehealth: Payer: Self-pay

## 2019-11-17 NOTE — Telephone Encounter (Signed)
DOS 11/22/19  [place] BUNIONECTOMY LT - [address] [place] REPAIR 2,3 RT - [id number]  [employer]BS EFFECTIVE DATE - 06/09/2019  PLAN DEDUCTIBLE - $6000.00 W/ $6000.00 REMAINING OUT OF POCKET - $8550.00 W/ $8297.75 REMAINING COPAY $0.00 COINSURANCE - 40% PER SERVICE YEAR  NO AUTH REQUIRED PER WEBSITE

## 2019-11-21 MED ORDER — ONDANSETRON HCL 4 MG PO TABS: 4.0000 mg | ORAL_TABLET | Freq: Three times a day (TID) | ORAL | 0 refills | Status: DC | PRN

## 2019-11-21 MED ORDER — OXYCODONE-ACETAMINOPHEN 10-325 MG PO TABS: 1.0000 | ORAL_TABLET | ORAL | 0 refills | Status: DC | PRN

## 2019-11-21 NOTE — Addendum Note (Signed)
Addended by: Wallene Huh on: 11/21/2019 02:39 PM   Modules accepted: Orders

## 2019-11-22 ENCOUNTER — Encounter: Payer: Self-pay | Admitting: Podiatry

## 2019-11-22 DIAGNOSIS — M2022 Hallux rigidus, left foot: Secondary | ICD-10-CM | POA: Diagnosis not present

## 2019-11-22 DIAGNOSIS — M2041 Other hammer toe(s) (acquired), right foot: Secondary | ICD-10-CM | POA: Diagnosis not present

## 2019-11-30 ENCOUNTER — Encounter: Payer: BC Managed Care – PPO | Admitting: Podiatry

## 2019-12-05 ENCOUNTER — Other Ambulatory Visit: Payer: Self-pay

## 2019-12-05 ENCOUNTER — Ambulatory Visit (INDEPENDENT_AMBULATORY_CARE_PROVIDER_SITE_OTHER): Payer: BC Managed Care – PPO | Admitting: Podiatry

## 2019-12-05 ENCOUNTER — Encounter: Payer: Self-pay | Admitting: Podiatry

## 2019-12-05 ENCOUNTER — Ambulatory Visit (INDEPENDENT_AMBULATORY_CARE_PROVIDER_SITE_OTHER): Payer: BC Managed Care – PPO

## 2019-12-05 DIAGNOSIS — M2012 Hallux valgus (acquired), left foot: Secondary | ICD-10-CM | POA: Diagnosis not present

## 2019-12-05 DIAGNOSIS — M2011 Hallux valgus (acquired), right foot: Secondary | ICD-10-CM

## 2019-12-05 DIAGNOSIS — M2041 Other hammer toe(s) (acquired), right foot: Secondary | ICD-10-CM

## 2019-12-05 NOTE — Progress Notes (Signed)
Subjective:   Patient ID: Gabrielle Gonzalez, female   DOB: 64 y.o.   MRN: [id number]   HPI Patient presents stating I am doing wonderful after surgery and very pleased with both feet   ROS      Objective:  Physical Exam  Neurovascular status intact negative Bevelyn Buckles' sign noted bilateral with good fixation digits 2 3 right pins in place wound edges well coapted stitches in place and excellent motion first MPJ left after implant procedure with minimal discomfort     Assessment:  Patient is doing well postoperatively bilateral     Plan:  H&P reviewed conditions and x-rays and at this point reapplied sterile dressing continue elevation compression immobilization and reappoint 2 weeks suture removal  X-rays indicate pins are intact second and third digits right with good alignment implant intact left first metatarsal good alignment

## 2019-12-15 ENCOUNTER — Ambulatory Visit (INDEPENDENT_AMBULATORY_CARE_PROVIDER_SITE_OTHER): Payer: BC Managed Care – PPO

## 2019-12-15 ENCOUNTER — Other Ambulatory Visit: Payer: Self-pay

## 2019-12-15 ENCOUNTER — Ambulatory Visit: Payer: BC Managed Care – PPO

## 2019-12-15 ENCOUNTER — Ambulatory Visit (INDEPENDENT_AMBULATORY_CARE_PROVIDER_SITE_OTHER): Payer: BC Managed Care – PPO | Admitting: Podiatry

## 2019-12-15 ENCOUNTER — Encounter: Payer: Self-pay | Admitting: Podiatry

## 2019-12-15 DIAGNOSIS — M2041 Other hammer toe(s) (acquired), right foot: Secondary | ICD-10-CM | POA: Diagnosis not present

## 2019-12-15 DIAGNOSIS — R52 Pain, unspecified: Secondary | ICD-10-CM | POA: Diagnosis not present

## 2019-12-15 DIAGNOSIS — M2100 Valgus deformity, not elsewhere classified, unspecified site: Secondary | ICD-10-CM

## 2019-12-17 NOTE — Progress Notes (Signed)
Subjective:   Patient ID: Gabrielle Gonzalez, female   DOB: 64 y.o.   MRN: [id number]   HPI Patient states very pleased with the left foot and right foot she is getting some abrasion between the big toe second toe but overall doing well with pin fixation intact   ROS      Objective:  Physical Exam  Neurovascular status intact negative Bevelyn Buckles' sign noted with patient's left first MPJ functioning very well with implant doing well with good range of motion no crepitus of the joint and pins in the second and third digit right functioning well with no drainage or pathology.  Slight rubbing between the hallux and second toe right creating slight abrasion     Assessment:  Doing well post surgical intervention bilateral with good alignment noted stitches intact wound edges well coapted and slight abrasion localized between the hallux and second toe right     Plan:  H&P x-rays reviewed and left foot compression stocking dispensed and right we applied padding between the toes and I did apply a splint that went above the ankle to try to hold the second and third digits in a lateral position to open up the area and allow healing to occur.  Reappoint the next several weeks for pin removal or earlier if any other pathology were to occur  X-rays indicate implant in place first MPJ left good alignment and pins in place second and third toes right with good alignment of the digits good healing occurring

## 2019-12-26 ENCOUNTER — Ambulatory Visit (INDEPENDENT_AMBULATORY_CARE_PROVIDER_SITE_OTHER): Payer: BC Managed Care – PPO

## 2019-12-26 ENCOUNTER — Ambulatory Visit (INDEPENDENT_AMBULATORY_CARE_PROVIDER_SITE_OTHER): Payer: BC Managed Care – PPO | Admitting: Podiatry

## 2019-12-26 ENCOUNTER — Encounter: Payer: Self-pay | Admitting: Podiatry

## 2019-12-26 ENCOUNTER — Other Ambulatory Visit: Payer: Self-pay

## 2019-12-26 DIAGNOSIS — M2041 Other hammer toe(s) (acquired), right foot: Secondary | ICD-10-CM

## 2019-12-26 NOTE — Progress Notes (Signed)
Subjective:   Patient ID: Gabrielle Gonzalez, female   DOB: 64 y.o.   MRN: [id number]   HPI Patient presents for pin removal digits 2 3 right stating both her feet are doing great   ROS      Objective:  Physical Exam  Neurovascular status intact with pins in place second and third digits right wound edges well coapted good alignment and excellent range of motion first MPJ left     Assessment:  Doing well both feet with pins in place second digit right     Plan:  H&P x-ray reviewed pins removed right sterile dressing applied begin gradual return to soft shoe gear and reappoint 6 weeks or earlier if needed  X-rays indicate excellent alignment of the digits with good fixation positional component

## 2020-01-07 ENCOUNTER — Ambulatory Visit: Payer: BC Managed Care – PPO | Attending: Internal Medicine

## 2020-01-07 DIAGNOSIS — Z23 Encounter for immunization: Secondary | ICD-10-CM

## 2020-01-07 NOTE — Progress Notes (Signed)
   Covid-19 Vaccination Clinic  Name:  RUPINDER LIVINGSTON    MRN: [id number] DOB: 07-09-1955  01/07/2020  Ms. Rane was observed post Covid-19 immunization for 15 minutes without incident. She was provided with Vaccine Information Sheet and instruction to access the V-Safe system.   Ms. Habermann was instructed to call 911 with any severe reactions post vaccine: Marland Kitchen Difficulty breathing  . Swelling of face and throat  . A fast heartbeat  . A bad rash all over body  . Dizziness and weakness

## 2020-01-16 ENCOUNTER — Ambulatory Visit: Payer: BC Managed Care – PPO | Admitting: Gastroenterology

## 2020-01-16 ENCOUNTER — Encounter: Payer: Self-pay | Admitting: Gastroenterology

## 2020-01-16 VITALS — BP 120/78 | HR 50 | Ht 62.0 in | Wt 136.2 lb

## 2020-01-16 DIAGNOSIS — K219 Gastro-esophageal reflux disease without esophagitis: Secondary | ICD-10-CM | POA: Diagnosis not present

## 2020-01-16 DIAGNOSIS — K449 Diaphragmatic hernia without obstruction or gangrene: Secondary | ICD-10-CM

## 2020-01-16 DIAGNOSIS — R1012 Left upper quadrant pain: Secondary | ICD-10-CM | POA: Diagnosis not present

## 2020-01-16 DIAGNOSIS — R1013 Epigastric pain: Secondary | ICD-10-CM | POA: Diagnosis not present

## 2020-01-16 MED ORDER — OMEPRAZOLE 40 MG PO CPDR: 40.0000 mg | DELAYED_RELEASE_CAPSULE | Freq: Every day | ORAL | 11 refills | Status: DC

## 2020-01-16 NOTE — Progress Notes (Signed)
Gabrielle Gonzalez    [id number]    Dec 06, 1955  Primary Care Physician:Jones, Sherrill Raring, NP  Referring Physician: Berkley Harvey, NP [place],  [place] [address]   Chief complaint: Epigastric abdominal pain  HPI:  64 year old very pleasant female here for follow-up visit with complaints of epigastric abdominal pain radiating to left upper abdomen and to the back.  Pain is sometimes worse after meals.  She is eating mostly bland diet in the past week 2 weeks ago she was taking ibuprofen every 6 hours after foot surgery for few days and she had diarrhea followed by constipation 2 days ago which has since resolved.  She denies any melena or blood per rectum. No vomiting, dysphagia or regurgitation  Has history of diverticulitis, was treated with oral antibiotics in 2018  Colonoscopy April 20, 2017: 5 sessile polyps [3 tubular adenomas] removed, diverticulosis and internal hemorrhoids  Family history positive for colon cancer  EGD 06/07/2013 showed no evidence of stricture.3 cm hiatal hernia Colonoscopy 06/07/2013 with removal of 2 small sessile polyps one of which was sessile serrated adenoma  Outpatient Encounter Medications as of 01/16/2020  Medication Sig  . B Complex Vitamins (VITAMIN B COMPLEX PO) Take 1 capsule by mouth daily.  . Cholecalciferol (VITAMIN D3) 2000 units capsule Take 2,000 Units by mouth daily.  . Magnesium 250 MG TABS Take 250 mg by mouth daily.   . multivitamin-lutein (OCUVITE-LUTEIN) CAPS Take 1 capsule by mouth daily.  Marland Kitchen omeprazole (PRILOSEC) 20 MG capsule Take 40 mg by mouth daily.  Marland Kitchen terbinafine (LAMISIL) 250 MG tablet Take 250 mg by mouth daily.  Marland Kitchen venlafaxine (EFFEXOR) 75 MG tablet Take 75 mg by mouth daily.    . [DISCONTINUED] gemfibrozil (LOPID) 600 MG tablet Take 600 mg by mouth daily.   . [DISCONTINUED] Ivermectin (SOOLANTRA) 1 % CREA Apply topically.  . [DISCONTINUED] omeprazole (PRILOSEC) 40 MG capsule TAKE 1  CAPSULE(40 MG) BY MOUTH DAILY  . [DISCONTINUED] ondansetron (ZOFRAN) 4 MG tablet Take 1 tablet (4 mg total) by mouth every 8 (eight) hours as needed for nausea or vomiting.  . [DISCONTINUED] oxyCODONE-acetaminophen (PERCOCET) 10-325 MG tablet Take 1 tablet by mouth every 4 (four) hours as needed for pain.  . [DISCONTINUED] venlafaxine (EFFEXOR) 75 MG tablet Take 1 tablet by mouth daily.   Facility-Administered Encounter Medications as of 01/16/2020  Medication  . 0.9 %  sodium chloride infusion  . 0.9 %  sodium chloride infusion    Allergies as of 01/16/2020 - Review Complete 01/16/2020  Allergen Reaction Noted  . Penicillins  08/14/2010  . Erythromycin Nausea And Vomiting 08/14/2010  . Lodine [etodolac] Other (See Comments) 08/14/2010    Past Medical History:  Diagnosis Date  . Anxiety state, unspecified   . Arthritis   . Depressive disorder, not elsewhere classified   . Diverticulosis   . GERD (gastroesophageal reflux disease)   . Headache, chronic daily   . Hiatal hernia   . Hyperlipidemia   . Hyperplastic colon polyp   . Panic disorder without agoraphobia   . Rosacea   . Urinary incontinence    Stress incontinence    Past Surgical History:  Procedure Laterality Date  . BREAST BIOPSY Left 2009  . CESAREAN SECTION    . COLONOSCOPY    . FOOT SURGERY    . ORIF HUMERUS FRACTURE Right 09/04/2016   Procedure: OPEN REDUCTION INTERNAL FIXATION (ORIF) PROXIMAL HUMERUS FRACTURE;  Surgeon: Justice Britain, MD;  Location: [facility];  Service: Orthopedics;  Laterality: Right;    Family History  Problem Relation Age of Onset  . Prostate cancer Father   . Diabetes Father   . Colon cancer Father   . Liver cancer Father        mets from colon  . Hypertension Mother   . Other Mother        Carcinoid tumor of intestine  . Hypertension Sister   . Breast cancer Neg Hx     Social History   Socioeconomic History  . Marital status: Married    Spouse name: Not on file  . Number of  children: 2  . Years of education: Not on file  . Highest education level: Not on file  Occupational History  . Occupation: Best boy: [employer]  Tobacco Use  . Smoking status: Never Smoker  . Smokeless tobacco: Never Used  Vaping Use  . Vaping Use: Never used  Substance and Sexual Activity  . Alcohol use: Yes    Comment: very rare  . Drug use: No  . Sexual activity: Yes    Birth control/protection: Condom  Other Topics Concern  . Not on file  Social History Narrative  . Not on file   Social Determinants of Health   Financial Resource Strain:   . Difficulty of Paying Living Expenses: Not on file  Food Insecurity:   . Worried About Charity fundraiser in the Last Year: Not on file  . Ran Out of Food in the Last Year: Not on file  Transportation Needs:   . Lack of Transportation (Medical): Not on file  . Lack of Transportation (Non-Medical): Not on file  Physical Activity:   . Days of Exercise per Week: Not on file  . Minutes of Exercise per Session: Not on file  Stress:   . Feeling of Stress : Not on file  Social Connections:   . Frequency of Communication with Friends and Family: Not on file  . Frequency of Social Gatherings with Friends and Family: Not on file  . Attends Religious Services: Not on file  . Active Member of Clubs or Organizations: Not on file  . Attends Archivist Meetings: Not on file  . Marital Status: Not on file  Intimate Partner Violence:   . Fear of Current or Ex-Partner: Not on file  . Emotionally Abused: Not on file  . Physically Abused: Not on file  . Sexually Abused: Not on file      Review of systems: All other review of systems negative except as mentioned in the HPI.   Physical Exam: Vitals:   01/16/20 1320  BP: 120/78  Pulse: (!) 50  SpO2: 98%   Body mass index is 24.69 kg/m. Gen:      No acute distress HEENT:  sclera anicteric Abd:      soft, non-tender; no palpable masses, no  distension Ext:    No edema Neuro: alert and oriented x 3 Psych: normal mood and affect  Data Reviewed:  Reviewed labs, radiology imaging, old records and pertinent past GI work up   Assessment and Plan/Recommendations:  64 year old very pleasant female with history of hiatal hernia, adenomatous colon polyps, diverticulitis and family history of colon cancer with complaints of epigastric pain  She recently used NSAIDs, will need to exclude peptic ulcer disease, erosive gastritis or esophagitis Start omeprazole 40 mg daily Antireflux measures Avoid NSAIDs Schedule for EGD  for further evaluation The risks and benefits as well as alternatives of endoscopic procedure(s) have been discussed and reviewed. All questions answered. The patient agrees to proceed.  Obtain right upper quadrant abdominal ultrasound to exclude gallbladder disease or any significant liver pathology  History of adenomatous colon polyps and family history of colon cancer in father, due for surveillance colonoscopy in February 2022  This visit required 40 minutes of patient care (this includes precharting, chart review, review of results, face-to-face time used for counseling as well as treatment plan and follow-up. The patient was provided an opportunity to ask questions and all were answered. The patient agreed with the plan and demonstrated an understanding of the instructions.  Damaris Hippo , MD    CC: Berkley Harvey, NP

## 2020-01-16 NOTE — Patient Instructions (Addendum)
You have been scheduled for an abdominal ultrasound at [facility] (1st floor of hospital) on 01/20/2020 at 9:15am . Please arrive 15 minutes prior to your appointment for registration. Make certain not to have anything to eat or drink after midnight prior to your appointment. Should you need to reschedule your appointment, please contact radiology at [phone]. This test typically takes about 30 minutes to perform.  If you are age 63 or older, your body mass index should be between 23-30. Your Body mass index is 24.91 kg/m. If this is out of the aforementioned range listed, please consider follow up with your Primary Care Provider.  If you are age 4 or younger, your body mass index should be between 19-25. Your Body mass index is 24.91 kg/m. If this is out of the aformentioned range listed, please consider follow up with your Primary Care Provider.    Due to recent changes in healthcare laws, you may see the results of your imaging and laboratory studies on MyChart before your provider has had a chance to review them.  We understand that in some cases there may be results that are confusing or concerning to you. Not all laboratory results come back in the same time frame and the provider may be waiting for multiple results in order to interpret others.  Please give [place] 48 hours in order for your provider to thoroughly review all the results before contacting the office for clarification of your results.   We will send omeprazole 40 mg to your pharmacy  Your recall colonoscopy is in 04/2020   You have been scheduled for an endoscopy. Please follow written instructions given to you at your visit today. If you use inhalers (even only as needed), please bring them with you on the day of your procedure.   I appreciate the  opportunity to care for you  Thank You   Harl Bowie , MD

## 2020-01-20 ENCOUNTER — Other Ambulatory Visit: Payer: Self-pay

## 2020-01-20 ENCOUNTER — Ambulatory Visit (HOSPITAL_COMMUNITY)
Admission: RE | Admit: 2020-01-20 | Discharge: 2020-01-20 | Disposition: A | Discharge status: Home / Self Care | Payer: BC Managed Care – PPO | Source: Ambulatory Visit | Attending: Gastroenterology | Admitting: Gastroenterology

## 2020-01-20 DIAGNOSIS — R1012 Left upper quadrant pain: Secondary | ICD-10-CM | POA: Diagnosis present

## 2020-01-20 DIAGNOSIS — K219 Gastro-esophageal reflux disease without esophagitis: Secondary | ICD-10-CM | POA: Diagnosis present

## 2020-01-20 DIAGNOSIS — K449 Diaphragmatic hernia without obstruction or gangrene: Secondary | ICD-10-CM | POA: Diagnosis present

## 2020-01-20 DIAGNOSIS — R1013 Epigastric pain: Secondary | ICD-10-CM | POA: Insufficient documentation

## 2020-01-23 ENCOUNTER — Ambulatory Visit (INDEPENDENT_AMBULATORY_CARE_PROVIDER_SITE_OTHER): Payer: BC Managed Care – PPO | Admitting: Orthotics

## 2020-01-23 ENCOUNTER — Encounter: Payer: Self-pay | Admitting: Gastroenterology

## 2020-01-23 ENCOUNTER — Other Ambulatory Visit: Payer: Self-pay

## 2020-01-23 DIAGNOSIS — M2011 Hallux valgus (acquired), right foot: Secondary | ICD-10-CM | POA: Diagnosis not present

## 2020-01-23 DIAGNOSIS — M2041 Other hammer toe(s) (acquired), right foot: Secondary | ICD-10-CM

## 2020-01-23 DIAGNOSIS — M2012 Hallux valgus (acquired), left foot: Secondary | ICD-10-CM

## 2020-01-23 NOTE — Progress Notes (Signed)
Cast today for cmfo to address recovery from FF digit surgery.  Good arch support and met pads b/l

## 2020-01-26 ENCOUNTER — Encounter: Payer: Self-pay | Admitting: Gastroenterology

## 2020-01-26 ENCOUNTER — Other Ambulatory Visit: Payer: Self-pay

## 2020-01-26 ENCOUNTER — Ambulatory Visit (AMBULATORY_SURGERY_CENTER): Payer: BC Managed Care – PPO | Admitting: Gastroenterology

## 2020-01-26 VITALS — BP 132/81 | HR 67 | Temp 96.8°F | Resp 16 | Ht 62.0 in | Wt 136.0 lb

## 2020-01-26 DIAGNOSIS — R1013 Epigastric pain: Secondary | ICD-10-CM

## 2020-01-26 DIAGNOSIS — K449 Diaphragmatic hernia without obstruction or gangrene: Secondary | ICD-10-CM | POA: Diagnosis not present

## 2020-01-26 DIAGNOSIS — K226 Gastro-esophageal laceration-hemorrhage syndrome: Secondary | ICD-10-CM | POA: Diagnosis not present

## 2020-01-26 MED ORDER — SODIUM CHLORIDE 0.9 % IV SOLN: 500.0000 mL | INTRAVENOUS | Status: DC

## 2020-01-26 NOTE — Patient Instructions (Signed)
Handout provided on hiatal hernia and anti-reflux regimen.   Refer to a surgeon (Dr. Greer Pickerel, CCS) at appointment to be scheduled to discuss possible hernia repair.   Return to GI office in 6 months. Please call to schedule appointment.   YOU HAD AN ENDOSCOPIC PROCEDURE TODAY AT [facility]:   Refer to the procedure report that was given to you for any specific questions about what was found during the examination.  If the procedure report does not answer your questions, please call your gastroenterologist to clarify.  If you requested that your care partner not be given the details of your procedure findings, then the procedure report has been included in a sealed envelope for you to review at your convenience later.  YOU SHOULD EXPECT: Some feelings of bloating in the abdomen. Passage of more gas than usual.  Walking can help get rid of the air that was put into your GI tract during the procedure and reduce the bloating. If you had a lower endoscopy (such as a colonoscopy or flexible sigmoidoscopy) you may notice spotting of blood in your stool or on the toilet paper. If you underwent a bowel prep for your procedure, you may not have a normal bowel movement for a few days.  Please Note:  You might notice some irritation and congestion in your nose or some drainage.  This is from the oxygen used during your procedure.  There is no need for concern and it should clear up in a day or so.  SYMPTOMS TO REPORT IMMEDIATELY:   Following upper endoscopy (EGD)  Vomiting of blood or coffee ground material  New chest pain or pain under the shoulder blades  Painful or persistently difficult swallowing  New shortness of breath  Fever of 100F or higher  Black, tarry-looking stools  For urgent or emergent issues, a gastroenterologist can be reached at any hour by calling [phone]. Do not use MyChart messaging for urgent concerns.    DIET:  We do recommend a small meal at  first, but then you may proceed to your regular diet.  Drink plenty of fluids but you should avoid alcoholic beverages for 24 hours.  ACTIVITY:  You should plan to take it easy for the rest of today and you should NOT DRIVE or use heavy machinery until tomorrow (because of the sedation medicines used during the test).    FOLLOW UP: Our staff will call the number listed on your records 48-72 hours following your procedure to check on you and address any questions or concerns that you may have regarding the information given to you following your procedure. If we do not reach you, we will leave a message.  We will attempt to reach you two times.  During this call, we will ask if you have developed any symptoms of COVID 19. If you develop any symptoms (ie: fever, flu-like symptoms, shortness of breath, cough etc.) before then, please call [phone].  If you test positive for Covid 19 in the 2 weeks post procedure, please call and report this information to [place].    If any biopsies were taken you will be contacted by phone or by letter within the next 1-3 weeks.  Please call us at [phone] if you have not heard about the biopsies in 3 weeks.    SIGNATURES/CONFIDENTIALITY: You and/or your care partner have signed paperwork which will be entered into your electronic medical record.  These signatures attest to the fact that that  the information above on your After Visit Summary has been reviewed and is understood.  Full responsibility of the confidentiality of this discharge information lies with you and/or your care-partner.

## 2020-01-26 NOTE — Progress Notes (Signed)
V/S CW I have reviewed the patient's medical history in detail and updated the computerized patient record.  

## 2020-01-26 NOTE — Progress Notes (Signed)
pt tolerated well. VSS. awake and to recovery. Report given to RN. Bite block inserted and removed without trauma. 

## 2020-01-26 NOTE — Op Note (Signed)
[facility] Patient Name: Gabrielle Gonzalez Procedure Date: 01/26/2020 10:43 AM MRN: [id number] Endoscopist: Mauri Pole , MD Age: 63 Referring MD:  Date of Birth: November 21, 1955 Gender: Female Account #: [id number] Procedure:                Upper GI endoscopy Indications:              Epigastric abdominal pain Medicines:                Monitored Anesthesia Care Procedure:                Pre-Anesthesia Assessment:                           - Prior to the procedure, a History and Physical                            was performed, and patient medications and                            allergies were reviewed. The patient's tolerance of                            previous anesthesia was also reviewed. The risks                            and benefits of the procedure and the sedation                            options and risks were discussed with the patient.                            All questions were answered, and informed consent                            was obtained. Prior Anticoagulants: The patient has                            taken no previous anticoagulant or antiplatelet                            agents. ASA Grade Assessment: II - A patient with                            mild systemic disease. After reviewing the risks                            and benefits, the patient was deemed in                            satisfactory condition to undergo the procedure.                           After obtaining informed consent, the endoscope was  passed under direct vision. Throughout the                            procedure, the patient's blood pressure, pulse, and                            oxygen saturations were monitored continuously. The                            Endoscope was introduced through the mouth, and                            advanced to the second part of duodenum. The upper                            GI endoscopy was  accomplished without difficulty.                            The patient tolerated the procedure well. Scope In: Scope Out: Findings:                 The Z-line was regular and was found 35 cm from the                            incisors.                           The gastroesophageal flap valve was visualized                            endoscopically and classified as Hill Grade IV (no                            fold, wide open lumen, hiatal hernia present).                           A 6 cm sliding hiatal hernia was present.                           A less than 5 mm bleeding Mallory-Weiss tear was                            found.                           A few localized Cameron's erosions with no bleeding                            and no stigmata of recent bleeding were found in                            the cardia.                           The examined duodenum was normal. Complications:  No immediate complications. Estimated Blood Loss:     Estimated blood loss was minimal. Impression:               - Z-line regular, 35 cm from the incisors.                           - Gastroesophageal flap valve classified as Hill                            Grade IV (no fold, wide open lumen, hiatal hernia                            present).                           - 6 cm hiatal hernia.                           - Mallory-Weiss tear.                           - Gastric erosions with no bleeding and no stigmata                            of recent bleeding.                           - Normal examined duodenum.                           - No specimens collected. Recommendation:           - Resume previous diet.                           - Continue present medications.                           - Follow an antireflux regimen.                           - Refer to a surgeon (Dr.Eric Redmond Pulling, CCS) at                            appointment to be scheduled to discuss possible                             hernia repair.                           - Return to GI office in 6 months. Please call to                            schedule appointment. Mauri Pole, MD 01/26/2020 11:10:06 AM This report has been signed electronically.

## 2020-01-30 ENCOUNTER — Telehealth: Payer: Self-pay

## 2020-01-30 NOTE — Telephone Encounter (Signed)
  Follow up Call-  Call back number 01/26/2020  Post procedure Call Back phone  # [phone]  Permission to leave phone message Yes  Some recent data might be hidden     Patient questions:  Do you have a fever, pain , or abdominal swelling? No. Pain Score  0 *  Have you tolerated food without any problems? Yes.    Have you been able to return to your normal activities? Yes.    Do you have any questions about your discharge instructions: Diet   No. Medications  No. Follow up visit  No.  Do you have questions or concerns about your Care? No.  Actions: * If pain score is 4 or above: No action needed, pain <4.  1. Have you developed a fever since your procedure? no  2.   Have you had an respiratory symptoms (SOB or cough) since your procedure? no  3.   Have you tested positive for COVID 19 since your procedure no  4.   Have you had any family members/close contacts diagnosed with the COVID 19 since your procedure?  no   If yes to any of these questions please route to Joylene John, RN and Joella Prince, RN

## 2020-01-31 ENCOUNTER — Telehealth: Payer: Self-pay

## 2020-01-31 ENCOUNTER — Telehealth: Payer: Self-pay | Admitting: Gastroenterology

## 2020-01-31 NOTE — Telephone Encounter (Signed)
She had the CT scan at [facility], she will call them and have them fax results to me

## 2020-01-31 NOTE — Telephone Encounter (Signed)
Ok, Is she getting the CT at [facility] or somewhere else?  Please advise patient to forward the results to [place] to review if it is being done somewhere else.  Thank you

## 2020-01-31 NOTE — Telephone Encounter (Signed)
FYI Dr Nandigam 

## 2020-01-31 NOTE — Telephone Encounter (Signed)
Medical records, procedure report, demographics and insurance information faxed to [facility] for an appointment with Dr Greer Pickerel.

## 2020-02-21 ENCOUNTER — Other Ambulatory Visit: Payer: Self-pay

## 2020-02-21 ENCOUNTER — Ambulatory Visit: Payer: BC Managed Care – PPO | Admitting: Orthotics

## 2020-02-21 DIAGNOSIS — M2022 Hallux rigidus, left foot: Secondary | ICD-10-CM

## 2020-02-21 DIAGNOSIS — M2012 Hallux valgus (acquired), left foot: Secondary | ICD-10-CM

## 2020-02-21 DIAGNOSIS — M2041 Other hammer toe(s) (acquired), right foot: Secondary | ICD-10-CM

## 2020-02-21 NOTE — Progress Notes (Signed)
Patient came in today to pick up custom made foot orthotics.  The goals were accomplished and the patient reported no dissatisfaction with said orthotics.  Patient was advised of breakin period and how to report any issues. 

## 2020-03-08 ENCOUNTER — Other Ambulatory Visit: Payer: Self-pay | Admitting: Nurse Practitioner

## 2020-03-08 DIAGNOSIS — M810 Age-related osteoporosis without current pathological fracture: Secondary | ICD-10-CM

## 2020-03-08 DIAGNOSIS — Z1231 Encounter for screening mammogram for malignant neoplasm of breast: Secondary | ICD-10-CM

## 2020-04-06 ENCOUNTER — Ambulatory Visit (AMBULATORY_SURGERY_CENTER): Payer: Self-pay

## 2020-04-06 ENCOUNTER — Other Ambulatory Visit: Payer: Self-pay

## 2020-04-06 VITALS — Ht 62.0 in | Wt 139.0 lb

## 2020-04-06 DIAGNOSIS — Z86010 Personal history of colonic polyps: Secondary | ICD-10-CM

## 2020-04-06 DIAGNOSIS — Z800 Family history of malignant neoplasm of digestive organs: Secondary | ICD-10-CM

## 2020-04-06 NOTE — Progress Notes (Signed)
No allergies to soy or egg Pt is not on blood thinners or diet pills Denies issues with sedation/intubation Denies atrial flutter/fib Denies constipation   Emmi instructions given to pt  Pt is aware of Covid safety and care partner requirements.  

## 2020-04-18 ENCOUNTER — Encounter: Payer: Self-pay | Admitting: Gastroenterology

## 2020-04-20 ENCOUNTER — Encounter: Payer: Self-pay | Admitting: Gastroenterology

## 2020-04-20 ENCOUNTER — Ambulatory Visit (AMBULATORY_SURGERY_CENTER): Payer: BC Managed Care – PPO | Admitting: Gastroenterology

## 2020-04-20 ENCOUNTER — Other Ambulatory Visit: Payer: Self-pay

## 2020-04-20 VITALS — BP 80/56 | HR 51 | Temp 97.3°F | Resp 12 | Ht 62.0 in | Wt 139.0 lb

## 2020-04-20 DIAGNOSIS — D124 Benign neoplasm of descending colon: Secondary | ICD-10-CM

## 2020-04-20 DIAGNOSIS — Z800 Family history of malignant neoplasm of digestive organs: Secondary | ICD-10-CM

## 2020-04-20 DIAGNOSIS — Z86010 Personal history of colonic polyps: Secondary | ICD-10-CM | POA: Diagnosis present

## 2020-04-20 DIAGNOSIS — K635 Polyp of colon: Secondary | ICD-10-CM

## 2020-04-20 DIAGNOSIS — D122 Benign neoplasm of ascending colon: Secondary | ICD-10-CM

## 2020-04-20 MED ORDER — SODIUM CHLORIDE 0.9 % IV SOLN: 500.0000 mL | Freq: Once | INTRAVENOUS | Status: DC

## 2020-04-20 NOTE — Patient Instructions (Signed)
HANDOUTS PROVIDED ON: POLYPS, DIVERTICULOSIS, & HEMORRHOIDS  The polyps removed today have been sent for pathology.  The results can take 1-3 weeks to receive.  When your next colonoscopy should occur will be based on the pathology results.    You may resume your previous diet and medication schedule.  Thank you for allowing [place] to care for you today!!!   YOU HAD AN ENDOSCOPIC PROCEDURE TODAY AT [facility]:   Refer to the procedure report that was given to you for any specific questions about what was found during the examination.  If the procedure report does not answer your questions, please call your gastroenterologist to clarify.  If you requested that your care partner not be given the details of your procedure findings, then the procedure report has been included in a sealed envelope for you to review at your convenience later.  YOU SHOULD EXPECT: Some feelings of bloating in the abdomen. Passage of more gas than usual.  Walking can help get rid of the air that was put into your GI tract during the procedure and reduce the bloating. If you had a lower endoscopy (such as a colonoscopy or flexible sigmoidoscopy) you may notice spotting of blood in your stool or on the toilet paper. If you underwent a bowel prep for your procedure, you may not have a normal bowel movement for a few days.  Please Note:  You might notice some irritation and congestion in your nose or some drainage.  This is from the oxygen used during your procedure.  There is no need for concern and it should clear up in a day or so.  SYMPTOMS TO REPORT IMMEDIATELY:   Following lower endoscopy (colonoscopy or flexible sigmoidoscopy):  Excessive amounts of blood in the stool  Significant tenderness or worsening of abdominal pains  Swelling of the abdomen that is new, acute  Fever of 100F or higher  For urgent or emergent issues, a gastroenterologist can be reached at any hour by calling [phone]. Do  not use MyChart messaging for urgent concerns.    DIET:  We do recommend a small meal at first, but then you may proceed to your regular diet.  Drink plenty of fluids but you should avoid alcoholic beverages for 24 hours.  ACTIVITY:  You should plan to take it easy for the rest of today and you should NOT DRIVE or use heavy machinery until tomorrow (because of the sedation medicines used during the test).    FOLLOW UP: Our staff will call the number listed on your records Tuesday morning between 7:15 am and 8:15 am to check on you and address any questions or concerns that you may have regarding the information given to you following your procedure. If we do not reach you, we will leave a message.  We will attempt to reach you two times.  During this call, we will ask if you have developed any symptoms of COVID 19. If you develop any symptoms (ie: fever, flu-like symptoms, shortness of breath, cough etc.) before then, please call [phone].  If you test positive for Covid 19 in the 2 weeks post procedure, please call and report this information to [place].    If any biopsies were taken you will be contacted by phone or by letter within the next 1-3 weeks.  Please call us at [phone] if you have not heard about the biopsies in 3 weeks.    SIGNATURES/CONFIDENTIALITY: You and/or your care partner have signed  paperwork which will be entered into your electronic medical record.  These signatures attest to the fact that that the information above on your After Visit Summary has been reviewed and is understood.  Full responsibility of the confidentiality of this discharge information lies with you and/or your care-partner.

## 2020-04-20 NOTE — Progress Notes (Signed)
Pt. Reports no change in her medical or surgical history since her pre-visit 04/06/2020.

## 2020-04-20 NOTE — Progress Notes (Signed)
A/ox3, pleased with MAC, report to RN 

## 2020-04-20 NOTE — Op Note (Signed)
[facility] Patient Name: Brandelyn Henne Procedure Date: 04/20/2020 9:39 AM MRN: [id number] Endoscopist: Mauri Pole , MD Age: 65 Referring MD:  Date of Birth: January 25, 1956 Gender: Female Account #: [id number] Procedure:                Colonoscopy Indications:              High risk colon cancer surveillance: Personal                            history of colonic polyps, High risk colon cancer                            surveillance: Personal history of multiple (3 or                            more) adenomas Medicines:                Monitored Anesthesia Care Procedure:                Pre-Anesthesia Assessment:                           - Prior to the procedure, a History and Physical                            was performed, and patient medications and                            allergies were reviewed. The patient's tolerance of                            previous anesthesia was also reviewed. The risks                            and benefits of the procedure and the sedation                            options and risks were discussed with the patient.                            All questions were answered, and informed consent                            was obtained. Prior Anticoagulants: The patient has                            taken no previous anticoagulant or antiplatelet                            agents. ASA Grade Assessment: II - A patient with                            mild systemic disease. After reviewing the risks  and benefits, the patient was deemed in                            satisfactory condition to undergo the procedure.                           After obtaining informed consent, the colonoscope                            was passed under direct vision. Throughout the                            procedure, the patient's blood pressure, pulse, and                            oxygen saturations were monitored continuously.  The                            Olympus PCF-H190DL ([id number]) Colonoscope was                            introduced through the anus and advanced to the the                            cecum, identified by appendiceal orifice and                            ileocecal valve. The colonoscopy was performed                            without difficulty. The patient tolerated the                            procedure well. The quality of the bowel                            preparation was excellent. The ileocecal valve,                            appendiceal orifice, and rectum were photographed. Scope In: 10:18:20 AM Scope Out: 10:40:18 AM Scope Withdrawal Time: 0 hours 14 minutes 51 seconds  Total Procedure Duration: 0 hours 21 minutes 58 seconds  Findings:                 The perianal and digital rectal examinations were                            normal.                           A less than 1 mm polyp was found in the proximal                            descending colon. The polyp was sessile. The polyp  was removed with a cold biopsy forceps. Resection                            and retrieval were complete.                           A 18 mm polyp was found in the ascending colon. The                            polyp was flat. The polyp was removed with a cold                            snare. The polyp was removed with a piecemeal                            technique using a cold snare. Resection and                            retrieval were complete.                           Multiple small and large-mouthed diverticula were                            found in the sigmoid colon and descending colon.                           Non-bleeding external and internal hemorrhoids were                            found during retroflexion. The hemorrhoids were                            small. Complications:            No immediate complications. Estimated Blood Loss:      Estimated blood loss was minimal. Impression:               - One less than 1 mm polyp in the proximal                            descending colon, removed with a cold biopsy                            forceps. Resected and retrieved.                           - One 18 mm polyp in the ascending colon, removed                            with a cold snare and removed piecemeal using a                            cold snare. Resected and retrieved.                           -  Diverticulosis in the sigmoid colon and in the                            descending colon.                           - Non-bleeding external and internal hemorrhoids. Recommendation:           - Patient has a contact number available for                            emergencies. The signs and symptoms of potential                            delayed complications were discussed with the                            patient. Return to normal activities tomorrow.                            Written discharge instructions were provided to the                            patient.                           - Resume previous diet.                           - Continue present medications.                           - Await pathology results.                           - Repeat colonoscopy in 3 years for surveillance                            based on pathology results. Mauri Pole, MD 04/20/2020 10:50:24 AM This report has been signed electronically.

## 2020-04-24 ENCOUNTER — Telehealth: Payer: Self-pay | Admitting: *Deleted

## 2020-04-24 NOTE — Telephone Encounter (Signed)
  Follow up Call-  Call back number 04/20/2020 01/26/2020  Post procedure Call Back phone  # [phone]  Permission to leave phone message Yes Yes  Some recent data might be hidden     Patient questions:  Message left to call us if necessary.

## 2020-04-24 NOTE — Telephone Encounter (Signed)
1. Have you developed a fever since your procedure? no  2.   Have you had an respiratory symptoms (SOB or cough) since your procedure? no  3.   Have you tested positive for COVID 19 since your procedure no  4.   Have you had any family members/close contacts diagnosed with the COVID 19 since your procedure?  no   If yes to any of these questions please route to Joylene John, RN and Joella Prince, RN Follow up Call-  Call back number 04/20/2020 01/26/2020  Post procedure Call Back phone  # [phone]  Permission to leave phone message Yes Yes  Some recent data might be hidden     Patient questions:  Do you have a fever, pain , or abdominal swelling? No. Pain Score  0 *  Have you tolerated food without any problems? Yes.    Have you been able to return to your normal activities? Yes.    Do you have any questions about your discharge instructions: Diet   No. Medications  No. Follow up visit  No.  Do you have questions or concerns about your Care? No.  Actions: * If pain score is 4 or above: No action needed, pain <4.

## 2020-05-05 ENCOUNTER — Encounter: Payer: Self-pay | Admitting: Gastroenterology

## 2020-06-26 ENCOUNTER — Ambulatory Visit
Admission: RE | Admit: 2020-06-26 | Discharge: 2020-06-26 | Disposition: A | Discharge status: Home / Self Care | Payer: BC Managed Care – PPO | Source: Ambulatory Visit | Attending: Nurse Practitioner | Admitting: Nurse Practitioner

## 2020-06-26 ENCOUNTER — Other Ambulatory Visit: Payer: Self-pay

## 2020-06-26 DIAGNOSIS — Z1231 Encounter for screening mammogram for malignant neoplasm of breast: Secondary | ICD-10-CM

## 2020-06-26 DIAGNOSIS — M810 Age-related osteoporosis without current pathological fracture: Secondary | ICD-10-CM

## 2021-04-08 ENCOUNTER — Ambulatory Visit (HOSPITAL_BASED_OUTPATIENT_CLINIC_OR_DEPARTMENT_OTHER): Payer: BC Managed Care – PPO | Admitting: Cardiology

## 2021-04-08 ENCOUNTER — Encounter (HOSPITAL_BASED_OUTPATIENT_CLINIC_OR_DEPARTMENT_OTHER): Payer: Self-pay | Admitting: Cardiology

## 2021-04-08 ENCOUNTER — Other Ambulatory Visit: Payer: Self-pay

## 2021-04-08 VITALS — BP 138/78 | HR 71 | Ht 61.05 in | Wt 136.6 lb

## 2021-04-08 DIAGNOSIS — I10 Essential (primary) hypertension: Secondary | ICD-10-CM

## 2021-04-08 DIAGNOSIS — Q248 Other specified congenital malformations of heart: Secondary | ICD-10-CM | POA: Diagnosis not present

## 2021-04-08 DIAGNOSIS — I517 Cardiomegaly: Secondary | ICD-10-CM

## 2021-04-08 DIAGNOSIS — E782 Mixed hyperlipidemia: Secondary | ICD-10-CM | POA: Diagnosis not present

## 2021-04-08 DIAGNOSIS — I421 Obstructive hypertrophic cardiomyopathy: Secondary | ICD-10-CM

## 2021-04-08 DIAGNOSIS — I3489 Other nonrheumatic mitral valve disorders: Secondary | ICD-10-CM

## 2021-04-08 DIAGNOSIS — Z7189 Other specified counseling: Secondary | ICD-10-CM

## 2021-04-08 MED ORDER — METOPROLOL SUCCINATE ER 25 MG PO TB24: 25.0000 mg | ORAL_TABLET | Freq: Every day | ORAL | 3 refills | Status: DC

## 2021-04-08 NOTE — Progress Notes (Signed)
Cardiology Office Note:    Date:  04/09/2021   ID:  Gabrielle Gonzalez, DOB [birth year], MRN [id number]  PCP:  Berkley Harvey, NP  Cardiologist:  Buford Dresser, MD  Referring MD: Berkley Harvey, NP   CC: new patient consultation for murmur, abnormal echo with mitral regurgitation  History of Present Illness:    Gabrielle Gonzalez is a 66 y.o. female with a hx of hyperlipidemia, GERD, arthritis, and depression, who is seen as a new consult at the request of Berkley Harvey, NP for the evaluation and management of newly recognized heart murmur and abnormal echo showing moderate mitral regurgitation.  Notes from Eldridge Abrahams, NP reviewed 03/28/21, annual physical. Echo report reviewed in Care Everywhere, unable to see actual images.  Cardiovascular risk factors: Prior clinical ASCVD: None Comorbid conditions: Typically, her at home blood pressure was always well controlled around 110/70. Only recently has her blood pressure been averaging in the 130s-140s/90s. Family history: Her mother had hypertension. Her older sister has hypertension, and a history of slight heart murmur. Her younger sister was told she had a slight mitral valve prolapse. Prior cardiac testing and/or incidental findings on other testing (ie coronary calcium): TTE 03/13/21 from [place] reviewed at length. While she was in Urgent Care 02/2021 she was first told that she had a heart murmur. Exercise level: Not much since her COVID infection. She has been trying to increase her exercise with cardio classes twice a week. Of note, she endorses exertional shortness of breath, as well as nausea and emesis if she pushes herself too much. She denies any syncopal episodes. Typically she scales back if she begins to feel ill. Occasionally she will perform 8 lb arm curls and 3 lb overhead weight lifting. Current diet: Will work on staying hydrated.  She is accompanied by her husband. Overall, she appears well.  She reports that she  presented to urgent care for strep throat, told she had a murmur. She saw her PCP 02/14/21, noted to have 2/6 systolic murmur. Echo ordered, see full report. 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 Gradient measured at 212 but thought not to be accurate. Moderate MR due to [facility].  Recently she was started on fenofibrate for high triglycerides.  She denies any palpitations, or chest pain. No lightheadedness, headaches, orthopnea, PND, or lower extremity edema.  In 06/2021 she plans on taking a trip to [place].   Past Medical History:  Diagnosis Date   Anxiety state, unspecified    Arthritis    Depressive disorder, not elsewhere classified    Diverticulosis    GERD (gastroesophageal reflux disease)    Headache, chronic daily    Hiatal hernia    Hyperlipidemia    Hyperplastic colon polyp    Osteoporosis    Panic disorder without agoraphobia    Rosacea    Urinary incontinence    Stress incontinence    Past Surgical History:  Procedure Laterality Date   BREAST BIOPSY Left 2009   CESAREAN SECTION     COLONOSCOPY  2019   FOOT SURGERY     ORIF HUMERUS FRACTURE Right 09/04/2016   Procedure: OPEN REDUCTION INTERNAL FIXATION (ORIF) PROXIMAL HUMERUS FRACTURE;  Surgeon: Justice Britain, MD;  Location: [facility];  Service: Orthopedics;  Laterality: Right;    Current Medications: Current Outpatient Medications on File Prior to Visit  Medication Sig   B Complex Vitamins (VITAMIN B COMPLEX PO) Take 1 capsule  by mouth daily.   Cholecalciferol (VITAMIN D3) 2000 units capsule Take 2,000 Units by mouth daily.   diclofenac Sodium (VOLTAREN) 1 % GEL    estradiol (ESTRACE) 0.1 MG/GM vaginal cream    fenofibrate micronized (LOFIBRA) 200 MG capsule    fluconazole (DIFLUCAN) 200 MG tablet Take 200 mg by mouth once a week.   Magnesium 250 MG TABS Take 250 mg by mouth daily.    meloxicam (MOBIC) 7.5 MG tablet Take by mouth.    multivitamin-lutein (OCUVITE-LUTEIN) CAPS Take 1 capsule by mouth daily.   omeprazole (PRILOSEC) 40 MG capsule Take 1 capsule (40 mg total) by mouth daily.   sucralfate (CARAFATE) 1 GM/10ML suspension Take 1 g by mouth 4 (four) times daily.   tretinoin (RETIN-A) 0.1 % cream    venlafaxine (EFFEXOR) 75 MG tablet Take 75 mg by mouth daily.   terbinafine (LAMISIL) 250 MG tablet Take 250 mg by mouth daily. (Patient not taking: No sig reported)   No current facility-administered medications on file prior to visit.     Allergies:   Penicillins, Erythromycin, and Lodine [etodolac]   Social History   Tobacco Use   Smoking status: Never   Smokeless tobacco: Never  Vaping Use   Vaping Use: Never used  Substance Use Topics   Alcohol use: Yes    Comment: very rare   Drug use: No    Family History: family history includes Colon cancer in her father and mother; Diabetes in her father; Hypertension in her mother and sister; Liver cancer in her father; Other in her mother; Prostate cancer in her father. There is no history of Breast cancer, Colon polyps, Esophageal cancer, Rectal cancer, or Stomach cancer.  ROS:   Please see the history of present illness.  Additional pertinent ROS: Constitutional: Negative for chills, fever, night sweats, unintentional weight loss  HENT: Negative for ear pain and hearing loss.   Eyes: Negative for loss of vision and eye pain.  Respiratory: Negative for cough, sputum, wheezing. Positive for shortness of breath. Cardiovascular: See HPI. Gastrointestinal: Negative for abdominal pain, melena, and hematochezia.  Genitourinary: Negative for dysuria and hematuria.  Musculoskeletal: Negative for falls and myalgias.  Skin: Negative for itching and rash.  Neurological: Negative for focal weakness, focal sensory changes and loss of consciousness.  Endo/Heme/Allergies: Does not bruise/bleed easily.     EKGs/Labs/Other Studies Reviewed:    The following studies were  reviewed today:  TTE 03/13/2021 ([place]): SUMMARY  Findings are consistent with hypertensive heart disease.   Recommend cardiology assessment.   There is mild concentric left ventricular hypertrophy with normal wall  motion, normal systolic function and ejection fraction '70-75%'.  Theere is basal septal hypertrophy and severe LVOT obstruction due to  systolic anterior motion of the mitral valve [SAM]. Peak LVOT gradient  at rest is severely elevated at 64 mmHg. Peak LVOT gradient with  valsalva is higher but not accurately defined- measured value of 212  mmHg likely reflects MR contamination of LVOT signal.  Mild diastolic dysfunction [Grade IB] with likely increased left  atrial pressure. E/e' '16 and 23'. E/e' < 8 is normal, > 15 is  suggestive of increased PCWP.  The right ventricle is normal in size and function.  The atria are normal in size.  There is at least moderate mitral regurgitation due to [facility].  IVC size was normal.  There was insufficient TR detected to calculate RV systolic pressure.   There is no comparison study available.  EKG:  EKG is personally reviewed.   04/08/2021: NSR at 71 bpm, nonspecific T wave pattern  Recent Labs: No results found for requested labs within last 8760 hours.   Recent Lipid Panel No results found for: CHOL, TRIG, HDL, CHOLHDL, VLDL, LDLCALC, LDLDIRECT  Physical Exam:    VS:  BP 138/78    Pulse 71    Ht 5' 1.05" (1.551 m)    Wt 136 lb 9.6 oz (62 kg)    LMP 06/18/2011    SpO2 99%    BMI 25.77 kg/m     Wt Readings from Last 3 Encounters:  04/08/21 136 lb 9.6 oz (62 kg)  04/20/20 139 lb (63 kg)  04/06/20 139 lb (63 kg)    GEN: Well nourished, well developed in no acute distress HEENT: Normal, moist mucous membranes NECK: No JVD CARDIAC: regular rhythm, normal S1 and S2, no rubs or gallops. 2/6 LVOT murmur. No significant mitral murmur appreciated VASCULAR: Radial and DP pulses 2+ bilaterally. No carotid  bruits RESPIRATORY:  Clear to auscultation without rales, wheezing or rhonchi  ABDOMEN: Soft, non-tender, non-distended MUSCULOSKELETAL:  Ambulates independently SKIN: Warm and dry, no edema NEUROLOGIC:  Alert and oriented x 3. No focal neuro deficits noted. PSYCHIATRIC:  Normal affect    ASSESSMENT:    1. LVH (left ventricular hypertrophy)   2. Left ventricular outflow tract obstruction   3. Mixed hyperlipidemia   4. Essential hypertension   5. Cardiac risk counseling   6. Counseling on health promotion and disease prevention   7. Systolic anterior movement of mitral valve    PLAN:    Focal basal septal hypertrophy Documented LVOT obstruction with SAM and mitral regurgitation -initially noted when presenting with strep throat -noted that blood pressure elevated at the time as well -I cannot see images, but reported as focal basal septal hypertrophy with severe obstruction and SAM/MR -no syncope or limiting exertional symptoms -we discussed the etiology of this at length, including using models/images -murmur is soft now, suspect louder initially due to elevated BP/high flow from illness -will start low dose beta blocker for negative inotropy -reviewed red flag warning signs that need immediate medical attention -needs good BP control -avoid dehydration  Hypertension: would keep tight control to prevent progression of LVH -starting metoprolol today, as above -monitor BP  Mixed hyperlipidemia -recently started on fenofibrate -lipids from 03/27/21: Tchol 377, TG 487, HDL 35, LDL could not be reported. Prior LDL 02/15/20 185 -I would strongly recommend statin. Continue to discuss at follow up. Consider calcium score.  Cardiac risk counseling and prevention recommendations: -recommend heart healthy/Mediterranean diet, with whole grains, fruits, vegetable, fish, lean meats, nuts, and olive oil. Limit salt. -recommend moderate walking, 3-5 times/week for 30-50 minutes each  session. Aim for at least 150 minutes.week. Goal should be pace of 3 miles/hours, or walking 1.5 miles in 30 minutes -recommend avoidance of tobacco products. Avoid excess alcohol. -ASCVD risk score: The ASCVD Risk score (Arnett DK, et al., 2019) failed to calculate for the following reasons:   The valid total cholesterol range is 130 to 320 mg/dL    Plan for follow up: 6 months or sooner as needed.  Buford Dresser, MD, PhD, [facility]    Medication Adjustments/Labs and Tests Ordered: Current medicines are reviewed at length with the patient today.  Concerns regarding medicines are outlined above.   Orders Placed This Encounter  Procedures   EKG 12-Lead   Meds ordered this encounter  Medications   metoprolol succinate (TOPROL-XL) 25 MG 24 hr tablet    Sig: Take 1 tablet (25 mg total) by mouth daily. Take with or immediately following a meal.    Dispense:  90 tablet    Refill:  3   Patient Instructions  Medication Instructions:  1) START: Metoprolol Succinate 25 mg daily  *If you need a refill on your cardiac medications before your next appointment, please call your pharmacy*   Lab Work: None ordered today   Testing/Procedures: None ordered today   Follow-Up: At [facility], you and your health needs are our priority.  As part of our continuing mission to provide you with exceptional heart care, we have created designated Provider Care Teams.  These Care Teams include your primary Cardiologist (physician) and Advanced Practice Providers (APPs -  Physician Assistants and Nurse Practitioners) who all work together to provide you with the care you need, when you need it.  We recommend signing up for the patient portal called "MyChart".  Sign up information is provided on this After Visit Summary.  MyChart is used to connect with patients for Virtual Visits (Telemedicine).  Patients are able to view lab/test results, encounter notes, upcoming  appointments, etc.  Non-urgent messages can be sent to your provider as well.   To learn more about what you can do with MyChart, go to [email or web address].    Your next appointment:   6 month(s)  The format for your next appointment:   In Person  Provider:   Buford Dresser, MD   You have an LVOT murmur from LVOT obstruction. It is quiet now but can increase with certain situations.   I,Mathew Stumpf,acting as a Education administrator for [employer], MD.,have documented all relevant documentation on the behalf of Buford Dresser, MD,as directed by  Buford Dresser, MD while in the presence of Buford Dresser, MD.  I, Buford Dresser, MD, have reviewed all documentation for this visit. The documentation on 04/09/21 for the exam, diagnosis, procedures, and orders are all accurate and complete.   Signed, Buford Dresser, MD PhD 04/09/2021 11:26 AM    [place]

## 2021-04-08 NOTE — Patient Instructions (Signed)
Medication Instructions:  1) START: Metoprolol Succinate 25 mg daily  *If you need a refill on your cardiac medications before your next appointment, please call your pharmacy*   Lab Work: None ordered today   Testing/Procedures: None ordered today   Follow-Up: At [facility], you and your health needs are our priority.  As part of our continuing mission to provide you with exceptional heart care, we have created designated Provider Care Teams.  These Care Teams include your primary Cardiologist (physician) and Advanced Practice Providers (APPs -  Physician Assistants and Nurse Practitioners) who all work together to provide you with the care you need, when you need it.  We recommend signing up for the patient portal called "MyChart".  Sign up information is provided on this After Visit Summary.  MyChart is used to connect with patients for Virtual Visits (Telemedicine).  Patients are able to view lab/test results, encounter notes, upcoming appointments, etc.  Non-urgent messages can be sent to your provider as well.   To learn more about what you can do with MyChart, go to [email or web address].    Your next appointment:   6 month(s)  The format for your next appointment:   In Person  Provider:   Buford Dresser, MD   You have an LVOT murmur from LVOT obstruction. It is quiet now but can increase with certain situations.

## 2021-04-09 DIAGNOSIS — I10 Essential (primary) hypertension: Secondary | ICD-10-CM

## 2021-04-09 DIAGNOSIS — I3489 Other nonrheumatic mitral valve disorders: Secondary | ICD-10-CM | POA: Insufficient documentation

## 2021-04-09 DIAGNOSIS — I517 Cardiomegaly: Secondary | ICD-10-CM

## 2021-04-09 DIAGNOSIS — Q248 Other specified congenital malformations of heart: Secondary | ICD-10-CM | POA: Insufficient documentation

## 2021-04-09 HISTORY — DX: Other nonrheumatic mitral valve disorders: I34.89

## 2021-04-09 HISTORY — DX: Cardiomegaly: I51.7

## 2021-04-09 HISTORY — DX: Essential (primary) hypertension: I10

## 2021-04-09 HISTORY — DX: Other specified congenital malformations of heart: Q24.8

## 2021-04-30 ENCOUNTER — Other Ambulatory Visit: Payer: Self-pay | Admitting: Nurse Practitioner

## 2021-04-30 DIAGNOSIS — Z1231 Encounter for screening mammogram for malignant neoplasm of breast: Secondary | ICD-10-CM

## 2021-06-08 ENCOUNTER — Telehealth: Payer: Self-pay | Admitting: Gastroenterology

## 2021-06-08 MED ORDER — CIPROFLOXACIN HCL 500 MG PO TABS: 500.0000 mg | ORAL_TABLET | Freq: Two times a day (BID) | ORAL | 0 refills | Status: DC

## 2021-06-08 MED ORDER — FLUCONAZOLE 200 MG PO TABS: 200.0000 mg | ORAL_TABLET | Freq: Every day | ORAL | 0 refills | Status: DC

## 2021-06-08 MED ORDER — METRONIDAZOLE 500 MG PO TABS: 500.0000 mg | ORAL_TABLET | Freq: Two times a day (BID) | ORAL | 0 refills | Status: DC

## 2021-06-08 NOTE — Telephone Encounter (Signed)
Patient called in with symptoms reminiscent of her diverticulitis. ?She cannot tolerate Flagyl. ?She has a penicillin allergy. ?Discomfort is in the lower quadrants mostly in the left lower quadrant. ?She has an upcoming clinic appointment next week. ?She feels she is still able to stay hydrated but is a little bit behind where she should be. ?I will send antibiotics for her Cipro 500 twice daily and Flagyl 500 twice daily.  Its not clear that she will be able to tolerate the Flagyl due to nausea and upset stomach but at least we have sent those. ?I will send her fluconazole for 3-day course in an effort of trying to decrease risk of thrush formation as she has received this in the past. ?If she has progressive worsening pain she will go into the emergency room for further evaluation. ?She will keep her follow-up next week with NP Kennedy-Smith. ?I will have Dr. Woodward Ku team reach out to her on Monday to see how she is doing if she has not called back in with progressive issues. ? ? ?Justice Britain, MD ?[facility] ?Advanced Endoscopy ?Office # [phone] ?

## 2021-06-10 NOTE — Telephone Encounter (Signed)
Ok, thank you

## 2021-06-10 NOTE — Telephone Encounter (Signed)
Spoke with the patient. States she is "better today than I was." She is moving her bowels and reports "that is painful." Bowel movements are "a little loose." ?Afebrile. No eating much. Discussed soft low residue foods that would be okay for her. Appointment is tomorrow. She will call and ask for me if she has any issues today. ?

## 2021-06-11 ENCOUNTER — Other Ambulatory Visit (INDEPENDENT_AMBULATORY_CARE_PROVIDER_SITE_OTHER): Payer: BC Managed Care – PPO

## 2021-06-11 ENCOUNTER — Ambulatory Visit: Payer: BC Managed Care – PPO | Admitting: Nurse Practitioner

## 2021-06-11 ENCOUNTER — Encounter: Payer: Self-pay | Admitting: Nurse Practitioner

## 2021-06-11 VITALS — BP 110/60 | HR 72 | Ht 61.05 in | Wt 131.0 lb

## 2021-06-11 DIAGNOSIS — K5732 Diverticulitis of large intestine without perforation or abscess without bleeding: Secondary | ICD-10-CM | POA: Diagnosis not present

## 2021-06-11 LAB — CBC WITH DIFFERENTIAL/PLATELET
Basophils Absolute: 0.1 10*3/uL (ref 0.0–0.1)
Basophils Relative: 0.6 % (ref 0.0–3.0)
Eosinophils Absolute: 0.1 10*3/uL (ref 0.0–0.7)
Eosinophils Relative: 1.3 % (ref 0.0–5.0)
HCT: 37.3 % (ref 36.0–46.0)
Hemoglobin: 12.5 g/dL (ref 12.0–15.0)
Lymphocytes Relative: 15.4 % (ref 12.0–46.0)
Lymphs Abs: 1.5 10*3/uL (ref 0.7–4.0)
MCHC: 33.3 g/dL (ref 30.0–36.0)
MCV: 87.6 fl (ref 78.0–100.0)
Monocytes Absolute: 1 10*3/uL (ref 0.1–1.0)
Monocytes Relative: 10 % (ref 3.0–12.0)
Neutro Abs: 6.9 10*3/uL (ref 1.4–7.7)
Neutrophils Relative %: 72.7 % (ref 43.0–77.0)
Platelets: 252 10*3/uL (ref 150.0–400.0)
RBC: 4.26 Mil/uL (ref 3.87–5.11)
RDW: 14.3 % (ref 11.5–15.5)
WBC: 9.5 10*3/uL (ref 4.0–10.5)

## 2021-06-11 LAB — BASIC METABOLIC PANEL
BUN: 9 mg/dL (ref 6–23)
CO2: 26 mEq/L (ref 19–32)
Calcium: 9.8 mg/dL (ref 8.4–10.5)
Chloride: 101 mEq/L (ref 96–112)
Creatinine, Ser: 0.93 mg/dL (ref 0.40–1.20)
GFR: 65.16 mL/min (ref >60.00)
Glucose, Bld: 122 mg/dL — ABNORMAL HIGH (ref 70–99)
Potassium: 4 mEq/L (ref 3.5–5.1)
Sodium: 135 mEq/L (ref 135–145)

## 2021-06-11 MED ORDER — FLUCONAZOLE 100 MG PO TABS: 100.0000 mg | ORAL_TABLET | Freq: Every day | ORAL | 0 refills | Status: AC

## 2021-06-11 NOTE — Progress Notes (Signed)
? ? ? ?06/11/2021 ?Gabrielle Gonzalez ?[id number] ?1955/04/08 ? ? ?Chief Complaint: lower abdominal pain  ? ?History of Present Illness: Gabrielle Gonzalez is a 66 year old female with a past medical history of anxiety, depression, hyperlipidemia, arthritis, osteoporosis, GERD, diverticulitis 2018 and colon polyps. She is followed by Dr. Silverio Decamp.  ? ?She developed left mid abdominal pain which radiated to the LLQ and central lower abdomen on Saturday 06/08/2021. She ate cashews the day before then passed a large formed brown BM on 4/, felt emptied out. She went to church ate 2 bites of grits which resulted in worse lower abdominal pain so she went home, almost went to the ED. She went on a clear liquid diet. She called our on call GI service and spoke with Dr. Rush Landmark who prescribed Cipro '500mg'$  po bid and Flagyl '500mg'$  po bid along with Fluconazole '200mg'$  one po QD x 3 as she reported having thrush when she took Flagyl in the past.  She previously had nausea and stomach upset with Flagyl therefore she was instructed to contact her office if she was unable to tolerate it. She presents today for further follow-up.  Her lower abdominal pain has significantly improved over the past 3 days but has not abated.  She is tolerating the Cipro and Flagyl without any distress.  She took Diflucan 200 mg p.o. daily for 3 days and continues to have white coating on her tongue.  She is pushing fluids and is eating a fairly bland diet.  She also noted having similar lower abdominal pain approximately 1 month ago for which she took an old supply of Cipro 500 mg once daily for 5 days and her abdominal pain abated. ? ?Routine labs done by her PCP 06/05/2021 showed a WBC count of 5.3.  Hemoglobin 13.6. ? ?Her most recent colonoscopy was 04/20/2020, see results below. ? ?Colonoscopy 04/20/2020: ?- One less than 1 mm polyp in the proximal descending colon, removed with a cold biopsy ?forceps. Resected and retrieved. ?- One 18 mm polyp in the ascending  colon, removed with a cold snare and removed piecemeal ?using a cold snare. Resected and retrieved. ?- Diverticulosis in the sigmoid colon and in the descending colon. ?- Non-bleeding external and internal hemorrhoids. ?- 3 year colonoscopy recall ?1. Surgical [P], colon, ascending, polyp ?- SESSILE SERRATED POLYP(S) WITHOUT CYTOLOGIC DYSPLASIA ?2. Surgical [P], colon, descending, polyp ?- TUBULAR ADENOMA WITHOUT HIGH-GRADE DYSPLASIA OR MALIGNANCY ?[name] ? ? ?Past Medical History:  ?Diagnosis Date  ? Anxiety state, unspecified   ? Arthritis   ? Depressive disorder, not elsewhere classified   ? Diverticulosis   ? GERD (gastroesophageal reflux disease)   ? Headache, chronic daily   ? Hiatal hernia   ? Hyperlipidemia   ? Hyperplastic colon polyp   ? Osteoporosis   ? Panic disorder without agoraphobia   ? Rosacea   ? Urinary incontinence   ? Stress incontinence  ? ?Past Surgical History:  ?Procedure Laterality Date  ? BREAST BIOPSY Left 2009  ? CESAREAN SECTION    ? COLONOSCOPY  2019  ? FOOT SURGERY    ? ORIF HUMERUS FRACTURE Right 09/04/2016  ? Procedure: OPEN REDUCTION INTERNAL FIXATION (ORIF) PROXIMAL HUMERUS FRACTURE;  Surgeon: Justice Britain, MD;  Location: [facility];  Service: Orthopedics;  Laterality: Right;  ? ? ?Current Medications, Allergies, Past Medical History, Past Surgical History, Family History and Social History were reviewed in [employer] record. ? ? ?Review of Systems:   ?Constitutional:  Negative for fever, sweats, chills or weight loss.  ?Respiratory: Negative for shortness of breath.   ?Cardiovascular: Negative for chest pain, palpitations and leg swelling.  ?Gastrointestinal: See HPI.  ?Musculoskeletal: Negative for back pain or muscle aches.  ?Neurological: Negative for dizziness, headaches or paresthesias.  ? ? ?Physical Exam: ?LMP 06/18/2011  ?BP 110/60   Pulse 72   Ht 5' 1.05" (1.551 m)   Wt 131 lb (59.4 kg)   LMP 06/18/2011   SpO2 97%   BMI 24.71 kg/m?   ? ?General: 67 year old female in no acute distress. ?Head: Normocephalic and atraumatic. ?Eyes: No scleral icterus. Conjunctiva pink . ?Ears: Normal auditory acuity. ?Mouth: Dentition intact. No ulcers or lesions.  ?Lungs: Clear throughout to auscultation. ?Heart: Regular rate and rhythm, no murmur. ?Abdomen: Soft, nondistended.  Mild tenderness throughout the lower abdomen without rebound or guarding.  Negative shake tenderness.  No masses or hepatomegaly. Normal bowel sounds x 4 quadrants.  ?Rectal: Deferred. ?Musculoskeletal: Symmetrical with no gross deformities. ?Extremities: No edema. ?Neurological: Alert oriented x 4. No focal deficits.  ?Psychological: Alert and cooperative. Normal mood and affect ? ?Assessment and Recommendations: ? ?40) 66 year old female with a history of diverticulitis in 2018 with lower abdominal pain which started on 06/08/2021, suspect diverticulitis.  Her lower abdominal pain has improved since she started taking Cipro and Flagyl 500 mg twice daily started on 06/08/2021. ?-CBC and BMP ?-Patient to contact her office if her lower abdominal pain worsens, CTAP with oral and IV contrast will be ordered if her symptoms worsen ?-Patient instructed go to the ED if she develops severe abdominal pain ?-Push fluids, advance diet as tolerated ?-MiraLAX nightly if constipation occurs ? ?2) History of an 18 mm sessile serrated polyp removed from the ascending colon and a < 1 mm polyp removed from the proximal descending colon per colonoscopy 04/2020.  Father was diagnosed with colon cancer at the age of 53. ?-Next colonoscopy due 04/2023 ? ?3) Oral thrush.  Patient completed Diflucan 20 mg 1 p.o. daily x 3. ?-Diflucan 100 mg 1 tab p.o. daily x7 ? ?

## 2021-06-11 NOTE — Patient Instructions (Signed)
1) Contact our office if your abdominal pain worsens  ? ?2) Go to the Emergency room if your develop severe abdominal pain  ? ?3) Push fluids, advance diet as tolerated  ? ?4) I will order an abdominal/pelvic CT scan if your abdominal pain worsens or does not 100% resolve ? ?5) Fluconazole '100mg'$  one tab by mouth once daily x 7 days for oral thrush  ?

## 2021-06-11 NOTE — Progress Notes (Signed)
Reviewed and agree with documentation and assessment and plan. K. Veena Finneas Mathe , MD   

## 2021-07-18 ENCOUNTER — Other Ambulatory Visit: Payer: Self-pay

## 2021-07-18 DIAGNOSIS — R1084 Generalized abdominal pain: Secondary | ICD-10-CM

## 2021-07-18 NOTE — Telephone Encounter (Signed)
SECOND ATTEMPT: ? ?Appears pt has been scheduled for CT 07/26/21. Urgent message sent to schedulers advising to move up CT. Called pt to inquire further about her symptoms. States he abd pain is tolerable and does not feel she needs to proceed to the ED. Denies having any mucous of blood in her stool but is having loose brown stools approx 2-3 times in a day. Denies having any fevers at this time. Advised about need for her to complete labs. States she will proceed to the lab tomorrow to complete. When asked about scheduling of CT, pt states scheduler advised they needed to wait 1 week to allow time for auth. Advised that was inaccurate information as the CT has been auth'd until 6/9. Advised pt I have sent an urgent message advising staff to reschedule CT ASAP. Also asked pt to call at the end of our call to reschedule CT ASAP. Verbalized acceptance and understanding. Advised she stay well hydrated with 64 ounces of fluid daily. To reduce risk for electrolyte imbalance, advised she ensure she drinks some fluids that contain electrolytes. Advised she proceed to the ED if she develops worsening abd pain, any blood in her stool and any fevers not controlled by Tylenol. Verbalized acceptance and understanding. ? ?Prior to ending this entry, noticed CT has been rescheduled to Monday (07/22/21). Routing this message to Carl Best, CRNP for continuity of care. ?

## 2021-07-18 NOTE — Telephone Encounter (Signed)
Gabrielle Gonzalez, pls contact patient. Pls send her to our lab for a CBC and CMP as her last labs were one month ago and she continues to have lower abdominal pain. Schedule her for an abdominal/pelvic CT scan asap. Let me know when scheduled. Pls verify with patient what type of stools she is passing. Patient to go to the ED if she develops severe abdominal pain. THX ?

## 2021-07-19 ENCOUNTER — Other Ambulatory Visit (INDEPENDENT_AMBULATORY_CARE_PROVIDER_SITE_OTHER): Payer: BC Managed Care – PPO

## 2021-07-19 DIAGNOSIS — R1084 Generalized abdominal pain: Secondary | ICD-10-CM

## 2021-07-19 LAB — COMPREHENSIVE METABOLIC PANEL
ALT: 14 U/L (ref 0–35)
AST: 15 U/L (ref 0–37)
Albumin: 4 g/dL (ref 3.5–5.2)
Alkaline Phosphatase: 41 U/L (ref 39–117)
BUN: 11 mg/dL (ref 6–23)
CO2: 26 mEq/L (ref 19–32)
Calcium: 9.4 mg/dL (ref 8.4–10.5)
Chloride: 104 mEq/L (ref 96–112)
Creatinine, Ser: 0.86 mg/dL (ref 0.40–1.20)
GFR: 71.53 mL/min (ref >60.00)
Glucose, Bld: 104 mg/dL — ABNORMAL HIGH (ref 70–99)
Potassium: 3.8 mEq/L (ref 3.5–5.1)
Sodium: 138 mEq/L (ref 135–145)
Total Bilirubin: 0.5 mg/dL (ref 0.2–1.2)
Total Protein: 7.2 g/dL (ref 6.0–8.3)

## 2021-07-19 LAB — CBC
HCT: 35.6 % — ABNORMAL LOW (ref 36.0–46.0)
Hemoglobin: 11.9 g/dL — ABNORMAL LOW (ref 12.0–15.0)
MCHC: 33.3 g/dL (ref 30.0–36.0)
MCV: 87.3 fl (ref 78.0–100.0)
Platelets: 348 10*3/uL (ref 150.0–400.0)
RBC: 4.07 Mil/uL (ref 3.87–5.11)
RDW: 13.4 % (ref 11.5–15.5)
WBC: 10.3 10*3/uL (ref 4.0–10.5)

## 2021-07-22 ENCOUNTER — Encounter (HOSPITAL_COMMUNITY): Payer: Self-pay

## 2021-07-22 ENCOUNTER — Other Ambulatory Visit: Payer: Self-pay

## 2021-07-22 ENCOUNTER — Ambulatory Visit (HOSPITAL_COMMUNITY)
Admission: RE | Admit: 2021-07-22 | Discharge: 2021-07-22 | Disposition: A | Discharge status: Home / Self Care | Payer: BC Managed Care – PPO | Source: Ambulatory Visit | Attending: Nurse Practitioner | Admitting: Nurse Practitioner

## 2021-07-22 ENCOUNTER — Emergency Department (HOSPITAL_COMMUNITY): Payer: BC Managed Care – PPO

## 2021-07-22 ENCOUNTER — Telehealth: Payer: Self-pay

## 2021-07-22 ENCOUNTER — Inpatient Hospital Stay (HOSPITAL_COMMUNITY)
Admission: EM | Admit: 2021-07-22 | Discharge: 2021-07-24 | DRG: 377 | Disposition: A | Discharge status: Home / Self Care | Payer: BC Managed Care – PPO | Attending: Internal Medicine | Admitting: Internal Medicine

## 2021-07-22 DIAGNOSIS — K219 Gastro-esophageal reflux disease without esophagitis: Secondary | ICD-10-CM | POA: Diagnosis present

## 2021-07-22 DIAGNOSIS — I10 Essential (primary) hypertension: Secondary | ICD-10-CM | POA: Diagnosis present

## 2021-07-22 DIAGNOSIS — K651 Peritoneal abscess: Secondary | ICD-10-CM | POA: Diagnosis present

## 2021-07-22 DIAGNOSIS — F32A Depression, unspecified: Secondary | ICD-10-CM | POA: Diagnosis present

## 2021-07-22 DIAGNOSIS — K5721 Diverticulitis of large intestine with perforation and abscess with bleeding: Principal | ICD-10-CM | POA: Diagnosis present

## 2021-07-22 DIAGNOSIS — E860 Dehydration: Secondary | ICD-10-CM | POA: Diagnosis present

## 2021-07-22 DIAGNOSIS — F4322 Adjustment disorder with anxiety: Secondary | ICD-10-CM | POA: Diagnosis present

## 2021-07-22 DIAGNOSIS — M810 Age-related osteoporosis without current pathological fracture: Secondary | ICD-10-CM | POA: Diagnosis present

## 2021-07-22 DIAGNOSIS — Z8249 Family history of ischemic heart disease and other diseases of the circulatory system: Secondary | ICD-10-CM | POA: Diagnosis not present

## 2021-07-22 DIAGNOSIS — K5720 Diverticulitis of large intestine with perforation and abscess without bleeding: Secondary | ICD-10-CM

## 2021-07-22 DIAGNOSIS — Z881 Allergy status to other antibiotic agents status: Secondary | ICD-10-CM | POA: Diagnosis not present

## 2021-07-22 DIAGNOSIS — Z880 Allergy status to penicillin: Secondary | ICD-10-CM | POA: Diagnosis not present

## 2021-07-22 DIAGNOSIS — E785 Hyperlipidemia, unspecified: Secondary | ICD-10-CM | POA: Diagnosis present

## 2021-07-22 DIAGNOSIS — Z79899 Other long term (current) drug therapy: Secondary | ICD-10-CM

## 2021-07-22 DIAGNOSIS — E782 Mixed hyperlipidemia: Secondary | ICD-10-CM | POA: Diagnosis not present

## 2021-07-22 DIAGNOSIS — Z888 Allergy status to other drugs, medicaments and biological substances status: Secondary | ICD-10-CM

## 2021-07-22 DIAGNOSIS — Z79818 Long term (current) use of other agents affecting estrogen receptors and estrogen levels: Secondary | ICD-10-CM

## 2021-07-22 DIAGNOSIS — R1084 Generalized abdominal pain: Secondary | ICD-10-CM | POA: Insufficient documentation

## 2021-07-22 HISTORY — DX: Diverticulitis of large intestine with perforation and abscess without bleeding: K57.20

## 2021-07-22 LAB — CBC
HCT: 37.5 % (ref 36.0–46.0)
Hemoglobin: 12.2 g/dL (ref 12.0–15.0)
MCH: 29.5 pg (ref 26.0–34.0)
MCHC: 32.5 g/dL (ref 30.0–36.0)
MCV: 90.8 fL (ref 80.0–100.0)
Platelets: 425 10*3/uL — ABNORMAL HIGH (ref 150–400)
RBC: 4.13 MIL/uL (ref 3.87–5.11)
RDW: 12.9 % (ref 11.5–15.5)
WBC: 10.8 10*3/uL — ABNORMAL HIGH (ref 4.0–10.5)
nRBC: 0 % (ref 0.0–0.2)

## 2021-07-22 LAB — COMPREHENSIVE METABOLIC PANEL
ALT: 16 U/L (ref 0–44)
AST: 35 U/L (ref 15–41)
Albumin: 3.7 g/dL (ref 3.5–5.0)
Alkaline Phosphatase: 39 U/L (ref 38–126)
Anion gap: 8 (ref 5–15)
BUN: 16 mg/dL (ref 8–23)
CO2: 25 mmol/L (ref 22–32)
Calcium: 8.9 mg/dL (ref 8.9–10.3)
Chloride: 101 mmol/L (ref 98–111)
Creatinine, Ser: 0.84 mg/dL (ref 0.44–1.00)
GFR, Estimated: >60 mL/min (ref >60)
Glucose, Bld: 113 mg/dL — ABNORMAL HIGH (ref 70–99)
Potassium: 4.5 mmol/L (ref 3.5–5.1)
Sodium: 134 mmol/L — ABNORMAL LOW (ref 135–145)
Total Bilirubin: 1.3 mg/dL — ABNORMAL HIGH (ref 0.3–1.2)
Total Protein: 7.8 g/dL (ref 6.5–8.1)

## 2021-07-22 LAB — URINALYSIS, ROUTINE W REFLEX MICROSCOPIC
Bilirubin Urine: NEGATIVE
Glucose, UA: NEGATIVE mg/dL
Hgb urine dipstick: NEGATIVE
Ketones, ur: NEGATIVE mg/dL
Leukocytes,Ua: NEGATIVE
Nitrite: NEGATIVE
Protein, ur: NEGATIVE mg/dL
Specific Gravity, Urine: >1.046 — ABNORMAL HIGH (ref 1.005–1.030)
pH: 7 (ref 5.0–8.0)

## 2021-07-22 LAB — LIPASE, BLOOD: Lipase: 42 U/L (ref 11–51)

## 2021-07-22 MED ORDER — SODIUM CHLORIDE 0.9 % IV SOLN
2.0000 g | Freq: Once | INTRAVENOUS | Status: AC
  Administered 2021-07-22: 2 g via INTRAVENOUS
  Filled 2021-07-22: qty 20

## 2021-07-22 MED ORDER — FENOFIBRATE 160 MG PO TABS: 160.0000 mg | ORAL_TABLET | Freq: Every day | ORAL | Status: DC

## 2021-07-22 MED ORDER — MORPHINE SULFATE (PF) 2 MG/ML IV SOLN: 2.0000 mg | INTRAVENOUS | Status: DC | PRN

## 2021-07-22 MED ORDER — VENLAFAXINE HCL 75 MG PO TABS
75.0000 mg | ORAL_TABLET | Freq: Every morning | ORAL | Status: DC
  Filled 2021-07-22 (×2): qty 1

## 2021-07-22 MED ORDER — METOPROLOL TARTRATE 5 MG/5ML IV SOLN: 5.0000 mg | Freq: Four times a day (QID) | INTRAVENOUS | Status: DC | PRN

## 2021-07-22 MED ORDER — SODIUM CHLORIDE (PF) 0.9 % IJ SOLN
INTRAMUSCULAR | Status: AC
  Filled 2021-07-22: qty 50

## 2021-07-22 MED ORDER — FENTANYL CITRATE (PF) 100 MCG/2ML IJ SOLN
INTRAMUSCULAR | Status: AC
  Filled 2021-07-22: qty 2

## 2021-07-22 MED ORDER — ONDANSETRON HCL 4 MG PO TABS: 4.0000 mg | ORAL_TABLET | Freq: Four times a day (QID) | ORAL | Status: DC | PRN

## 2021-07-22 MED ORDER — SODIUM CHLORIDE 0.9 % IV SOLN: INTRAVENOUS | Status: DC

## 2021-07-22 MED ORDER — FENTANYL CITRATE (PF) 100 MCG/2ML IJ SOLN
INTRAMUSCULAR | Status: AC | PRN
  Administered 2021-07-22 (×2): 50 ug via INTRAVENOUS

## 2021-07-22 MED ORDER — ACETAMINOPHEN 325 MG PO TABS
650.0000 mg | ORAL_TABLET | Freq: Four times a day (QID) | ORAL | Status: DC | PRN
  Administered 2021-07-22: 650 mg via ORAL
  Filled 2021-07-22: qty 2

## 2021-07-22 MED ORDER — FENOFIBRATE 200 MG PO CAPS: 200.0000 mg | ORAL_CAPSULE | Freq: Every day | ORAL | Status: DC

## 2021-07-22 MED ORDER — MIDAZOLAM HCL 2 MG/2ML IJ SOLN
INTRAMUSCULAR | Status: AC
  Filled 2021-07-22: qty 4

## 2021-07-22 MED ORDER — MIDAZOLAM HCL 2 MG/2ML IJ SOLN
INTRAMUSCULAR | Status: AC | PRN
  Administered 2021-07-22 (×2): 1 mg via INTRAVENOUS

## 2021-07-22 MED ORDER — METRONIDAZOLE 500 MG/100ML IV SOLN
500.0000 mg | Freq: Once | INTRAVENOUS | Status: AC
  Administered 2021-07-22: 500 mg via INTRAVENOUS
  Filled 2021-07-22: qty 100

## 2021-07-22 MED ORDER — METOPROLOL SUCCINATE ER 25 MG PO TB24: 25.0000 mg | ORAL_TABLET | Freq: Every day | ORAL | Status: DC

## 2021-07-22 MED ORDER — ONDANSETRON HCL 4 MG/2ML IJ SOLN
4.0000 mg | Freq: Four times a day (QID) | INTRAMUSCULAR | Status: DC | PRN
Start: 2021-07-22 — End: 2021-07-24

## 2021-07-22 MED ORDER — IOHEXOL 300 MG/ML  SOLN
100.0000 mL | Freq: Once | INTRAMUSCULAR | Status: AC | PRN
  Administered 2021-07-22: 100 mL via INTRAVENOUS

## 2021-07-22 MED ORDER — METRONIDAZOLE 500 MG/100ML IV SOLN
500.0000 mg | Freq: Two times a day (BID) | INTRAVENOUS | Status: DC
  Administered 2021-07-22 – 2021-07-24 (×4): 500 mg via INTRAVENOUS
  Filled 2021-07-22 (×4): qty 100

## 2021-07-22 MED ORDER — SODIUM CHLORIDE 0.9 % IV SOLN
2.0000 g | INTRAVENOUS | Status: DC
  Administered 2021-07-23 – 2021-07-24 (×2): 2 g via INTRAVENOUS
  Filled 2021-07-22 (×2): qty 20

## 2021-07-22 NOTE — H&P (Signed)
?History and Physical  ? ? ?Patient: Gabrielle Gonzalez PNT:[id number] DOB: [birth year] ?DOA: 07/22/2021 ?DOS: the patient was seen and examined on 07/22/2021 ?PCP: Berkley Harvey, NP  ?Patient coming from: Home ? ?Chief Complaint: Abdominal pain. ? ?HPI: Gabrielle Gonzalez is a 66 y.o. female with medical history significant of HTN, GERD, Anxiety, HLD. Presenting with abdominal pain. She had a bout of diverticulitis over a month ago. She recovered and was doing ok until about 3 weeks ago. Her LLQ abdominal pain returned. She was able to tolerate it with the use of APAP; however, she did not have complete relief. She contacted her GI docs a week ago and reported her abdominal pain had returned. They arranged for an outpt CT. She completed that imaging today. It showed a diverticular abscess. They recommended that she come to the ED for evaluation. She denies fever, N/V, D. She denies any other aggravating or alleviating factors.   ? ?Review of Systems: As mentioned in the history of present illness. All other systems reviewed and are negative. ?Past Medical History:  ?Diagnosis Date  ? Anxiety state, unspecified   ? Arthritis   ? Depressive disorder, not elsewhere classified   ? Diverticulosis   ? GERD (gastroesophageal reflux disease)   ? Headache, chronic daily   ? Hiatal hernia   ? Hyperlipidemia   ? Hyperplastic colon polyp   ? Osteoporosis   ? Panic disorder without agoraphobia   ? Rosacea   ? Urinary incontinence   ? Stress incontinence  ? ?Past Surgical History:  ?Procedure Laterality Date  ? BREAST BIOPSY Left 2009  ? CESAREAN SECTION    ? COLONOSCOPY  2019  ? FOOT SURGERY    ? ORIF HUMERUS FRACTURE Right 09/04/2016  ? Procedure: OPEN REDUCTION INTERNAL FIXATION (ORIF) PROXIMAL HUMERUS FRACTURE;  Surgeon: Justice Britain, MD;  Location: [place];  Service: Orthopedics;  Laterality: Right;  ? ?Social History:  reports that she has never smoked. She has never used smokeless tobacco. She reports current alcohol use. She reports  that she does not use drugs. ? ?Allergies  ?Allergen Reactions  ? Penicillins   ?  PATIENT HAS HAD ANCEF WITHOUT REACTIONS ?> > > OK TO GIVE ANCEF WHEN ORDERED, PER MD < < <   09/04/16 ? ?Has patient had a PCN reaction causing immediate rash, facial/tongue/throat swelling, SOB or lightheadedness with hypotension: Unknown ?Has patient had a PCN reaction causing severe rash involving mucus membranes or skin necrosis: Unknown ?Has patient had a PCN reaction that required hospitalization: Unknown ?Has patient had a PCN reaction occurring within the last 10 years: No ?If all of the above an  ? Erythromycin Nausea And Vomiting  ? Lodine [Etodolac] Other (See Comments)  ?  GI burning   ? ? ?Family History  ?Problem Relation Age of Onset  ? Prostate cancer Father   ? Diabetes Father   ? Colon cancer Father   ? Liver cancer Father   ?     mets from colon  ? Hypertension Mother   ? Other Mother   ?     Carcinoid tumor of intestine  ? Colon cancer Mother   ? Hypertension Sister   ? Breast cancer Neg Hx   ? Colon polyps Neg Hx   ? Esophageal cancer Neg Hx   ? Rectal cancer Neg Hx   ? Stomach cancer Neg Hx   ? ? ?Prior to Admission medications   ?Medication Sig Start Date End Date Taking? Authorizing  Provider  ?B Complex Vitamins (VITAMIN B COMPLEX PO) Take 1 capsule by mouth daily.    [provider]  ?Cholecalciferol (VITAMIN D3) 2000 units capsule Take 2,000 Units by mouth daily.    [provider]  ?ciprofloxacin (CIPRO) 500 MG tablet Take 1 tablet (500 mg total) by mouth 2 (two) times daily. 06/08/21   Mansouraty, Telford Nab., MD  ?diclofenac Sodium (VOLTAREN) 1 % GEL  03/20/21   [provider]  ?estradiol (ESTRACE) 0.1 MG/GM vaginal cream  02/15/20   [provider]  ?fenofibrate micronized (LOFIBRA) 200 MG capsule  03/28/21   [provider]  ?Magnesium 250 MG TABS Take 250 mg by mouth daily.     [provider]  ?meloxicam (MOBIC) 7.5 MG tablet Take by mouth. 03/20/21    [provider]  ?metoprolol succinate (TOPROL-XL) 25 MG 24 hr tablet Take 1 tablet (25 mg total) by mouth daily. Take with or immediately following a meal. 04/08/21 04/03/22  Buford Dresser, MD  ?metroNIDAZOLE (FLAGYL) 500 MG tablet Take 1 tablet (500 mg total) by mouth 2 (two) times daily. 06/08/21   Mansouraty, Telford Nab., MD  ?multivitamin-lutein [facility]) CAPS Take 1 capsule by mouth daily.    [provider]  ?omeprazole (PRILOSEC) 40 MG capsule Take 1 capsule (40 mg total) by mouth daily. 01/16/20   Mauri Pole, MD  ?sucralfate (CARAFATE) 1 GM/10ML suspension Take 1 g by mouth 4 (four) times daily. 03/16/20   [provider]  ?tretinoin (RETIN-A) 0.1 % cream  03/10/18   [provider]  ?venlafaxine (EFFEXOR) 75 MG tablet Take 75 mg by mouth daily.    [provider]  ? ? ?Physical Exam: ?Vitals:  ? 07/22/21 0915 07/22/21 0916 07/22/21 1030  ?BP: (!) 136/103  (!) 127/91  ?Pulse: 82  73  ?Resp: 18  16  ?Temp: 98.5 ?F (36.9 ?C)    ?TempSrc: Oral    ?SpO2: 100%  98%  ?Weight:  56.7 kg   ?Height:  5' 1.5" (1.562 m)   ? ?General: 66 y.o. female resting in bed in NAD ?Eyes: PERRL, normal sclera ?ENMT: Nares patent w/o discharge, orophaynx clear, dentition normal, ears w/o discharge/lesions/ulcers ?Neck: Supple, trachea midline ?Cardiovascular: RRR, +S1, S2, no m/g/r, equal pulses throughout ?Respiratory: CTABL, no w/r/r, normal WOB ?GI: BS+, ND, mild TTP LLQ, no masses noted, no organomegaly noted ?MSK: No e/c/c ?Neuro: A&O x 3, no focal deficits ?Psyc: Appropriate interaction and affect, calm/cooperative ? ?Data Reviewed: ? ?Na+  134 ?Scr  0.84 ?WBC  10.8 ? ?CT ab/pelvis w/ ?1. Diverticular abscess adjacent to the sigmoid colon. These results will be called to the ordering clinician or representative by the Radiologist Assistant, and communication documented in the PACS or [employer]. ?2. Steatotic enlarged liver. ?3. Large hiatal hernia. ?4.   Aortic atherosclerosis (ICD10-I70.0). ? ?Assessment and Plan: ?Diverticular abscess ?    - admit to inpt, med-surg ?    - continue rocephin, flagyl, fluids ?    - [facility], IR and [facility] onboard, appreciate assistance ?    - remainder of plan per them ?    - PRN pain control ?    - NPO for now, pending consultant eval ? ?HLD ?    - resume home regimen when off NPO status ? ?GERD ?    - PPI ? ?Anxiety ?    - resume home regimen when off NPO status ? ?HTN ?    - resume home regimen when off  NPO status ?    - will have PRN metoprolol ? ? ?Advance Care Planning:   Code Status: FULL ? ?Consults: [facility], IR ? ?Family Communication: None at bedside ? ?Severity of Illness: ?The appropriate patient status for this patient is INPATIENT. Inpatient status is judged to be reasonable and necessary in order to provide the required intensity of service to ensure the patient's safety. The patient's presenting symptoms, physical exam findings, and initial radiographic and laboratory data in the context of their chronic comorbidities is felt to place them at high risk for further clinical deterioration. Furthermore, it is not anticipated that the patient will be medically stable for discharge from the hospital within 2 midnights of admission.  ? ?* I certify that at the point of admission it is my clinical judgment that the patient will require inpatient hospital care spanning beyond 2 midnights from the point of admission due to high intensity of service, high risk for further deterioration and high frequency of surveillance required.* ? ?Author: ?Jonnie Finner, DO ?07/22/2021 11:06 AM ? ?For on call review [email or web address].  ?

## 2021-07-22 NOTE — Progress Notes (Signed)
Patient verbalized that she took her home med metoprolol and venlafaxine. ?

## 2021-07-22 NOTE — Telephone Encounter (Signed)
Following CT results received: ? ?IMPRESSION: ?1. Diverticular abscess adjacent to the sigmoid colon. These results ?will be called to the ordering clinician or representative by the ?Psychologist, clinical, and communication documented in the PACS or ?[employer]. ?2. Steatotic enlarged liver. ?3. Large hiatal hernia. ?4.  Aortic atherosclerosis (ICD10-I70.0). ? ?Carl Best, CRNP is out of the office today. Routing this message to Dr. Silverio Decamp to review and to advise. ?

## 2021-07-22 NOTE — Consult Note (Signed)
? ? ? ?Gabrielle Gonzalez ?1955-08-18  ?[id number].   ? ?Requesting MD: Ellouise Newer, PA-C  ?Chief Complaint/Reason for Consult: diverticular abscess ? ?HPI:  ?Ms. Gabrielle Gonzalez is a 66 y/o F with a PMH HLD, GERD, anxiety, and diverticulitis who presented to the ED at the direction of [facility] office due to abnormal CT scan. She reports intermittent LLQ pain for over one month. She was given two courses of PO abx for diverticulitis in April. After stopping abx she developed worsening LLQ discomfort and so she called her GI physician who ordered CT of the abdomen/pelvis. Scan showed a diverticular abscess so she presented to ED. She denies fever, chills, nausea, vomiting, SOB, diarrhea or constipation. Her last BM was today and was loose which she attributes to PO barium required for CT scan. She denies tobacco use. Denies use of blood thinners. Her past abdominal surgeries include c-section. She states her mother had colon cancer. She has a personal history of colon polyps and gets colonoscopies every 3 years.  ? ?Her last colonoscopy was one year ago (04/20/2020) by Dr. Bari Edward as below:  ? ?- The perianal and digital rectal examinations were normal. ?- A less than 1 mm polyp was found in the proximal descending colon. The polyp was sessile. ?The polyp was removed with a cold biopsy forceps. Resection and retrieval were complete. ?- A 18 mm polyp was found in the ascending colon. The polyp was flat. The polyp was ?removed with a cold snare. The polyp was removed with a piecemeal technique using a cold ?snare. Resection and retrieval were complete. ?- Multiple small and large-mouthed diverticula were found in the sigmoid colon and ?descending colon. ?- Non-bleeding external and internal hemorrhoids were found during retroflexion. The ?hemorrhoids were small. ? ?Path:  ?1. Surgical [P], colon, ascending, polyp ?- SESSILE SERRATED POLYP(S) WITHOUT CYTOLOGIC DYSPLASIA ?2. Surgical [P], colon, descending, polyp ?- TUBULAR  ADENOMA WITHOUT HIGH-GRADE DYSPLASIA OR MALIGNANCY ? ?ROS:As above ?Review of Systems  ?All other systems reviewed and are negative. ? ?Family History  ?Problem Relation Age of Onset  ? Prostate cancer Father   ? Diabetes Father   ? Colon cancer Father   ? Liver cancer Father   ?     mets from colon  ? Hypertension Mother   ? Other Mother   ?     Carcinoid tumor of intestine  ? Colon cancer Mother   ? Hypertension Sister   ? Breast cancer Neg Hx   ? Colon polyps Neg Hx   ? Esophageal cancer Neg Hx   ? Rectal cancer Neg Hx   ? Stomach cancer Neg Hx   ? ? ?Past Medical History:  ?Diagnosis Date  ? Anxiety state, unspecified   ? Arthritis   ? Depressive disorder, not elsewhere classified   ? Diverticulosis   ? GERD (gastroesophageal reflux disease)   ? Headache, chronic daily   ? Hiatal hernia   ? Hyperlipidemia   ? Hyperplastic colon polyp   ? Osteoporosis   ? Panic disorder without agoraphobia   ? Rosacea   ? Urinary incontinence   ? Stress incontinence  ? ? ?Past Surgical History:  ?Procedure Laterality Date  ? BREAST BIOPSY Left 2009  ? CESAREAN SECTION    ? COLONOSCOPY  2019  ? FOOT SURGERY    ? ORIF HUMERUS FRACTURE Right 09/04/2016  ? Procedure: OPEN REDUCTION INTERNAL FIXATION (ORIF) PROXIMAL HUMERUS FRACTURE;  Surgeon: Justice Britain, MD;  Location: [place];  Service:  Orthopedics;  Laterality: Right;  ? ? ?Social History:  reports that she has never smoked. She has never used smokeless tobacco. She reports current alcohol use. She reports that she does not use drugs. ? ?Allergies:  ?Allergies  ?Allergen Reactions  ? Penicillins   ?  PATIENT HAS HAD ANCEF WITHOUT REACTIONS ?> > > OK TO GIVE ANCEF WHEN ORDERED, PER MD < < <   09/04/16 ? ?Has patient had a PCN reaction causing immediate rash, facial/tongue/throat swelling, SOB or lightheadedness with hypotension: Unknown ?Has patient had a PCN reaction causing severe rash involving mucus membranes or skin necrosis: Unknown ?Has patient had a PCN reaction that required  hospitalization: Unknown ?Has patient had a PCN reaction occurring within the last 10 years: No ?If all of the above an  ? Erythromycin Nausea And Vomiting  ? Lodine [Etodolac] Other (See Comments)  ?  GI burning   ? ? ?Medications Prior to Admission  ?Medication Sig Dispense Refill  ? B Complex Vitamins (VITAMIN B COMPLEX PO) Take 1 capsule by mouth daily.    ? Cholecalciferol (VITAMIN D3) 2000 units capsule Take 2,000 Units by mouth daily.    ? ciprofloxacin (CIPRO) 500 MG tablet Take 1 tablet (500 mg total) by mouth 2 (two) times daily. 20 tablet 0  ? diclofenac Sodium (VOLTAREN) 1 % GEL     ? estradiol (ESTRACE) 0.1 MG/GM vaginal cream     ? fenofibrate micronized (LOFIBRA) 200 MG capsule     ? Magnesium 250 MG TABS Take 250 mg by mouth daily.     ? meloxicam (MOBIC) 7.5 MG tablet Take by mouth.    ? metoprolol succinate (TOPROL-XL) 25 MG 24 hr tablet Take 1 tablet (25 mg total) by mouth daily. Take with or immediately following a meal. 90 tablet 3  ? metroNIDAZOLE (FLAGYL) 500 MG tablet Take 1 tablet (500 mg total) by mouth 2 (two) times daily. 20 tablet 0  ? multivitamin-lutein (OCUVITE-LUTEIN) CAPS Take 1 capsule by mouth daily.    ? omeprazole (PRILOSEC) 40 MG capsule Take 1 capsule (40 mg total) by mouth daily. 30 capsule 11  ? sucralfate (CARAFATE) 1 GM/10ML suspension Take 1 g by mouth 4 (four) times daily.    ? tretinoin (RETIN-A) 0.1 % cream     ? venlafaxine (EFFEXOR) 75 MG tablet Take 75 mg by mouth daily.    ? ? ? ?Physical Exam: ?Blood pressure 131/80, pulse 88, temperature 98.3 ?F (36.8 ?C), temperature source Oral, resp. rate 15, height 5' 1.5" (1.562 m), weight 56.7 kg, last menstrual period 06/18/2011, SpO2 100 %. ?General: Pleasant white female  laying on hospital bed, appears stated age, NAD. ?HEENT: head -normocephalic, atraumatic; Eyes: PERRLA, no conjunctival injection ?Neck- Trachea is midline, no thyromegaly or JVD appreciated.  ?CV- RRR, normal S1/S2, no M/R/G, radial and dorsalis pedis  pulses 2+ BL, cap refill < 2 seconds. ?Pulm- breathing is non-labored. CTABL, no wheezes, rhales, rhonchi. ?Abd- soft, overall non-tender, no rebound, no guarding, non-distended, +BS ?GU- deferred  ?MSK- UE/LE symmetrical, no cyanosis, clubbing, or edema. ?Neuro- CN II-XII grossly in tact, gait not assessed  ?Psych- Alert and Oriented x3 with appropriate affect ?Skin: warm and dry, no rashes or lesions ? ? ?Results for orders placed or performed during the hospital encounter of 07/22/21 (from the past 48 hour(s))  ?Lipase, blood     Status: None  ? Collection Time: 07/22/21  9:22 AM  ?Result Value Ref Range  ? Lipase 42 11 - 51  U/L  ?  Comment: Performed at [facility], [place] [address] [place], [place] [address]  ?Comprehensive metabolic panel     Status: Abnormal  ? Collection Time: 07/22/21  9:22 AM  ?Result Value Ref Range  ? Sodium 134 (L) 135 - 145 mmol/L  ? Potassium 4.5 3.5 - 5.1 mmol/L  ? Chloride 101 98 - 111 mmol/L  ? CO2 25 22 - 32 mmol/L  ? Glucose, Bld 113 (H) 70 - 99 mg/dL  ?  Comment: Glucose reference range applies only to samples taken after fasting for at least 8 hours.  ? BUN 16 8 - 23 mg/dL  ? Creatinine, Ser 0.84 0.44 - 1.00 mg/dL  ? Calcium 8.9 8.9 - 10.3 mg/dL  ? Total Protein 7.8 6.5 - 8.1 g/dL  ? Albumin 3.7 3.5 - 5.0 g/dL  ? AST 35 15 - 41 U/L  ? ALT 16 0 - 44 U/L  ? Alkaline Phosphatase 39 38 - 126 U/L  ? Total Bilirubin 1.3 (H) 0.3 - 1.2 mg/dL  ? GFR, Estimated >60 >60 mL/min  ?  Comment: (NOTE) ?Calculated using the CKD-EPI Creatinine Equation (2021) ?  ? Anion gap 8 5 - 15  ?  Comment: Performed at [facility], [place] [address] [place], [place] [address]  ?CBC     Status: Abnormal  ? Collection Time: 07/22/21  9:22 AM  ?Result Value Ref Range  ? WBC 10.8 (H) 4.0 - 10.5 K/uL  ? RBC 4.13 3.87 - 5.11 MIL/uL  ? Hemoglobin 12.2 12.0 - 15.0 g/dL  ? HCT 37.5 36.0 - 46.0 %  ? MCV 90.8 80.0 - 100.0 fL  ? MCH 29.5 26.0 - 34.0 pg  ? MCHC 32.5 30.0 - 36.0 g/dL   ? RDW 12.9 11.5 - 15.5 %  ? Platelets 425 (H) 150 - 400 K/uL  ? nRBC 0.0 0.0 - 0.2 %  ?  Comment: Performed at [facility], [place] [address] [place], [place] [address]  ?Graylon Gunning

## 2021-07-22 NOTE — ED Provider Notes (Signed)
?[facility] DEPT ?Provider Note ? ? ?CSN: [id number] ?Arrival date & time: 07/22/21  0908 ? ?  ? ?History ? ?No chief complaint on file. ? ? ?Gabrielle Gonzalez is a 66 y.o. female. ? ?HPI ?Patient presents from home with her husband with concern of ongoing abdominal pain and abnormal outpatient CT scan.  History is obtained by patient, husband, chart review.  She notes that she is generally well, does have a history of diverticulosis diagnosed on colonoscopy.  However, no history of diverticulitis. ?About 1 month ago she had an episode of acute abdominal pain, change in bowel movements.  This improved somewhat, but since that time she has had inconsistent bowel movements, persistent left lower quadrant abdominal pain.  With worsening pain she was referred for outpatient CT, and today was sent here after those results were reportedly abnormal.  No current fever, no other pain, pain is focally left lower quadrant. ?  ? ?Home Medications ?Prior to Admission medications   ?Medication Sig Start Date End Date Taking? Authorizing Provider  ?B Complex Vitamins (VITAMIN B COMPLEX PO) Take 1 capsule by mouth daily.    [provider]  ?Cholecalciferol (VITAMIN D3) 2000 units capsule Take 2,000 Units by mouth daily.    [provider]  ?ciprofloxacin (CIPRO) 500 MG tablet Take 1 tablet (500 mg total) by mouth 2 (two) times daily. 06/08/21   Mansouraty, Telford Nab., MD  ?diclofenac Sodium (VOLTAREN) 1 % GEL  03/20/21   [provider]  ?estradiol (ESTRACE) 0.1 MG/GM vaginal cream  02/15/20   [provider]  ?fenofibrate micronized (LOFIBRA) 200 MG capsule  03/28/21   [provider]  ?Magnesium 250 MG TABS Take 250 mg by mouth daily.     [provider]  ?meloxicam (MOBIC) 7.5 MG tablet Take by mouth. 03/20/21   [provider]  ?metoprolol succinate (TOPROL-XL) 25 MG 24 hr tablet Take 1 tablet (25 mg total) by mouth daily. Take with or  immediately following a meal. 04/08/21 04/03/22  Buford Dresser, MD  ?metroNIDAZOLE (FLAGYL) 500 MG tablet Take 1 tablet (500 mg total) by mouth 2 (two) times daily. 06/08/21   Mansouraty, Telford Nab., MD  ?multivitamin-lutein [facility]) CAPS Take 1 capsule by mouth daily.    [provider]  ?omeprazole (PRILOSEC) 40 MG capsule Take 1 capsule (40 mg total) by mouth daily. 01/16/20   Mauri Pole, MD  ?sucralfate (CARAFATE) 1 GM/10ML suspension Take 1 g by mouth 4 (four) times daily. 03/16/20   [provider]  ?tretinoin (RETIN-A) 0.1 % cream  03/10/18   [provider]  ?venlafaxine (EFFEXOR) 75 MG tablet Take 75 mg by mouth daily.    [provider]  ?   ? ?Allergies    ?Penicillins, Erythromycin, and Lodine [etodolac]   ? ?Review of Systems   ?Review of Systems  ?All other systems reviewed and are negative. ? ?Physical Exam ?Updated Vital Signs ?BP (!) 136/103 (BP Location: Left Arm)   Pulse 82   Temp 98.5 ?F (36.9 ?C) (Oral)   Resp 18   Ht 5' 1.5" (1.562 m)   Wt 56.7 kg   LMP 06/18/2011   SpO2 100%   BMI 23.24 kg/m?  ?Physical Exam ?Vitals and nursing note reviewed.  ?Constitutional:   ?   General: She is not in acute distress. ?   Appearance: She is well-developed.  ?HENT:  ?   Head: Normocephalic and atraumatic.  ?Eyes:  ?   Conjunctiva/sclera:  Conjunctivae normal.  ?Pulmonary:  ?   Effort: Pulmonary effort is normal. No respiratory distress.  ?   Breath sounds: No stridor.  ?Abdominal:  ?   General: There is no distension.  ?   Tenderness: There is abdominal tenderness. There is guarding.  ?Skin: ?   General: Skin is warm and dry.  ?Neurological:  ?   Mental Status: She is alert and oriented to person, place, and time.  ?   Cranial Nerves: No cranial nerve deficit.  ?Psychiatric:     ?   Mood and Affect: Mood normal.  ? ? ?ED Results / Procedures / Treatments   ?Labs ?(all labs ordered are listed, but only abnormal results are displayed) ?Labs Reviewed   ?CBC - Abnormal; Notable for the following components:  ?    Result Value  ? WBC 10.8 (*)   ? Platelets 425 (*)   ? All other components within normal limits  ?LIPASE, BLOOD  ?COMPREHENSIVE METABOLIC PANEL  ?URINALYSIS, ROUTINE W REFLEX MICROSCOPIC  ? ? ?EKG ?None ? ?Radiology ?CT Abdomen Pelvis W Contrast ? ?Result Date: 07/22/2021 ?CLINICAL DATA:  Left lower quadrant pain for a month, off antibiotics for about a month. Blood in stool. Chronic diarrhea. EXAM: CT ABDOMEN AND PELVIS WITH CONTRAST TECHNIQUE: Multidetector CT imaging of the abdomen and pelvis was performed using the standard protocol following bolus administration of intravenous contrast. RADIATION DOSE REDUCTION: This exam was performed according to the departmental dose-optimization program which includes automated exposure control, adjustment of the mA and/or kV according to patient size and/or use of iterative reconstruction technique. CONTRAST:  148m OMNIPAQUE IOHEXOL 300 MG/ML  SOLN COMPARISON:  None Available. FINDINGS: Lower chest: Minimal subsegmental atelectasis or scarring in the lower lobes. Heart is enlarged. No pericardial or pleural effusion. Large hiatal hernia, incompletely imaged. Hepatobiliary: Liver is decreased in attenuation diffusely and is enlarged, measuring 21.2 cm. Subcentimeter low-attenuation lesions in the liver are too small to characterize but appear similar and are likely benign cysts. Gallbladder is unremarkable. No biliary ductal dilatation. Pancreas: Negative. Spleen: Negative. Adrenals/Urinary Tract: Adrenal glands and kidneys are unremarkable. Ureters are decompressed. Bladder is relatively low in volume. Stomach/Bowel: Large hiatal hernia, partially imaged. Stomach, small bowel, appendix and majority of the colon are otherwise unremarkable. There is a thick-walled fluid collection adjacent to the sigmoid colon, measuring 3.7 x 6.0 cm (2/66). Mild surrounding inflammatory stranding and haziness.  Vascular/Lymphatic: Atherosclerotic calcification of the aorta. Scattered lymph nodes are not enlarged by CT size criteria. Reproductive: Uterus is visualized.  No adnexal mass. Other: No free fluid. Mesenteries and peritoneum are otherwise unremarkable. Musculoskeletal: No worrisome lytic or sclerotic lesions. L5-S1 degenerative disc disease. IMPRESSION: 1. Diverticular abscess adjacent to the sigmoid colon. These results will be called to the ordering clinician or representative by the Radiologist Assistant, and communication documented in the PACS or [employer] 2. Steatotic enlarged liver. 3. Large hiatal hernia. 4.  Aortic atherosclerosis (ICD10-I70.0). Electronically Signed   By: MLorin PicketM.D.   On: 07/22/2021 08:28   ? ?Procedures ?Procedures  ? ? ?Medications Ordered in ED ?Medications  ?cefTRIAXone (ROCEPHIN) 2 g in sodium chloride 0.9 % 100 mL IVPB (has no administration in time range)  ?  And  ?metroNIDAZOLE (FLAGYL) IVPB 500 mg (has no administration in time range)  ? ? ?ED Course/ Medical Decision Making/ A&P ?This patient with a Hx of diverticulosis presents to the ED for concern of abdominal pain, bowel movement changes, nausea, this involves an extensive  number of treatment options, and is a complaint that carries with it a high risk of complications and morbidity.   ? ?The differential diagnosis includes diverticulitis, abscess, peritonitis, urinary tract disease ? ? ?Social Determinants of Health: ? ?Age ? ?Additional history obtained: ? ?Additional history and/or information obtained from chart review, discussion with gastroenterology, notable for CT performed earlier today interpreted by me, notable for diverticular abscess ? ? ?After the initial evaluation, orders, including: Labs antibiotics antiemetics fluids were initiated. ? ? ?Patient placed on Cardiac and Pulse-Oximetry Monitors. ?The patient was maintained on a cardiac monitor.  The cardiac monitored showed an rhythm of 80  sinus normal ?The patient was also maintained on pulse oximetry. The readings were typically 100% room air normal ? ? ?On repeat evaluation of the patient stayed the same ? ?Lab Tests: ? ?I personally interpreted labs.  The pertinent re

## 2021-07-22 NOTE — Telephone Encounter (Signed)
Radiology paged Dr. Silverio Decamp and she didn't answer, therefore, Dr. Tarri Glenn was paged d/t her being the doc of the day. Pt was sent to the ER for further evaluation and treatment. Routing this message to care team to make all providers aware. ?

## 2021-07-22 NOTE — ED Triage Notes (Signed)
Patient had a CT scan abdomen today which showed a diverticular abscess. ?

## 2021-07-22 NOTE — Procedures (Signed)
Interventional Radiology Procedure Note ? ?Procedure: CT guided aspiration of pelvic abscess ? ?Indication: Diverticular abscess ? ?Findings: Please refer to procedural dictation for full description. ? ?Complications: None ? ?EBL: < 10 mL ? ?Miachel Roux, MD ?[phone] ? ? ?

## 2021-07-22 NOTE — Telephone Encounter (Signed)
Called patient and she is in the ER, GI inpatient team is following patient ?

## 2021-07-22 NOTE — Consult Note (Addendum)
? ? ? Consultation ? ?Referring Provider: Dr. Vanita Panda    ?Primary Care Physician:  Berkley Harvey, NP ?Primary Gastroenterologist: Dr. Silverio Decamp       ?Reason for Consultation: Diverticular abscess    ?       ? HPI:   ?Gabrielle Gonzalez is a 66 y.o. female with a past medical history of anxiety, depression, hyperlipidemia, arthritis, osteoporosis, GERD, diverticulitis in 2018 and colon polyps, who presented to the ER today under direction from our clinic in regards to finding of diverticular abscess on CT and continued abdominal pain. ?   06/11/21 patient seen in clinic and had developed left mid abdominal pain which radiated to the left lower quadrant and central lower abdomen on Saturday, 06/08/2021.  The pain worsened and patient called our clinic for further advice, she went on a clear liquid diet and was prescribed Cipro 500 twice daily and Flagyl 500 twice daily by Dr. Rush Landmark.  Along with Fluconazole 200 mg 1 p.o. daily x3 due to reported history of thrush.  At that time was following up in her lower abdominal pain is significantly improved.  She was tolerating the Cipro and Flagyl.  That time patient had a CBC and BMP.  She was told to call the office if her pain worsened and at that time recommended CT AP with oral and IV contrast. ?   07/17/2021 patient contacted our clinic and described that she had felt better for a week or 2 after finishing antibiotics for diverticulitis but then it started with pain again.  She was told to have CBC and CMP as well as CT. ?   07/19/2021 CBC with hemoglobin 11.9 (12.5 a month prior) and CMP normal. ?   07/22/2021 CT of the abdomen pelvis with contrast showed diverticular abscess adjacent to the sigmoid colon measuring 3.7 x 6.0 cm with mild surrounding inflammatory stranding and haziness.  Steatotic enlarged liver and large hiatal hernia as well as aortic atherosclerosis.  Patient was advised to go to the ER. ?   Today, the patient is seen with her husband by her bedside who  does assist with history.  She explains that since the beginning of April really she has had trouble off-and-on with left lower quadrant abdominal pain.  She was treated with antibiotics as above and tells me she felt well for couple weeks but still was not having "regular bowel movements", she then went to [place] on vacation and while there had some "bad days" with worsened pain but she tried to just take Tylenol 2-3 a day and work through it.  Since returning home over the past few weeks she has continued with this pain and has "good days and bad days".  Tells me she has never felt completely normal since April.  She has continued to use Tylenol here and there.  Describes that her regular bowel movements are "irregular", but they have not even been her normal recently.   ?   Denies fever, chills, weight loss, blood in her stool, nausea, vomiting or symptoms that awaken her from sleep. ? ?GI history: ?04/20/2020 colonoscopy: 1 less than 1 mm polyp in the proximal descending colon, 118 mm polyp in the ascending colon, diverticulosis in the sigmoid and descending colon and nonbleeding external and internal hemorrhoids.  Repeat recommended in 3 years.  Pathology showed sessile serrated polyps and tubular adenoma. ? ?Past Medical History:  ?Diagnosis Date  ? Anxiety state, unspecified   ? Arthritis   ? Depressive disorder,  not elsewhere classified   ? Diverticulosis   ? GERD (gastroesophageal reflux disease)   ? Headache, chronic daily   ? Hiatal hernia   ? Hyperlipidemia   ? Hyperplastic colon polyp   ? Osteoporosis   ? Panic disorder without agoraphobia   ? Rosacea   ? Urinary incontinence   ? Stress incontinence  ? ? ?Past Surgical History:  ?Procedure Laterality Date  ? BREAST BIOPSY Left 2009  ? CESAREAN SECTION    ? COLONOSCOPY  2019  ? FOOT SURGERY    ? ORIF HUMERUS FRACTURE Right 09/04/2016  ? Procedure: OPEN REDUCTION INTERNAL FIXATION (ORIF) PROXIMAL HUMERUS FRACTURE;  Surgeon: Justice Britain, MD;  Location: [facility];  Service: Orthopedics;  Laterality: Right;  ? ? ?Family History  ?Problem Relation Age of Onset  ? Prostate cancer Father   ? Diabetes Father   ? Colon cancer Father   ? Liver cancer Father   ?     mets from colon  ? Hypertension Mother   ? Other Mother   ?     Carcinoid tumor of intestine  ? Colon cancer Mother   ? Hypertension Sister   ? Breast cancer Neg Hx   ? Colon polyps Neg Hx   ? Esophageal cancer Neg Hx   ? Rectal cancer Neg Hx   ? Stomach cancer Neg Hx   ? ? ?Social History  ? ?Tobacco Use  ? Smoking status: Never  ? Smokeless tobacco: Never  ?Vaping Use  ? Vaping Use: Never used  ?Substance Use Topics  ? Alcohol use: Yes  ?  Comment: very rare  ? Drug use: No  ? ? ?Prior to Admission medications   ?Medication Sig Start Date End Date Taking? Authorizing Provider  ?B Complex Vitamins (VITAMIN B COMPLEX PO) Take 1 capsule by mouth daily.    [provider]  ?Cholecalciferol (VITAMIN D3) 2000 units capsule Take 2,000 Units by mouth daily.    [provider]  ?ciprofloxacin (CIPRO) 500 MG tablet Take 1 tablet (500 mg total) by mouth 2 (two) times daily. 06/08/21   Mansouraty, Telford Nab., MD  ?diclofenac Sodium (VOLTAREN) 1 % GEL  03/20/21   [provider]  ?estradiol (ESTRACE) 0.1 MG/GM vaginal cream  02/15/20   [provider]  ?fenofibrate micronized (LOFIBRA) 200 MG capsule  03/28/21   [provider]  ?Magnesium 250 MG TABS Take 250 mg by mouth daily.     [provider]  ?meloxicam (MOBIC) 7.5 MG tablet Take by mouth. 03/20/21   [provider]  ?metoprolol succinate (TOPROL-XL) 25 MG 24 hr tablet Take 1 tablet (25 mg total) by mouth daily. Take with or immediately following a meal. 04/08/21 04/03/22  Buford Dresser, MD  ?metroNIDAZOLE (FLAGYL) 500 MG tablet Take 1 tablet (500 mg total) by mouth 2 (two) times daily. 06/08/21   Mansouraty, Telford Nab., MD  ?multivitamin-lutein [facility]) CAPS Take 1 capsule by mouth daily.     [provider]  ?omeprazole (PRILOSEC) 40 MG capsule Take 1 capsule (40 mg total) by mouth daily. 01/16/20   Mauri Pole, MD  ?sucralfate (CARAFATE) 1 GM/10ML suspension Take 1 g by mouth 4 (four) times daily. 03/16/20   [provider]  ?tretinoin (RETIN-A) 0.1 % cream  03/10/18   [provider]  ?venlafaxine (EFFEXOR) 75 MG tablet Take 75 mg by mouth daily.    [provider]  ? ? ?No current facility-administered medications for this encounter.  ? ?  Current Outpatient Medications  ?Medication Sig Dispense Refill  ? B Complex Vitamins (VITAMIN B COMPLEX PO) Take 1 capsule by mouth daily.    ? Cholecalciferol (VITAMIN D3) 2000 units capsule Take 2,000 Units by mouth daily.    ? ciprofloxacin (CIPRO) 500 MG tablet Take 1 tablet (500 mg total) by mouth 2 (two) times daily. 20 tablet 0  ? diclofenac Sodium (VOLTAREN) 1 % GEL     ? estradiol (ESTRACE) 0.1 MG/GM vaginal cream     ? fenofibrate micronized (LOFIBRA) 200 MG capsule     ? Magnesium 250 MG TABS Take 250 mg by mouth daily.     ? meloxicam (MOBIC) 7.5 MG tablet Take by mouth.    ? metoprolol succinate (TOPROL-XL) 25 MG 24 hr tablet Take 1 tablet (25 mg total) by mouth daily. Take with or immediately following a meal. 90 tablet 3  ? metroNIDAZOLE (FLAGYL) 500 MG tablet Take 1 tablet (500 mg total) by mouth 2 (two) times daily. 20 tablet 0  ? multivitamin-lutein (OCUVITE-LUTEIN) CAPS Take 1 capsule by mouth daily.    ? omeprazole (PRILOSEC) 40 MG capsule Take 1 capsule (40 mg total) by mouth daily. 30 capsule 11  ? sucralfate (CARAFATE) 1 GM/10ML suspension Take 1 g by mouth 4 (four) times daily.    ? tretinoin (RETIN-A) 0.1 % cream     ? venlafaxine (EFFEXOR) 75 MG tablet Take 75 mg by mouth daily.    ? ?Facility-Administered Medications Ordered in Other Encounters  ?Medication Dose Route Frequency Provider Last Rate Last Admin  ? sodium chloride (PF) 0.9 % injection           ? ? ?Allergies as of 07/22/2021 - Review  Complete 07/22/2021  ?Allergen Reaction Noted  ? Penicillins  08/14/2010  ? Erythromycin Nausea And Vomiting 08/14/2010  ? Lodine [etodolac] Other (See Comments) 08/14/2010  ? ? ? ?Review of Systems:    ?Constitut

## 2021-07-22 NOTE — Consult Note (Signed)
? ?Chief Complaint: ?Patient was seen in consultation today for CT guided aspiration of left lower abdominal abscess ? ?Referring Physician(s):Lemmon,J  PA-C ? ? ?Supervising Physician: Mir, Sharen Heck ? ?Patient Status: [facility] - ED ? ?History of Present Illness: ?Gabrielle Gonzalez is a 66 y.o. female with PMH sig for anxiety, depression, arthritis, diverticulosis, GERD, hiatal hernia, hyperlipidemia, osteoporosis, panic disorder who presents to [facility], ED now with persistent left lower quadrant abdominal pain despite antibiotic treatment for presumed diverticulitis.  CT abdomen pelvis performed today revealed diverticular abscess adjacent to sigmoid colon, enlarged fatty liver, large hiatal hernia and aortic atherosclerosis.  She is currently afebrile, WBC 10.8, creat nl.  Last colonoscopy was in 2022 with colon polyps-tubular adenoma and diverticulosis.  Request now received from GI team for image guided aspiration/drainage of abscess. ? ?Past Medical History:  ?Diagnosis Date  ? Anxiety state, unspecified   ? Arthritis   ? Depressive disorder, not elsewhere classified   ? Diverticulosis   ? GERD (gastroesophageal reflux disease)   ? Headache, chronic daily   ? Hiatal hernia   ? Hyperlipidemia   ? Hyperplastic colon polyp   ? Osteoporosis   ? Panic disorder without agoraphobia   ? Rosacea   ? Urinary incontinence   ? Stress incontinence  ? ? ?Past Surgical History:  ?Procedure Laterality Date  ? BREAST BIOPSY Left 2009  ? CESAREAN SECTION    ? COLONOSCOPY  2019  ? FOOT SURGERY    ? ORIF HUMERUS FRACTURE Right 09/04/2016  ? Procedure: OPEN REDUCTION INTERNAL FIXATION (ORIF) PROXIMAL HUMERUS FRACTURE;  Surgeon: Justice Britain, MD;  Location: [place];  Service: Orthopedics;  Laterality: Right;  ? ? ?Allergies: ?Penicillins, Erythromycin, and Lodine [etodolac] ? ?Medications: ?Prior to Admission medications   ?Medication Sig Start Date End Date Taking? Authorizing Provider  ?B Complex Vitamins (VITAMIN B COMPLEX PO) Take  1 capsule by mouth daily.    [provider]  ?Cholecalciferol (VITAMIN D3) 2000 units capsule Take 2,000 Units by mouth daily.    [provider]  ?ciprofloxacin (CIPRO) 500 MG tablet Take 1 tablet (500 mg total) by mouth 2 (two) times daily. 06/08/21   Mansouraty, Telford Nab., MD  ?diclofenac Sodium (VOLTAREN) 1 % GEL  03/20/21   [provider]  ?estradiol (ESTRACE) 0.1 MG/GM vaginal cream  02/15/20   [provider]  ?fenofibrate micronized (LOFIBRA) 200 MG capsule  03/28/21   [provider]  ?Magnesium 250 MG TABS Take 250 mg by mouth daily.     [provider]  ?meloxicam (MOBIC) 7.5 MG tablet Take by mouth. 03/20/21   [provider]  ?metoprolol succinate (TOPROL-XL) 25 MG 24 hr tablet Take 1 tablet (25 mg total) by mouth daily. Take with or immediately following a meal. 04/08/21 04/03/22  Buford Dresser, MD  ?metroNIDAZOLE (FLAGYL) 500 MG tablet Take 1 tablet (500 mg total) by mouth 2 (two) times daily. 06/08/21   Mansouraty, Telford Nab., MD  ?multivitamin-lutein [facility]) CAPS Take 1 capsule by mouth daily.    [provider]  ?omeprazole (PRILOSEC) 40 MG capsule Take 1 capsule (40 mg total) by mouth daily. 01/16/20   Mauri Pole, MD  ?sucralfate (CARAFATE) 1 GM/10ML suspension Take 1 g by mouth 4 (four) times daily. 03/16/20   [provider]  ?tretinoin (RETIN-A) 0.1 % cream  03/10/18   [provider]  ?venlafaxine (EFFEXOR) 75 MG tablet Take 75 mg by mouth daily.    [provider]  ?  ? ?  Family History  ?Problem Relation Age of Onset  ? Prostate cancer Father   ? Diabetes Father   ? Colon cancer Father   ? Liver cancer Father   ?     mets from colon  ? Hypertension Mother   ? Other Mother   ?     Carcinoid tumor of intestine  ? Colon cancer Mother   ? Hypertension Sister   ? Breast cancer Neg Hx   ? Colon polyps Neg Hx   ? Esophageal cancer Neg Hx   ? Rectal cancer Neg Hx   ? Stomach cancer Neg  Hx   ? ? ?Social History  ? ?Socioeconomic History  ? Marital status: Married  ?  Spouse name: Not on file  ? Number of children: 2  ? Years of education: Not on file  ? Highest education level: Not on file  ?Occupational History  ? Occupation: Freight forwarder  ?  Employer: [employer]  ?Tobacco Use  ? Smoking status: Never  ? Smokeless tobacco: Never  ?Vaping Use  ? Vaping Use: Never used  ?Substance and Sexual Activity  ? Alcohol use: Yes  ?  Comment: very rare  ? Drug use: No  ? Sexual activity: Yes  ?  Birth control/protection: Condom  ?Other Topics Concern  ? Not on file  ?Social History Narrative  ? Not on file  ? ?Social Determinants of Health  ? ?Financial Resource Strain: Not on file  ?Food Insecurity: Not on file  ?Transportation Needs: Not on file  ?Physical Activity: Not on file  ?Stress: Not on file  ?Social Connections: Not on file  ? ? ? ? ?Review of Systems currently denies fever, headache, chest pain, dyspnea, cough, back pain, nausea, vomiting.  She does have some left lower quadrant discomfort and loose stools. ? ?Vital Signs: ?BP 135/89   Pulse 70   Temp 98.5 ?F (36.9 ?C) (Oral)   Resp 16   Ht 5' 1.5" (1.562 m)   Wt 125 lb (56.7 kg)   LMP 06/18/2011   SpO2 94%   BMI 23.24 kg/m?  ? ?PE: awake, alert.  Chest clear to auscultation bilaterally.  Heart with regular rate and rhythm.  Abdomen soft, positive bowel sounds, mildly tender left lower quadrant to palpation.  No lower extremity edema. ? ?Imaging: ?CT Abdomen Pelvis W Contrast ? ?Result Date: 07/22/2021 ?CLINICAL DATA:  Left lower quadrant pain for a month, off antibiotics for about a month. Blood in stool. Chronic diarrhea. EXAM: CT ABDOMEN AND PELVIS WITH CONTRAST TECHNIQUE: Multidetector CT imaging of the abdomen and pelvis was performed using the standard protocol following bolus administration of intravenous contrast. RADIATION DOSE REDUCTION: This exam was performed according to the departmental dose-optimization program which  includes automated exposure control, adjustment of the mA and/or kV according to patient size and/or use of iterative reconstruction technique. CONTRAST:  127m OMNIPAQUE IOHEXOL 300 MG/ML  SOLN COMPARISON:  None Available. FINDINGS: Lower chest: Minimal subsegmental atelectasis or scarring in the lower lobes. Heart is enlarged. No pericardial or pleural effusion. Large hiatal hernia, incompletely imaged. Hepatobiliary: Liver is decreased in attenuation diffusely and is enlarged, measuring 21.2 cm. Subcentimeter low-attenuation lesions in the liver are too small to characterize but appear similar and are likely benign cysts. Gallbladder is unremarkable. No biliary ductal dilatation. Pancreas: Negative. Spleen: Negative. Adrenals/Urinary Tract: Adrenal glands and kidneys are unremarkable. Ureters are decompressed. Bladder is relatively low in volume. Stomach/Bowel: Large hiatal hernia, partially imaged. Stomach, small bowel, appendix and  majority of the colon are otherwise unremarkable. There is a thick-walled fluid collection adjacent to the sigmoid colon, measuring 3.7 x 6.0 cm (2/66). Mild surrounding inflammatory stranding and haziness. Vascular/Lymphatic: Atherosclerotic calcification of the aorta. Scattered lymph nodes are not enlarged by CT size criteria. Reproductive: Uterus is visualized.  No adnexal mass. Other: No free fluid. Mesenteries and peritoneum are otherwise unremarkable. Musculoskeletal: No worrisome lytic or sclerotic lesions. L5-S1 degenerative disc disease. IMPRESSION: 1. Diverticular abscess adjacent to the sigmoid colon. These results will be called to the ordering clinician or representative by the Radiologist Assistant, and communication documented in the PACS or [employer]. 2. Steatotic enlarged liver. 3. Large hiatal hernia. 4.  Aortic atherosclerosis (ICD10-I70.0). Electronically Signed   By: Lorin Picket M.D.   On: 07/22/2021 08:28   ? ?Labs: ? ?CBC: ?Recent Labs  ?   06/11/21 ?1610 07/19/21 ?9604 07/22/21 ?5409  ?WBC 9.5 10.3 10.8*  ?HGB 12.5 11.9* 12.2  ?HCT 37.3 35.6* 37.5  ?PLT 252.0 348.0 425*  ? ? ?COAGS: ?No results for input(s): INR, APTT in the last 8760 hours. ? ?BMP: ?Rec

## 2021-07-23 ENCOUNTER — Ambulatory Visit: Payer: BC Managed Care – PPO

## 2021-07-23 DIAGNOSIS — K5720 Diverticulitis of large intestine with perforation and abscess without bleeding: Secondary | ICD-10-CM | POA: Diagnosis not present

## 2021-07-23 DIAGNOSIS — I10 Essential (primary) hypertension: Secondary | ICD-10-CM

## 2021-07-23 DIAGNOSIS — K219 Gastro-esophageal reflux disease without esophagitis: Secondary | ICD-10-CM | POA: Diagnosis not present

## 2021-07-23 DIAGNOSIS — F4322 Adjustment disorder with anxiety: Secondary | ICD-10-CM

## 2021-07-23 DIAGNOSIS — E782 Mixed hyperlipidemia: Secondary | ICD-10-CM

## 2021-07-23 LAB — CBC
HCT: 34.9 % — ABNORMAL LOW (ref 36.0–46.0)
Hemoglobin: 11.5 g/dL — ABNORMAL LOW (ref 12.0–15.0)
MCH: 30 pg (ref 26.0–34.0)
MCHC: 33 g/dL (ref 30.0–36.0)
MCV: 91.1 fL (ref 80.0–100.0)
Platelets: 363 10*3/uL (ref 150–400)
RBC: 3.83 MIL/uL — ABNORMAL LOW (ref 3.87–5.11)
RDW: 13 % (ref 11.5–15.5)
WBC: 6.9 10*3/uL (ref 4.0–10.5)
nRBC: 0 % (ref 0.0–0.2)

## 2021-07-23 LAB — HIV ANTIBODY (ROUTINE TESTING W REFLEX): HIV Screen 4th Generation wRfx: NONREACTIVE

## 2021-07-23 LAB — COMPREHENSIVE METABOLIC PANEL
ALT: 14 U/L (ref 0–44)
AST: 15 U/L (ref 15–41)
Albumin: 3.3 g/dL — ABNORMAL LOW (ref 3.5–5.0)
Alkaline Phosphatase: 36 U/L — ABNORMAL LOW (ref 38–126)
Anion gap: 8 (ref 5–15)
BUN: 10 mg/dL (ref 8–23)
CO2: 25 mmol/L (ref 22–32)
Calcium: 9 mg/dL (ref 8.9–10.3)
Chloride: 106 mmol/L (ref 98–111)
Creatinine, Ser: 0.87 mg/dL (ref 0.44–1.00)
GFR, Estimated: >60 mL/min (ref >60)
Glucose, Bld: 113 mg/dL — ABNORMAL HIGH (ref 70–99)
Potassium: 4.2 mmol/L (ref 3.5–5.1)
Sodium: 139 mmol/L (ref 135–145)
Total Bilirubin: 0.5 mg/dL (ref 0.3–1.2)
Total Protein: 6.9 g/dL (ref 6.5–8.1)

## 2021-07-23 NOTE — Progress Notes (Signed)
[facility] ?Progress Note ? ?   ?Subjective: ?CC:  ?Reports minimal LLQ pain. Tolerating FLD without pain, nausea, or vomiting. Denies fever/chills. +flatus. Small liquid BM this AM.  ? ?Objective: ?Vital signs in last 24 hours: ?Temp:  [98 ?F (36.7 ?C)-98.6 ?F (37 ?C)] 98 ?F (36.7 ?C) (05/16 1937) ?Pulse Rate:  [60-88] 60 (05/16 9024) ?Resp:  [15-20] 20 (05/16 0973) ?BP: (111-147)/(74-103) 119/74 (05/16 5329) ?SpO2:  [94 %-100 %] 97 % (05/16 9242) ?Weight:  [56.7 kg] 56.7 kg (05/15 0916) ?Last BM Date : 07/22/21 ? ?Intake/Output from previous day: ?05/15 0701 - 05/16 0700 ?In: 2013.2 [P.O.:600; I.V.:1213.2; IV Piggyback:200] ?Out: 300 [Urine:300] ?Intake/Output this shift: ?No intake/output data recorded. ? ?PE: ?Gen:  Alert, NAD, pleasant ?Card:  Regular rate and rhythm ?Pulm:  Normal effort ORA ?Abd: Soft, non-tender, mild lower abdominal distention  ?Skin: warm and dry, no rashes  ?Psych: A&Ox3  ? ?Lab Results:  ?Recent Labs  ?  07/22/21 ?6834 07/23/21 ?1962  ?WBC 10.8* 6.9  ?HGB 12.2 11.5*  ?HCT 37.5 34.9*  ?PLT 425* 363  ? ?BMET ?Recent Labs  ?  07/22/21 ?0922 07/23/21 ?0433  ?NA 134* 139  ?K 4.5 4.2  ?CL 101 106  ?CO2 25 25  ?GLUCOSE 113* 113*  ?BUN 16 10  ?CREATININE 0.84 0.87  ?CALCIUM 8.9 9.0  ? ?PT/INR ?No results for input(s): LABPROT, INR in the last 72 hours. ?CMP  ?   ?Component Value Date/Time  ? NA 139 07/23/2021 0433  ? K 4.2 07/23/2021 0433  ? CL 106 07/23/2021 0433  ? CO2 25 07/23/2021 0433  ? GLUCOSE 113 (H) 07/23/2021 0433  ? BUN 10 07/23/2021 0433  ? CREATININE 0.87 07/23/2021 0433  ? CALCIUM 9.0 07/23/2021 0433  ? PROT 6.9 07/23/2021 0433  ? ALBUMIN 3.3 (L) 07/23/2021 0433  ? AST 15 07/23/2021 0433  ? ALT 14 07/23/2021 0433  ? ALKPHOS 36 (L) 07/23/2021 0433  ? BILITOT 0.5 07/23/2021 0433  ? GFRNONAA >60 07/23/2021 0433  ? ?Lipase  ?   ?Component Value Date/Time  ? LIPASE 42 07/22/2021 0922  ? ? ? ? ? ?Studies/Results: ?CT Abdomen Pelvis W Contrast ? ?Result Date:  07/22/2021 ?CLINICAL DATA:  Left lower quadrant pain for a month, off antibiotics for about a month. Blood in stool. Chronic diarrhea. EXAM: CT ABDOMEN AND PELVIS WITH CONTRAST TECHNIQUE: Multidetector CT imaging of the abdomen and pelvis was performed using the standard protocol following bolus administration of intravenous contrast. RADIATION DOSE REDUCTION: This exam was performed according to the departmental dose-optimization program which includes automated exposure control, adjustment of the mA and/or kV according to patient size and/or use of iterative reconstruction technique. CONTRAST:  164m OMNIPAQUE IOHEXOL 300 MG/ML  SOLN COMPARISON:  None Available. FINDINGS: Lower chest: Minimal subsegmental atelectasis or scarring in the lower lobes. Heart is enlarged. No pericardial or pleural effusion. Large hiatal hernia, incompletely imaged. Hepatobiliary: Liver is decreased in attenuation diffusely and is enlarged, measuring 21.2 cm. Subcentimeter low-attenuation lesions in the liver are too small to characterize but appear similar and are likely benign cysts. Gallbladder is unremarkable. No biliary ductal dilatation. Pancreas: Negative. Spleen: Negative. Adrenals/Urinary Tract: Adrenal glands and kidneys are unremarkable. Ureters are decompressed. Bladder is relatively low in volume. Stomach/Bowel: Large hiatal hernia, partially imaged. Stomach, small bowel, appendix and majority of the colon are otherwise unremarkable. There is a thick-walled fluid collection adjacent to the sigmoid colon, measuring 3.7 x 6.0 cm (2/66). Mild surrounding inflammatory stranding and haziness.  Vascular/Lymphatic: Atherosclerotic calcification of the aorta. Scattered lymph nodes are not enlarged by CT size criteria. Reproductive: Uterus is visualized.  No adnexal mass. Other: No free fluid. Mesenteries and peritoneum are otherwise unremarkable. Musculoskeletal: No worrisome lytic or sclerotic lesions. L5-S1 degenerative disc  disease. IMPRESSION: 1. Diverticular abscess adjacent to the sigmoid colon. These results will be called to the ordering clinician or representative by the Radiologist Assistant, and communication documented in the PACS or [employer]. 2. Steatotic enlarged liver. 3. Large hiatal hernia. 4.  Aortic atherosclerosis (ICD10-I70.0). Electronically Signed   By: Lorin Picket M.D.   On: 07/22/2021 08:28  ? ?CT ASPIRATION ? ?Result Date: 07/22/2021 ?EXAM: CT-guided aspiration of pericolonic abscess INDICATION: Pericolonic abscess TECHNIQUE: RADIATION DOSE REDUCTION: This exam was performed according to the departmental dose-optimization program which includes automated exposure control, adjustment of the mA and/or kV according to patient size and/or use of iterative reconstruction technique. MEDICATIONS: The patient is currently admitted to the hospital and receiving intravenous antibiotics. The antibiotics were administered within an appropriate time frame prior to the initiation of the procedure. ANESTHESIA/SEDATION: Moderate (conscious) sedation was employed during this procedure. A total of Versed 2 mg and Fentanyl 100 mcg was administered intravenously by the radiology nurse. Total intra-service moderate Sedation Time: 12 minutes. The patient's level of consciousness and vital signs were monitored continuously by radiology nursing throughout the procedure under my direct supervision. COMPLICATIONS: None immediate. PROCEDURE: Informed written consent was obtained from the patient after a thorough discussion of the procedural risks, benefits and alternatives. All questions were addressed. Maximal Sterile Barrier Technique was utilized including caps, mask, sterile gowns, sterile gloves, sterile drape, hand hygiene and skin antiseptic. A timeout was performed prior to the initiation of the procedure. Patient positioned prone on the procedure table. The left gluteal soft tissues were prepped and draped in usual  fashion. Following local lidocaine stray shin, 19 gauge Yueh needle was advanced into the pelvic abscess utilizing CT guidance. 15 mL of purulent material was aspirated and sent for Gram stain and culture. IMPRESSION: 1. 15 mL apparently material aspirated from pelvic abscess. 2. Drain was not placed at this time because of the appearance of this collection on CT. The collection was somewhat mass like with thick soft tissue rind which raises suspicion for possible malignancy. Colonoscopy should be performed when patient condition allows. Electronically Signed   By: Miachel Roux M.D.   On: 07/22/2021 13:48   ? ?Anti-infectives: ?Anti-infectives (From admission, onward)  ? ? Start     Dose/Rate Route Frequency Ordered Stop  ? 07/23/21 1000  cefTRIAXone (ROCEPHIN) 2 g in sodium chloride 0.9 % 100 mL IVPB       ? 2 g ?200 mL/hr over 30 Minutes Intravenous Every 24 hours 07/22/21 1315    ? 07/22/21 2200  metroNIDAZOLE (FLAGYL) IVPB 500 mg       ? 500 mg ?100 mL/hr over 60 Minutes Intravenous Every 12 hours 07/22/21 1315    ? 07/22/21 1015  cefTRIAXone (ROCEPHIN) 2 g in sodium chloride 0.9 % 100 mL IVPB       ?See Hyperspace for full Linked Orders Report.  ? 2 g ?200 mL/hr over 30 Minutes Intravenous  Once 07/22/21 1010 07/22/21 1940  ? 07/22/21 1015  metroNIDAZOLE (FLAGYL) IVPB 500 mg       ?See Hyperspace for full Linked Orders Report.  ? 500 mg ?100 mL/hr over 60 Minutes Intravenous  Once 07/22/21 1010 07/22/21 2010  ? ?  ? ? ? ?  Assessment/Plan ? Diverticular abscess of the sigmoid colon ?- afebrile, VSS, WBC 6.9 ?- s/p IR  aspiration of collection >>purulence. GS w/ GPC, cx pending ?- IV abx ?- advance to SOFT diet ?- no emergent surgical needs. Will need repeat colonoscopy in 6 weeks or so to confirm this was all related to diverticulitis and not a new colonic mass. I will arrange outpatient follow up with one of our colorectal specialists to discuss elective partial colectomy. ?  ?  ?FEN - SOFT ?VTE - SCD's, ok for  chemical VTE from [facility] perspective ?ID - Rocephin/Flagyl ?Admit - [facility] service ?  ?  ?HTN ?GERD ?Anxiety ?HLD  ? LOS: 1 day  ? ?I reviewed nursing notes, Consultant GI notes, hospitalist notes, last 24 h vitals and pain scores,

## 2021-07-23 NOTE — Progress Notes (Signed)
?[facility] Progress Note ? ?Patient: Gabrielle Gonzalez     ?PFX:[id number]  ?DOA: 07/22/2021   ?PCP: Berkley Harvey, NP  ? ?  ?  ?Brief hospital course: ?66 year old female with hypertension, GERD, hyper lipidemia and anxiety presents to the hospital for abdominal pain. ?She had diverticulitis about a month ago.  Continues to have abdominal pain since then.  No complaint of nausea vomiting or diarrhea. ? ?In the ED, she is found to have a diverticular abscess and IR consulted to see if it can be drained.  Per IR notes, it seems that this possible fluid collection has more of a masslike appearance. 15 cc aspirated and drain not placed. IR is asking for general surgery eval ?She is currently receiving ceftriaxone and Flagyl ? ?Subjective:  ?Pain is better. Mild nausea today.  ? ?Assessment and Plan: ?Principal Problem: ?  Colonic diverticular abscess ?- gram stain> WBC, gram + cocci in chains and pairs ?- f/u on final results ?- cont Ceftriaxone and Flagyl ?- advanced to soft diet ?- general surgery plans on repeat colonoscopy in 6 wks ? ?Active Problems: ? ?Dehydration ?- cont NS @ 75 ? ?  Adjustment disorder with anxiety ?- Effexor ? ?  GERD without esophagitis ?- resume PPI ?  ?  Essential hypertension ?- cont Toprol ?  ?DVT prophylaxis:  SCDs Start: 07/22/21 1316 ? ? ?  Code Status: Full Code  ?Consultants: GI, IR, General surgery ?Level of Care: Level of care: Med-Surg ?Disposition Plan:  ?Status is: Inpatient ?Remains inpatient appropriate because: diverticular abscess  ? ?Objective: ?  ?Vitals:  ? 07/22/21 1719 07/22/21 2143 07/23/21 0139 07/23/21 3299  ?BP: (!) 147/92 111/74 113/85 119/74  ?Pulse: 82 61 62 60  ?Resp: '15 16 18 20  '$ ?Temp: 98 ?F (36.7 ?C) 98.6 ?F (37 ?C) 98 ?F (36.7 ?C) 98 ?F (36.7 ?C)  ?TempSrc: Oral Oral    ?SpO2: 99% 97% 97% 97%  ?Weight:      ?Height:      ? ?Filed Weights  ? 07/22/21 0916  ?Weight: 56.7 kg  ? ?Exam: ?General exam: Appears comfortable  ?HEENT: PERRLA, oral mucosa moist,  no sclera icterus or thrush ?Respiratory system: Clear to auscultation. Respiratory effort normal. ?Cardiovascular system: S1 & S2 heard, regular rate and rhythm ?Gastrointestinal system: Abdomen soft, mildly tender in lower abdomen, nondistended. Normal bowel sounds   ?Central nervous system: Alert and oriented. No focal neurological deficits. ?Extremities: No cyanosis, clubbing or edema ?Skin: No rashes or ulcers ?Psychiatry:  Mood & affect appropriate.   ? ?Imaging and lab data was personally reviewed ? ? ? CBC: ?Recent Labs  ?Lab 07/19/21 ?2426 07/22/21 ?8341 07/23/21 ?0433  ?WBC 10.3 10.8* 6.9  ?HGB 11.9* 12.2 11.5*  ?HCT 35.6* 37.5 34.9*  ?MCV 87.3 90.8 91.1  ?PLT 348.0 425* 363  ? ?Basic Metabolic Panel: ?Recent Labs  ?Lab 07/19/21 ?9622 07/22/21 ?2979 07/23/21 ?0433  ?NA 138 134* 139  ?K 3.8 4.5 4.2  ?CL 104 101 106  ?CO2 '26 25 25  '$ ?GLUCOSE 104* 113* 113*  ?BUN '11 16 10  '$ ?CREATININE 0.86 0.84 0.87  ?CALCIUM 9.4 8.9 9.0  ? ?GFR: ?Estimated Creatinine Clearance: 50.5 mL/min (by C-G formula based on SCr of 0.87 mg/dL). ? ?Scheduled Meds: ? fenofibrate  160 mg Oral Q lunch  ? metoprolol succinate  25 mg Oral Q lunch  ? venlafaxine  75 mg Oral q morning  ? ?Continuous Infusions: ? sodium chloride 75 mL/hr at 07/23/21 0319  ?  cefTRIAXone (ROCEPHIN)  IV    ? metronidazole 500 mg (07/22/21 2108)  ? ? ? LOS: 1 day  ? ?Author: ?Debbe Odea  ?07/23/2021 8:28 AM ?   ?

## 2021-07-24 ENCOUNTER — Other Ambulatory Visit (HOSPITAL_COMMUNITY): Payer: Self-pay

## 2021-07-24 DIAGNOSIS — F4322 Adjustment disorder with anxiety: Secondary | ICD-10-CM | POA: Diagnosis not present

## 2021-07-24 DIAGNOSIS — I10 Essential (primary) hypertension: Secondary | ICD-10-CM | POA: Diagnosis not present

## 2021-07-24 DIAGNOSIS — K219 Gastro-esophageal reflux disease without esophagitis: Secondary | ICD-10-CM | POA: Diagnosis not present

## 2021-07-24 DIAGNOSIS — K5720 Diverticulitis of large intestine with perforation and abscess without bleeding: Secondary | ICD-10-CM | POA: Diagnosis not present

## 2021-07-24 MED ORDER — AMOXICILLIN-POT CLAVULANATE 875-125 MG PO TABS
1.0000 | ORAL_TABLET | Freq: Two times a day (BID) | ORAL | Status: DC
  Filled 2021-07-24: qty 1

## 2021-07-24 MED ORDER — FLUCONAZOLE 150 MG PO TABS
150.0000 mg | ORAL_TABLET | Freq: Every day | ORAL | 0 refills | Status: AC
  Filled 2021-07-24: qty 1, 1d supply, fill #0

## 2021-07-24 MED ORDER — AMOXICILLIN-POT CLAVULANATE 875-125 MG PO TABS
1.0000 | ORAL_TABLET | Freq: Two times a day (BID) | ORAL | 0 refills | Status: AC
Start: 2021-07-24 — End: 2021-08-05
  Filled 2021-07-24: qty 24, 12d supply, fill #0

## 2021-07-24 MED ORDER — AMOXICILLIN-POT CLAVULANATE 875-125 MG PO TABS
1.0000 | ORAL_TABLET | Freq: Two times a day (BID) | ORAL | Status: DC
  Administered 2021-07-24: 1 via ORAL

## 2021-07-24 NOTE — Progress Notes (Signed)
Discharge instructions given to patient and all questions were answered.  

## 2021-07-24 NOTE — Discharge Summary (Signed)
?Physician Discharge Summary ?  ?Patient: Gabrielle Gonzalez MRN: [id number] DOB: [birth year]  ?Admit date:     07/22/2021  ?Discharge date: 07/24/21  ?Discharge Physician: Marylu Lund  ? ?PCP: Berkley Harvey, NP  ? ?Recommendations at discharge:  ? ? Follow up with PCP in 1-2 weeks ?Follow up with GI as scheduled ?Per GI, consider a f/u CT in several weeks or so before repeating colonoscopy ? ?Discharge Diagnoses: ?Principal Problem: ?  Colonic diverticular abscess ?Active Problems: ?  Adjustment disorder with anxiety ?  GERD without esophagitis ?  Hyperlipidemia ?  Essential hypertension ? ?Resolved Problems: ?  * No resolved hospital problems. * ? ?Hospital Course: ?66 year old female with hypertension, GERD, hyper lipidemia and anxiety presents to the hospital for abdominal pain. ?She had diverticulitis about a month ago.  Continues to have abdominal pain since then.  No complaint of nausea vomiting or diarrhea. ?  ?In the ED, she is found to have a diverticular abscess and IR consulted to see if it can be drained.  Per IR notes, it seems that this possible fluid collection has more of a masslike appearance. 15 cc aspirated and drain not placed ? ?Assessment and Plan: ?No notes have been filed under this hospital service. ?Service: Hospitalist ?Principal Problem: ?  Colonic diverticular abscess ?- gram stain> WBC, gram + cocci in chains and pairs ?- Had been continued on Ceftriaxone and Flagyl ?- advanced to soft diet ?- general surgery plans on repeat colonoscopy in 6 wks ?- Question of possible childhood penicillin allergy. Given trial of augmentin with no evidence of allergic reaction ?- Plan to discharge on augmentin to complete 14 days of abx ?- Patient to follow up with GI as scheduled  ?  ?Active Problems: ?  ?Dehydration ?- cont NS @ 77 while in hospital ?  ?  Adjustment disorder with anxiety ?- Effexor ?  ?  GERD without esophagitis ?- resume PPI ?  ?  Essential hypertension ?- cont Toprol ?  ?   ? ? ?Consultants: General Surgery, GI ?Procedures performed: CT guided aspiration of pelvic abscess  ?Disposition: Home ?Diet recommendation:  ?Regular diet ?DISCHARGE MEDICATION: ?Allergies as of 07/24/2021   ? ?   Reactions  ? Penicillins Other (See Comments)  ? Unknown childhood allergic reaction ?> > > OK TO GIVE ANCEF WHEN ORDERED, PER MD < < <   09/04/16  ? Erythromycin Nausea And Vomiting  ? Lodine [etodolac] Other (See Comments)  ? GI burning   ? ?  ? ?  ?Medication List  ?  ? ?TAKE these medications   ? ?acetaminophen 500 MG tablet ?Commonly known as: TYLENOL ?Take 1,000 mg by mouth every 6 (six) hours as needed (pain). ?  ?amoxicillin-clavulanate 875-125 MG tablet ?Commonly known as: AUGMENTIN ?Take 1 tablet by mouth every 12 (twelve) hours for 12 days. ?  ?CALCIUM PO ?Take 1 tablet by mouth daily with lunch. Algae based calcium ?  ?fenofibrate micronized 200 MG capsule ?Commonly known as: LOFIBRA ?200 mg daily with lunch. ?  ?fluconazole 150 MG tablet ?Commonly known as: Diflucan ?Take 1 tablet (150 mg total) by mouth for 1 dose as needed for yeast infection ?  ?Magnesium 250 MG Tabs ?Take 250 mg by mouth every morning. ?  ?metoprolol succinate 25 MG 24 hr tablet ?Commonly known as: TOPROL-XL ?Take 1 tablet (25 mg total) by mouth daily. Take with or immediately following a meal. ?What changed: when to take this ?  ?multivitamin-lutein Caps capsule ?Take  1 capsule by mouth every morning. ?  ?omeprazole 40 MG capsule ?Commonly known as: PRILOSEC ?Take 1 capsule (40 mg total) by mouth daily. ?What changed:  ?when to take this ?reasons to take this ?  ?tretinoin 0.1 % cream ?Commonly known as: RETIN-A ?1 application. daily as needed (rosacea). ?  ?venlafaxine 75 MG tablet ?Commonly known as: EFFEXOR ?Take 75 mg by mouth every morning. ?  ?VITAMIN B COMPLEX PO ?Take 1 capsule by mouth every morning. ?  ?Vitamin D3 50 MCG (2000 UT) capsule ?Take 2,000 Units by mouth every morning. ?  ? ?  ? ? Follow-up  Information   ? ? Leighton Ruff, MD. Daphane Shepherd on 2/44/0102.   ?Specialties: General Surgery, Colon and Rectal Surgery ?Why: at 9:10 AM to discuss surgery for diverticulitis. pelase arrive 30 minutes early to get checked in and fill out any necessary paperwork. ?Contact information: ?[address] ?[address] ?[place] [address] ?[phone] ? ? ?  ?  ? ?  ?  ? ?  ? ?Discharge Exam: ?Filed Weights  ? 07/22/21 0916  ?Weight: 56.7 kg  ? ?General exam: Awake, laying in bed, in nad ?Respiratory system: Normal respiratory effort, no wheezing ?Cardiovascular system: regular rate, s1, s2 ?Gastrointestinal system: Soft, nondistended, positive BS ?Central nervous system: CN2-12 grossly intact, strength intact ?Extremities: Perfused, no clubbing ?Skin: Normal skin turgor, no notable skin lesions seen ?Psychiatry: Mood normal // no visual hallucinations  ? ?Condition at discharge: fair ? ?The results of significant diagnostics from this hospitalization (including imaging, microbiology, ancillary and laboratory) are listed below for reference.  ? ?Imaging Studies: ?CT Abdomen Pelvis W Contrast ? ?Result Date: 07/22/2021 ?CLINICAL DATA:  Left lower quadrant pain for a month, off antibiotics for about a month. Blood in stool. Chronic diarrhea. EXAM: CT ABDOMEN AND PELVIS WITH CONTRAST TECHNIQUE: Multidetector CT imaging of the abdomen and pelvis was performed using the standard protocol following bolus administration of intravenous contrast. RADIATION DOSE REDUCTION: This exam was performed according to the departmental dose-optimization program which includes automated exposure control, adjustment of the mA and/or kV according to patient size and/or use of iterative reconstruction technique. CONTRAST:  143m OMNIPAQUE IOHEXOL 300 MG/ML  SOLN COMPARISON:  None Available. FINDINGS: Lower chest: Minimal subsegmental atelectasis or scarring in the lower lobes. Heart is enlarged. No pericardial or pleural effusion. Large hiatal hernia,  incompletely imaged. Hepatobiliary: Liver is decreased in attenuation diffusely and is enlarged, measuring 21.2 cm. Subcentimeter low-attenuation lesions in the liver are too small to characterize but appear similar and are likely benign cysts. Gallbladder is unremarkable. No biliary ductal dilatation. Pancreas: Negative. Spleen: Negative. Adrenals/Urinary Tract: Adrenal glands and kidneys are unremarkable. Ureters are decompressed. Bladder is relatively low in volume. Stomach/Bowel: Large hiatal hernia, partially imaged. Stomach, small bowel, appendix and majority of the colon are otherwise unremarkable. There is a thick-walled fluid collection adjacent to the sigmoid colon, measuring 3.7 x 6.0 cm (2/66). Mild surrounding inflammatory stranding and haziness. Vascular/Lymphatic: Atherosclerotic calcification of the aorta. Scattered lymph nodes are not enlarged by CT size criteria. Reproductive: Uterus is visualized.  No adnexal mass. Other: No free fluid. Mesenteries and peritoneum are otherwise unremarkable. Musculoskeletal: No worrisome lytic or sclerotic lesions. L5-S1 degenerative disc disease. IMPRESSION: 1. Diverticular abscess adjacent to the sigmoid colon. These results will be called to the ordering clinician or representative by the Radiologist Assistant, and communication documented in the PACS or [employer] 2. Steatotic enlarged liver. 3. Large hiatal hernia. 4.  Aortic atherosclerosis (ICD10-I70.0).  Electronically Signed   By: Lorin Picket M.D.   On: 07/22/2021 08:28  ? ?CT ASPIRATION ? ?Result Date: 07/22/2021 ?EXAM: CT-guided aspiration of pericolonic abscess INDICATION: Pericolonic abscess TECHNIQUE: RADIATION DOSE REDUCTION: This exam was performed according to the departmental dose-optimization program which includes automated exposure control, adjustment of the mA and/or kV according to patient size and/or use of iterative reconstruction technique. MEDICATIONS: The patient is currently  admitted to the hospital and receiving intravenous antibiotics. The antibiotics were administered within an appropriate time frame prior to the initiation of the procedure. ANESTHESIA/SEDATION: Moderate (conscious) sedation was

## 2021-07-24 NOTE — Progress Notes (Signed)
[facility] ?Progress Note ? ?   ?Subjective: ?CC:  ?Reports ongoing mild LLQ discomfort after eating/drinking, lasts for a very short time then goes away. Denies fever, chills, nausea, vomiting. Having loose, non-bloody stools.  ? ?Objective: ?Vital signs in last 24 hours: ?Temp:  [98 ?F (36.7 ?C)-98.6 ?F (37 ?C)] 98.5 ?F (36.9 ?C) (05/17 0500) ?Pulse Rate:  [56-65] 56 (05/17 0500) ?Resp:  [16] 16 (05/17 0500) ?BP: (130-147)/(79-91) 130/84 (05/17 0500) ?SpO2:  [97 %-98 %] 97 % (05/17 0500) ?Last BM Date : 07/23/21 ? ?Intake/Output from previous day: ?05/16 0701 - 05/17 0700 ?In: 2952.8 [P.O.:1080; I.V.:1572.8; IV Piggyback:300] ?Out: 4900 [Urine:4900] ?Intake/Output this shift: ?No intake/output data recorded. ? ?PE: ?Gen:  Alert, NAD, pleasant ?Card:  Regular rate and rhythm ?Pulm:  Normal effort ORA ?Abd: Soft, non-tender, mild lower abdominal distention  ?Skin: warm and dry, no rashes  ?Psych: A&Ox3  ? ?Lab Results:  ?Recent Labs  ?  07/22/21 ?7425 07/23/21 ?9563  ?WBC 10.8* 6.9  ?HGB 12.2 11.5*  ?HCT 37.5 34.9*  ?PLT 425* 363  ? ?BMET ?Recent Labs  ?  07/22/21 ?0922 07/23/21 ?0433  ?NA 134* 139  ?K 4.5 4.2  ?CL 101 106  ?CO2 25 25  ?GLUCOSE 113* 113*  ?BUN 16 10  ?CREATININE 0.84 0.87  ?CALCIUM 8.9 9.0  ? ?PT/INR ?No results for input(s): LABPROT, INR in the last 72 hours. ?CMP  ?   ?Component Value Date/Time  ? NA 139 07/23/2021 0433  ? K 4.2 07/23/2021 0433  ? CL 106 07/23/2021 0433  ? CO2 25 07/23/2021 0433  ? GLUCOSE 113 (H) 07/23/2021 0433  ? BUN 10 07/23/2021 0433  ? CREATININE 0.87 07/23/2021 0433  ? CALCIUM 9.0 07/23/2021 0433  ? PROT 6.9 07/23/2021 0433  ? ALBUMIN 3.3 (L) 07/23/2021 0433  ? AST 15 07/23/2021 0433  ? ALT 14 07/23/2021 0433  ? ALKPHOS 36 (L) 07/23/2021 0433  ? BILITOT 0.5 07/23/2021 0433  ? GFRNONAA >60 07/23/2021 0433  ? ?Lipase  ?   ?Component Value Date/Time  ? LIPASE 42 07/22/2021 0922  ? ? ? ? ? ?Studies/Results: ?CT ASPIRATION ? ?Result Date: 07/22/2021 ?EXAM: CT-guided  aspiration of pericolonic abscess INDICATION: Pericolonic abscess TECHNIQUE: RADIATION DOSE REDUCTION: This exam was performed according to the departmental dose-optimization program which includes automated exposure control, adjustment of the mA and/or kV according to patient size and/or use of iterative reconstruction technique. MEDICATIONS: The patient is currently admitted to the hospital and receiving intravenous antibiotics. The antibiotics were administered within an appropriate time frame prior to the initiation of the procedure. ANESTHESIA/SEDATION: Moderate (conscious) sedation was employed during this procedure. A total of Versed 2 mg and Fentanyl 100 mcg was administered intravenously by the radiology nurse. Total intra-service moderate Sedation Time: 12 minutes. The patient's level of consciousness and vital signs were monitored continuously by radiology nursing throughout the procedure under my direct supervision. COMPLICATIONS: None immediate. PROCEDURE: Informed written consent was obtained from the patient after a thorough discussion of the procedural risks, benefits and alternatives. All questions were addressed. Maximal Sterile Barrier Technique was utilized including caps, mask, sterile gowns, sterile gloves, sterile drape, hand hygiene and skin antiseptic. A timeout was performed prior to the initiation of the procedure. Patient positioned prone on the procedure table. The left gluteal soft tissues were prepped and draped in usual fashion. Following local lidocaine stray shin, 19 gauge Yueh needle was advanced into the pelvic abscess utilizing CT guidance. 15 mL of purulent material  was aspirated and sent for Gram stain and culture. IMPRESSION: 1. 15 mL apparently material aspirated from pelvic abscess. 2. Drain was not placed at this time because of the appearance of this collection on CT. The collection was somewhat mass like with thick soft tissue rind which raises suspicion for possible  malignancy. Colonoscopy should be performed when patient condition allows. Electronically Signed   By: Miachel Roux M.D.   On: 07/22/2021 13:48   ? ?Anti-infectives: ?Anti-infectives (From admission, onward)  ? ? Start     Dose/Rate Route Frequency Ordered Stop  ? 07/23/21 1000  cefTRIAXone (ROCEPHIN) 2 g in sodium chloride 0.9 % 100 mL IVPB       ? 2 g ?200 mL/hr over 30 Minutes Intravenous Every 24 hours 07/22/21 1315    ? 07/22/21 2200  metroNIDAZOLE (FLAGYL) IVPB 500 mg       ? 500 mg ?100 mL/hr over 60 Minutes Intravenous Every 12 hours 07/22/21 1315    ? 07/22/21 1015  cefTRIAXone (ROCEPHIN) 2 g in sodium chloride 0.9 % 100 mL IVPB       ?See Hyperspace for full Linked Orders Report.  ? 2 g ?200 mL/hr over 30 Minutes Intravenous  Once 07/22/21 1010 07/22/21 1940  ? 07/22/21 1015  metroNIDAZOLE (FLAGYL) IVPB 500 mg       ?See Hyperspace for full Linked Orders Report.  ? 500 mg ?100 mL/hr over 60 Minutes Intravenous  Once 07/22/21 1010 07/22/21 2010  ? ?  ? ? ? ?Assessment/Plan ? Diverticular abscess of the sigmoid colon ?- afebrile, VSS, WBC 6.9 ?- s/p IR  aspiration of collection >>purulence. GS w/ GPC, cx pending ?- IV abx ?- SOFT diet ?- no emergent surgical needs. Will need repeat colonoscopy in 6 weeks or so to confirm this was all related to diverticulitis and not a new colonic mass. I will arrange outpatient follow up with one of our colorectal specialists to discuss elective partial colectomy. From a [facility] perspective this patient is stable for discharge on PO abx, we will sign off. Please call as needed.  ?  ?  ?  ?  ?HTN ?GERD ?Anxiety ?HLD  ? LOS: 2 days  ? ?I reviewed nursing notes, Consultant GI notes, hospitalist notes, last 24 h vitals and pain scores, last 48 h intake and output, last 24 h labs and trends, and last 24 h imaging results. ? ? ?Obie Dredge, PA-C ?[facility] ?Please see Amion for pager number during day hours 7:00am-4:30pm ? ? ? ? ? ? ?

## 2021-07-24 NOTE — Progress Notes (Signed)
Transition of Care (TOC) Screening Note ? ?Patient Details  ?Name: Gabrielle Gonzalez ?Date of Birth: [birth year] ? ?Transition of Care (TOC) CM/SW Contact:    ?Sherie Don, LCSW ?Phone Number: ?07/24/2021, 11:25 AM ? ?Transition of Care Department [facility]) has reviewed patient and no TOC needs have been identified at this time. We will continue to monitor patient advancement through interdisciplinary progression rounds. If new patient transition needs arise, please place a TOC consult. ?

## 2021-07-24 NOTE — Hospital Course (Signed)
66 year old female with hypertension, GERD, hyper lipidemia and anxiety presents to the hospital for abdominal pain. ?She had diverticulitis about a month ago.  Continues to have abdominal pain since then.  No complaint of nausea vomiting or diarrhea. ?  ?In the ED, she is found to have a diverticular abscess and IR consulted to see if it can be drained.  Per IR notes, it seems that this possible fluid collection has more of a masslike appearance. 15 cc aspirated and drain not placed ?

## 2021-07-25 LAB — CYTOLOGY - NON PAP

## 2021-07-26 ENCOUNTER — Ambulatory Visit (HOSPITAL_COMMUNITY): Payer: BC Managed Care – PPO

## 2021-07-27 LAB — AEROBIC/ANAEROBIC CULTURE W GRAM STAIN (SURGICAL/DEEP WOUND)

## 2021-08-01 ENCOUNTER — Ambulatory Visit: Payer: BC Managed Care – PPO | Admitting: Nurse Practitioner

## 2021-08-06 ENCOUNTER — Ambulatory Visit
Admit: 2021-08-06 | Discharge: 2021-08-06 | Disposition: A | Discharge status: Home / Self Care | Payer: BC Managed Care – PPO | Attending: Nurse Practitioner | Admitting: Nurse Practitioner

## 2021-08-06 DIAGNOSIS — Z1231 Encounter for screening mammogram for malignant neoplasm of breast: Secondary | ICD-10-CM

## 2021-08-13 NOTE — Progress Notes (Unsigned)
08/13/2021 Gabrielle Gonzalez [id number] [birth year]   Chief Complaint:  History of Present Illness: Gabrielle Gonzalez is a 66 year old female with a past medical history of anxiety, depression, hyperlipidemia, arthritis, osteoporosis, GERD, diverticulitis 2018 and colon polyps.  She was admitted to the hospital 07/24/2021 with a diverticular abscess. CTAP  showed an abscess adjacent to the sigmoid colon measuring 3.7 x 6.0 cm. S/P aspiration by IR, cultures were positive for streptococcus intermedius. She received IV antibiotics then transitioned to Augmentin '875mg'$  po bid x 14 days at the time of discharge .  Dr. Dwaine Gale of IR was concerned about "rind-like" appearance of the colon raising concern for mass, ? Tumor. Seems unlikely given colonoscopy as recent as 04/2020 and diverticulosis but no mass seen.  She was seen by general surgery who recommended a repeat colonoscopy in 6 weeks to confirm this was all related to diverticulitis and not a new colonic mass. She was to follow up with general surgery to further discuss and elective partial colectomy.      Latest Ref Rng & Units 07/23/2021    4:33 AM 07/22/2021    9:22 AM 07/19/2021    9:22 AM  CBC  WBC 4.0 - 10.5 K/uL 6.9   10.8   10.3    Hemoglobin 12.0 - 15.0 g/dL 11.5   12.2   11.9    Hematocrit 36.0 - 46.0 % 34.9   37.5   35.6    Platelets 150 - 400 K/uL 363   425   348.0       CTAP 05/22/2021:    Latest Ref Rng & Units 07/23/2021    4:33 AM 07/22/2021    9:22 AM 07/19/2021    9:22 AM  CMP  Glucose 70 - 99 mg/dL 113   113   104    BUN 8 - 23 mg/dL '10   16   11    '$ Creatinine 0.44 - 1.00 mg/dL 0.87   0.84   0.86    Sodium 135 - 145 mmol/L 139   134   138    Potassium 3.5 - 5.1 mmol/L 4.2   4.5   3.8    Chloride 98 - 111 mmol/L 106   101   104    CO2 22 - 32 mmol/L '25   25   26    '$ Calcium 8.9 - 10.3 mg/dL 9.0   8.9   9.4    Total Protein 6.5 - 8.1 g/dL 6.9   7.8   7.2    Total Bilirubin 0.3 - 1.2 mg/dL 0.5   1.3   0.5    Alkaline  Phos 38 - 126 U/L 36   39   41    AST 15 - 41 U/L 15   35   15    ALT 0 - 44 U/L '14   16   14      '$ FINDINGS: Lower chest: Minimal subsegmental atelectasis or scarring in the lower lobes. Heart is enlarged. No pericardial or pleural effusion. Large hiatal hernia, incompletely imaged.   Hepatobiliary: Liver is decreased in attenuation diffusely and is enlarged, measuring 21.2 cm. Subcentimeter low-attenuation lesions in the liver are too small to characterize but appear similar and are likely benign cysts. Gallbladder is unremarkable. No biliary ductal dilatation.   Pancreas: Negative.   Spleen: Negative.   Adrenals/Urinary Tract: Adrenal glands and kidneys are unremarkable. Ureters are decompressed. Bladder is relatively low in volume.   Stomach/Bowel:  Large hiatal hernia, partially imaged. Stomach, small bowel, appendix and majority of the colon are otherwise unremarkable. There is a thick-walled fluid collection adjacent to the sigmoid colon, measuring 3.7 x 6.0 cm (2/66). Mild surrounding inflammatory stranding and haziness.   Vascular/Lymphatic: Atherosclerotic calcification of the aorta. Scattered lymph nodes are not enlarged by CT size criteria.   Reproductive: Uterus is visualized.  No adnexal mass.   Other: No free fluid. Mesenteries and peritoneum are otherwise unremarkable.   Musculoskeletal: No worrisome lytic or sclerotic lesions. L5-S1 degenerative disc disease.   IMPRESSION: 1. Diverticular abscess adjacent to the sigmoid colon. These results will be called to the ordering clinician or representative by the Radiologist Assistant, and communication documented in the PACS or [employer]. 2. Steatotic enlarged liver. 3. Large hiatal hernia. 4.  Aortic atherosclerosis  Colonoscopy 04/20/2020: - One less than 1 mm polyp in the proximal descending colon, removed with a cold biopsy forceps. Resected and retrieved. - One 18 mm polyp in the ascending colon,  removed with a cold snare and removed piecemeal using a cold snare. Resected and retrieved. - Diverticulosis in the sigmoid colon and in the descending colon. - Non-bleeding external and internal hemorrhoids. - 3 year colonoscopy recall 1. Surgical [P], colon, ascending, polyp - SESSILE SERRATED POLYP(S) WITHOUT CYTOLOGIC DYSPLASIA 2. Surgical [P], colon, descending, polyp - TUBULAR ADENOMA WITHOUT HIGH-GRADE DYSPLASIA OR MALIGNANCY  Current Medications, Allergies, Past Medical History, Past Surgical History, Family History and Social History were reviewed in [employer] record.   Review of Systems:   Constitutional: Negative for fever, sweats, chills or weight loss.  Respiratory: Negative for shortness of breath.   Cardiovascular: Negative for chest pain, palpitations and leg swelling.  Gastrointestinal: See HPI.  Musculoskeletal: Negative for back pain or muscle aches.  Neurological: Negative for dizziness, headaches or paresthesias.    Physical Exam: LMP 06/18/2011  General: Well developed, w   ***female in no acute distress. Head: Normocephalic and atraumatic. Eyes: No scleral icterus. Conjunctiva pink . Ears: Normal auditory acuity. Mouth: Dentition intact. No ulcers or lesions.  Lungs: Clear throughout to auscultation. Heart: Regular rate and rhythm, no murmur. Abdomen: Soft, nontender and nondistended. No masses or hepatomegaly. Normal bowel sounds x 4 quadrants.  Rectal: *** Musculoskeletal: Symmetrical with no gross deformities. Extremities: No edema. Neurological: Alert oriented x 4. No focal deficits.  Psychological: Alert and cooperative. Normal mood and affect  Assessment and Recommendations:  1) Diverticular abscess   Hepatomegaly

## 2021-08-14 ENCOUNTER — Encounter: Payer: Self-pay | Admitting: Nurse Practitioner

## 2021-08-14 ENCOUNTER — Ambulatory Visit: Payer: BC Managed Care – PPO | Admitting: Nurse Practitioner

## 2021-08-14 ENCOUNTER — Other Ambulatory Visit (INDEPENDENT_AMBULATORY_CARE_PROVIDER_SITE_OTHER): Payer: BC Managed Care – PPO

## 2021-08-14 VITALS — BP 94/74 | HR 65 | Ht 61.5 in | Wt 128.0 lb

## 2021-08-14 DIAGNOSIS — K5720 Diverticulitis of large intestine with perforation and abscess without bleeding: Secondary | ICD-10-CM

## 2021-08-14 LAB — CBC WITH DIFFERENTIAL/PLATELET
Basophils Absolute: 0.1 10*3/uL (ref 0.0–0.1)
Basophils Relative: 1.2 % (ref 0.0–3.0)
Eosinophils Absolute: 0.2 10*3/uL (ref 0.0–0.7)
Eosinophils Relative: 3.3 % (ref 0.0–5.0)
HCT: 35.7 % — ABNORMAL LOW (ref 36.0–46.0)
Hemoglobin: 11.8 g/dL — ABNORMAL LOW (ref 12.0–15.0)
Lymphocytes Relative: 40.1 % (ref 12.0–46.0)
Lymphs Abs: 2.3 10*3/uL (ref 0.7–4.0)
MCHC: 33.2 g/dL (ref 30.0–36.0)
MCV: 87.2 fl (ref 78.0–100.0)
Monocytes Absolute: 0.6 10*3/uL (ref 0.1–1.0)
Monocytes Relative: 11 % (ref 3.0–12.0)
Neutro Abs: 2.6 10*3/uL (ref 1.4–7.7)
Neutrophils Relative %: 44.4 % (ref 43.0–77.0)
Platelets: 269 10*3/uL (ref 150.0–400.0)
RBC: 4.09 Mil/uL (ref 3.87–5.11)
RDW: 13.9 % (ref 11.5–15.5)
WBC: 5.8 10*3/uL (ref 4.0–10.5)

## 2021-08-14 LAB — BASIC METABOLIC PANEL
BUN: 18 mg/dL (ref 6–23)
CO2: 27 mEq/L (ref 19–32)
Calcium: 9.9 mg/dL (ref 8.4–10.5)
Chloride: 104 mEq/L (ref 96–112)
Creatinine, Ser: 0.8 mg/dL (ref 0.40–1.20)
GFR: 77.97 mL/min (ref >60.00)
Glucose, Bld: 90 mg/dL (ref 70–99)
Potassium: 4.1 mEq/L (ref 3.5–5.1)
Sodium: 139 mEq/L (ref 135–145)

## 2021-08-14 LAB — C-REACTIVE PROTEIN: CRP: <1 mg/dL (ref 0.5–20.0)

## 2021-08-14 NOTE — Patient Instructions (Signed)
If you are age 64 or younger, your body mass index should be between 19-25. Your Body mass index is 23.79 kg/m. If this is out of the aformentioned range listed, please consider follow up with your Primary Care Provider.  ________________________________________________________  The [facility] providers would like to encourage you to use [facility] to communicate with providers for non-urgent requests or questions.  Due to long hold times on the telephone, sending your provider a message by [facility] may be a faster and more efficient way to get a response.  Please allow 48 business hours for a response.  Please remember that this is for non-urgent requests.  _______________________________________________________  Your provider has requested that you go to the basement level for lab work before leaving today. Press "B" on the elevator. The lab is located at the first door on the left as you exit the elevator.  You have been scheduled for a CT scan of the abdomen and pelvis at [place]. You are scheduled on __________  at _______. You should arrive 15 minutes prior to your appointment time for registration.   Please follow the written instructions below on the day of your exam:   You may take any medications as prescribed with a small amount of water, if necessary. If you take any of the following medications: METFORMIN, GLUCOPHAGE, GLUCOVANCE, AVANDAMET, RIOMET, FORTAMET, [place] MET, JANUMET, GLUMETZA or METAGLIP, you MAY be asked to HOLD this medication 48 hours AFTER the exam.   The purpose of you drinking the oral contrast is to aid in the visualization of your intestinal tract. The contrast solution may cause some diarrhea. Depending on your individual set of symptoms, you may also receive an intravenous injection of x-ray contrast/dye. Plan on being at [facility] for 45 minutes or longer, depending on the type of exam you are having performed.   You may call [facility] at  [phone] to discuss scheduling your CT scan.  Due to recent changes in healthcare laws, you may see the results of your imaging and laboratory studies on MyChart before your provider has had a chance to review them.  We understand that in some cases there may be results that are confusing or concerning to you. Not all laboratory results come back in the same time frame and the provider may be waiting for multiple results in order to interpret others.  Please give [place] 48 hours in order for your provider to thoroughly review all the results before contacting the office for clarification of your results.   Take Metamucil every morning as tolerated.  Take Miralax every night as tolerated.  Contact our office if abdominal pain recurs.   Thank you for entrusting me with your care and choosing [facility].  Dr Ardis Hughs

## 2021-08-27 ENCOUNTER — Telehealth: Payer: Self-pay | Admitting: Nurse Practitioner

## 2021-08-27 NOTE — Telephone Encounter (Signed)
Gabrielle Gonzalez from [facility] called to advise the patient is scheduled for 09/02/21 but the auth on file expired on 08/16/21. You can reach her at [phone].

## 2021-08-28 NOTE — Telephone Encounter (Addendum)
Left voicemail on Gabrielle Gonzalez's machine that CT scan has been approved: Auth# [id number] valid from 08/28/21 - 09/26/21.  Georges Lynch to return call with any further questions or concerns.

## 2021-09-06 ENCOUNTER — Telehealth: Payer: Self-pay | Admitting: Nurse Practitioner

## 2021-09-06 NOTE — Telephone Encounter (Signed)
Gabrielle Gonzalez can you confirm when she completed the last antibiotic course and if she is having any symptoms.  Plan to do antibiotic course for 14 days with Augmentin 875 mg twice daily if she is not allergic to penicillin. Please check with IR if this abscess is drainable.  She should undergo segmental colon resection to prevent recurrent diverticulitis or complications.  Refer to colorectal surgery, Dr. Leighton Ruff She is up-to-date with surveillance colonoscopies, most recent colonoscopy was in February 2022 if surgery does not require an updated colonoscopy we can hold off doing colonoscopy until her recall in February 2025.  Thank you

## 2021-09-06 NOTE — Telephone Encounter (Signed)
Ok, thank you for following up

## 2021-09-06 NOTE — Telephone Encounter (Signed)
I spoke with interventional radiologist Dr. Dwaine Gale, he stated he needs to review the CTAP films from [facility] and his office will facilitate obtaining these films.  He will contact me after he reviews her CT films.

## 2021-09-06 NOTE — Telephone Encounter (Signed)
I called Ms. Heady at this time, I left a msg on her personal voicemail to call me back to discuss her follow up CT report and to obtain a sx update.

## 2021-09-06 NOTE — Telephone Encounter (Signed)
Dr. Silverio Decamp, refer to office visit 08/14/2021.  Patient with recent hospitalization for diverticular abscess.  Follow-up CTAP was done at [facility] 09/02/2021 see results below which showed possible residual small abscess.  Please let me know if she requires further antibiotics and when would be appropriate time to schedule her for a colonoscopy?   Thank you  FINDINGS:   LOWER THORAX:   Lung bases are clear. Heart size is normal. There is a large hiatal hernia.   ABDOMEN:   Liver:Attenuation is diminished relative to the spleen suggesting steatosis. There is a small nonenhancing lesion in the right lobe on image 30 series 2 which does not enhance on the delayed phase data set consistent with a small cyst although the structure is too small for accurate characterization..  Gallbladder:Normal.  Spleen:Normal  Pancreas:Normal.  Adrenals:Normal.  Kidneys: Normal  Ureters:  Ureters normal in course and caliber.  Bladder: Normal  Reproductive: No evidence of pelvic mass or free fluid.  Vessels: Vessels are normal in caliber.  Lymph nodes:No adenopathy.  GI: Adjacent the mid to distal sigmoid colon is a low-attenuation complex structure measuring 12 x 24 mm image 121 series 2. This may represent residual abscess. There is no gas within the collection and there is little or no adjacent edema. No evidence of bowel obstruction. Appendix is normal.  MSK:No acute or aggressive abnormalities.    IMPRESSION:Possible residual small abscess adjacent the mid to distal colon.

## 2021-09-06 NOTE — Telephone Encounter (Signed)
Dr. Silverio Decamp,    Patient was discharged home from the hospital on 07/24/2021 on Augmentin '875mg'$  po bid x 14 days which she completed. She was seen in office on 08/14/2021, no abdominal pain at that time. No further antibiotics since then end of May.   During her hospital admission, she was seen by general surgery who recommended a repeat colonoscopy in 6 weeks to confirm this was all related to diverticulitis and not a new colonic mass (which would be unlikely with her most recent colonoscopy 04/20/2020 showed one 22m sessile serrated polyp and one 160mtubular adenomatous polyp which were removed from the ascending and proximal descending colon).    She is scheduled to see Dr. AlJuline Patch/5/40/0867  She reports feeling quite well.  No abdominal pain.  Normal bowel movements.  She is eating a regular diet.  I contacted IR, [facility] MiDwaine Galeo verify if the residual abscess is drainable or not.  I will keep you updated.    She was instructed to follow up with general surgery to further discuss a future elective partial colectomy. She as discharged home 07/24/2021 on  Augmentin '875mg'$  po bid x 14 days.

## 2021-10-21 ENCOUNTER — Other Ambulatory Visit (HOSPITAL_BASED_OUTPATIENT_CLINIC_OR_DEPARTMENT_OTHER): Payer: Self-pay | Admitting: *Deleted

## 2021-10-21 ENCOUNTER — Encounter (HOSPITAL_BASED_OUTPATIENT_CLINIC_OR_DEPARTMENT_OTHER): Payer: Self-pay | Admitting: *Deleted

## 2021-10-23 ENCOUNTER — Encounter (HOSPITAL_BASED_OUTPATIENT_CLINIC_OR_DEPARTMENT_OTHER): Payer: Self-pay

## 2021-10-23 DIAGNOSIS — K635 Polyp of colon: Secondary | ICD-10-CM | POA: Insufficient documentation

## 2021-10-23 DIAGNOSIS — M1990 Unspecified osteoarthritis, unspecified site: Secondary | ICD-10-CM | POA: Insufficient documentation

## 2021-10-23 DIAGNOSIS — R519 Headache, unspecified: Secondary | ICD-10-CM | POA: Insufficient documentation

## 2021-10-23 DIAGNOSIS — K5790 Diverticulosis of intestine, part unspecified, without perforation or abscess without bleeding: Secondary | ICD-10-CM | POA: Insufficient documentation

## 2021-10-28 ENCOUNTER — Ambulatory Visit (HOSPITAL_BASED_OUTPATIENT_CLINIC_OR_DEPARTMENT_OTHER): Payer: BC Managed Care – PPO | Admitting: Cardiology

## 2021-10-28 ENCOUNTER — Encounter (HOSPITAL_BASED_OUTPATIENT_CLINIC_OR_DEPARTMENT_OTHER): Payer: Self-pay | Admitting: Cardiology

## 2021-10-28 VITALS — BP 122/70 | HR 75 | Ht 61.5 in | Wt 131.0 lb

## 2021-10-28 DIAGNOSIS — Z01810 Encounter for preprocedural cardiovascular examination: Secondary | ICD-10-CM

## 2021-10-28 DIAGNOSIS — I10 Essential (primary) hypertension: Secondary | ICD-10-CM

## 2021-10-28 DIAGNOSIS — E782 Mixed hyperlipidemia: Secondary | ICD-10-CM

## 2021-10-28 DIAGNOSIS — Q248 Other specified congenital malformations of heart: Secondary | ICD-10-CM | POA: Diagnosis not present

## 2021-10-28 DIAGNOSIS — I517 Cardiomegaly: Secondary | ICD-10-CM

## 2021-10-28 NOTE — Progress Notes (Signed)
Cardiology Office Note:    Date:  10/29/2021   ID:  [name], DOB February 20, 1956, MRN [id number]  PCP:  Berkley Harvey, NP  Cardiologist:  Buford Dresser, MD  Referring MD: Berkley Harvey, NP   CC: Follow-up  History of Present Illness:    Gabrielle Gonzalez is a 66 y.o. female with a hx of hyperlipidemia, GERD, arthritis, and depression, who is seen for follow-up. She was initially seen 04/08/2021 as a new consult at the request of Berkley Harvey, NP for the evaluation and management of newly recognized heart murmur and abnormal echo showing moderate mitral regurgitation.  Notes from Eldridge Abrahams, NP reviewed 03/28/21, annual physical. Echo report reviewed in Care Everywhere, unable to see actual images.  Cardiovascular risk factors: Prior clinical ASCVD: None Comorbid conditions: Typically, her at home blood pressure was always well controlled around 110/70. Only recently has her blood pressure been averaging in the 130s-140s/90s. Family history: Her mother had hypertension. Her older sister has hypertension, and a history of slight heart murmur. Her younger sister was told she had a slight mitral valve prolapse. Prior cardiac testing and/or incidental findings on other testing (ie coronary calcium): TTE 03/13/21 from [place] reviewed at length. While she was in Urgent Care 02/2021 she was first told that she had a heart murmur. Exercise level: Not much since her COVID infection. She has been trying to increase her exercise with cardio classes twice a week. Of note, she endorses exertional shortness of breath, as well as nausea and emesis if she pushes herself too much. She denies any syncopal episodes. Typically she scales back if she begins to feel ill. Occasionally she will perform 8 lb arm curls and 3 lb overhead weight lifting. Current diet: Will work on staying hydrated.  She presented to urgent care for strep throat, told she had a murmur. She saw her PCP 02/14/21, noted to  have 2/6 systolic murmur. Echo ordered, see full report. 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 Gradient measured at 212 but thought not to be accurate. Moderate MR due to [facility].  On 07/22/2021 she was admitted to the hospital after being found to have a colonic diverticular abscess. She underwent CT guided aspiration.  Today: She reports feeling well today.  She has been tolerating metoprolol. During exercise she notes it takes a little more exertion to increase her heart rate. However, she has also noticed that her heart will start pounding a little faster or heavier when carrying something upstairs. If walking upstairs without carrying something she denies any issues.  Lately she has not felt physically limited. She has also been careful not to over exert in the hot weather.  She has been trying to stay hydrated.  She denies any chest pain, shortness of breath, or peripheral edema. No lightheadedness, headaches, syncope, orthopnea, or PND.  Of note, in early November she will undergo colonic resection.   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   B Complex Vitamins (VITAMIN B COMPLEX PO) Take 1 capsule by mouth every morning.   CALCIUM PO Take 1 tablet by mouth daily with lunch. Algae based calcium   Calcium Polycarbophil 625 MG CHEW Chew 625 mg by mouth daily.   Cholecalciferol 25 MCG (1000 UT) capsule Take 1,000 Units by mouth daily.   fenofibrate micronized (LOFIBRA) 200 MG capsule 200 mg daily with lunch.   Magnesium 250 MG TABS Take 250 mg by mouth every morning.   metoprolol succinate (TOPROL-XL) 25 MG 24 hr tablet Take 1 tablet (25 mg total) by mouth daily. Take with or immediately following a meal.   multivitamin-lutein (OCUVITE-LUTEIN) CAPS Take 1 capsule by mouth every morning.   omeprazole (PRILOSEC) 40 MG capsule Take 40 mg by mouth as needed.   tretinoin (RETIN-A) 0.1 % cream 1 application. daily as needed (rosacea).   venlafaxine (EFFEXOR) 75 MG tablet Take 75 mg by mouth every morning.   VITAMIN E PO Take by mouth daily.   No current facility-administered medications on file prior to visit.     Allergies:   Erythromycin and Lodine [etodolac]   Social History   Tobacco Use   Smoking status: Never   Smokeless tobacco: Never  Vaping Use   Vaping Use: Never used  Substance Use Topics   Alcohol use: Yes    Comment: very rare   Drug use: No    Family History: family history includes Colon cancer in her father and mother; Diabetes in her father; Hypertension in her mother and sister; Liver cancer in her father; Other in her mother; Prostate cancer in her father. There is no history of Breast cancer, Colon polyps,  Esophageal cancer, Rectal cancer, or Stomach cancer.  ROS:   Please see the history of present illness. (+) Palpitations All other systems are reviewed and negative.    EKGs/Labs/Other Studies Reviewed:    The following studies were reviewed today:  TTE 03/13/2021 ([place]): SUMMARY  Findings are consistent with hypertensive heart disease.   Recommend cardiology assessment.   There is mild concentric left ventricular hypertrophy with normal wall  motion, normal systolic function and ejection fraction '70-75%'.  Theere is basal septal hypertrophy and severe LVOT obstruction due to  systolic anterior motion of the mitral valve [SAM]. Peak LVOT gradient  at rest is severely elevated at 64 mmHg. Peak LVOT gradient with  valsalva is higher but not accurately defined- measured value of 212  mmHg likely reflects MR contamination of LVOT signal.  Mild diastolic dysfunction [Grade IB] with likely increased left  atrial pressure. E/e' '16 and 23'. E/e' < 8 is normal, > 15 is  suggestive of increased PCWP.  The right ventricle is  normal in size and function.  The atria are normal in size.  There is at least moderate mitral regurgitation due to [facility].  IVC size was normal.  There was insufficient TR detected to calculate RV systolic pressure.   There is no comparison study available.   EKG:  EKG is personally reviewed.   10/28/2021:  not ordered today 04/08/2021: NSR at 71 bpm, nonspecific T wave pattern  Recent Labs: 07/23/2021: ALT 14 08/14/2021: BUN 18; Creatinine, Ser 0.80; Hemoglobin 11.8; Platelets 269.0; Potassium 4.1; Sodium 139   Recent Lipid Panel No results found for: "CHOL", "TRIG", "HDL", "CHOLHDL", "VLDL", "LDLCALC", "LDLDIRECT"  Physical Exam:    VS:  BP 122/70   Pulse 75   Ht 5' 1.5" (1.562 m)   Wt 131 lb (59.4 kg)   LMP 06/18/2011   BMI 24.35 kg/m     Wt Readings from Last 3 Encounters:  10/28/21 131 lb (59.4 kg)  08/14/21 128 lb (58.1 kg)   07/22/21 125 lb (56.7 kg)    GEN: Well nourished, well developed in no acute distress HEENT: Normal, moist mucous membranes NECK: No JVD CARDIAC: regular rhythm, normal S1 and S2, no rubs or gallops. 3/6 LVOT murmur. No significant mitral murmur appreciated VASCULAR: Radial and DP pulses 2+ bilaterally. No carotid bruits RESPIRATORY:  Clear to auscultation without rales, wheezing or rhonchi  ABDOMEN: Soft, non-tender, non-distended MUSCULOSKELETAL:  Ambulates independently SKIN: Warm and dry, no edema NEUROLOGIC:  Alert and oriented x 3. No focal neuro deficits noted. PSYCHIATRIC:  Normal affect    ASSESSMENT:    1. Preop cardiovascular exam   2. LVH (left ventricular hypertrophy)   3. Left ventricular outflow tract obstruction   4. Essential hypertension   5. Mixed hyperlipidemia     PLAN:    Preoperative cardiovascular risk According to the Revised Cardiac Risk Index (RCRI), her Perioperative Risk of Major Cardiac Event is (%): 0.4  The patient is not currently having active cardiac symptoms, and they can achieve >4 METs of activity.  According to ACC/AHA Guidelines, no further testing is needed.  Proceed with surgery at acceptable risk.  Our service is available as needed in the peri-operative period.     Given her LVH/LVOT gradient, avoid hypotension and dehydration during surgery.  Focal basal septal hypertrophy Documented LVOT obstruction with SAM and mitral regurgitation -I cannot see images, but reported as focal basal septal hypertrophy with severe obstruction and SAM/MR -no syncope or limiting exertional symptoms -tolerating metoprolol, reviewed importance of BP control -reviewed red flag warning signs that need immediate medical attention -avoid dehydration  Hypertension: would keep tight control to prevent progression of LVH -continue metoprolol -monitor BP  Mixed hyperlipidemia -on fenofibrate -lipids from 03/27/21: Tchol 377, TG 487, HDL 35, LDL could  not be reported. Prior LDL 02/15/20 185 -I would strongly recommend statin. Continue to discuss at follow up. Consider calcium score.  Cardiac risk counseling and prevention recommendations: -recommend heart healthy/Mediterranean diet, with whole grains, fruits, vegetable, fish, lean meats, nuts, and olive oil. Limit salt. -recommend moderate walking, 3-5 times/week for 30-50 minutes each session. Aim for at least 150 minutes.week. Goal should be pace of 3 miles/hours, or walking 1.5 miles in 30 minutes -recommend avoidance of tobacco products. Avoid excess alcohol. -ASCVD risk score: The 10-year ASCVD risk score (Arnett DK, et al., 2019) is: 5.2%   Values used to calculate the score:     Age: 26 years     Sex: Female     Is Non-Hispanic  African American: No     Diabetic: No     Tobacco smoker: No     Systolic Blood Pressure: 448 mmHg     Is BP treated: Yes     HDL Cholesterol: 59 MG/DL     Total Cholesterol: 162 MG/DL    Plan for follow up: 1 year or sooner as needed.  Buford Dresser, MD, PhD, [facility]    Medication Adjustments/Labs and Tests Ordered: Current medicines are reviewed at length with the patient today.  Concerns regarding medicines are outlined above.   No orders of the defined types were placed in this encounter.  No orders of the defined types were placed in this encounter.  Patient Instructions  Medication Instructions:  Your Physician recommend you continue on your current medication as directed.    *If you need a refill on your cardiac medications before your next appointment, please call your pharmacy*   Lab Work: None ordered today   Testing/Procedures: None ordered today   Follow-Up: At [facility], you and your health needs are our priority.  As part of our continuing mission to provide you with exceptional heart care, we have created designated Provider Care Teams.  These Care Teams include your primary  Cardiologist (physician) and Advanced Practice Providers (APPs -  Physician Assistants and Nurse Practitioners) who all work together to provide you with the care you need, when you need it.  We recommend signing up for the patient portal called "MyChart".  Sign up information is provided on this After Visit Summary.  MyChart is used to connect with patients for Virtual Visits (Telemedicine).  Patients are able to view lab/test results, encounter notes, upcoming appointments, etc.  Non-urgent messages can be sent to your provider as well.   To learn more about what you can do with MyChart, go to [email or web address].    Your next appointment:   1 year(s)  The format for your next appointment:   In Person  Provider:   Buford Dresser, MD{           I,Mathew Stumpf,acting as a scribe for Buford Dresser, MD.,have documented all relevant documentation on the behalf of Buford Dresser, MD,as directed by  Buford Dresser, MD while in the presence of Buford Dresser, MD.  I, Buford Dresser, MD, have reviewed all documentation for this visit. The documentation on 10/29/21 for the exam, diagnosis, procedures, and orders are all accurate and complete.   Signed, Buford Dresser, MD PhD 10/29/2021 5:41 PM    [place]

## 2021-10-28 NOTE — Patient Instructions (Signed)

## 2021-10-29 ENCOUNTER — Encounter (HOSPITAL_BASED_OUTPATIENT_CLINIC_OR_DEPARTMENT_OTHER): Payer: Self-pay | Admitting: Cardiology

## 2021-11-12 ENCOUNTER — Ambulatory Visit: Payer: Self-pay | Admitting: General Surgery

## 2021-11-12 NOTE — H&P (Signed)
PROVIDER:  Monico Blitz, MD  MRN: [id number] DOB: 17-Apr-1955 DATE OF ENCOUNTER: 11/12/2021  Subjective   Chief Complaint: No chief complaint on file.     History of Present Illness: Gabrielle Gonzalez is a 66 y.o. female who is seen today as an office consultation at the request of Dr. Pasty Arch for evaluation of No chief complaint on file. .  Patient presented to the hospital in May 2023 with persistent left lower quadrant pain for the past month.  She had been on and off antibiotics for this.  CT scan was performed which showed a pelvic abscess measuring 3.7 x 6 cm.  She underwent aspiration.  She has subsequently recovered.  Follow-up CT scan shows a small residual abscess.  Patient had a colonoscopy in February 2022.  She is scheduled for follow-up colonoscopy in 2025.     Review of Systems: A complete review of systems was obtained from the patient.  I have reviewed this information and discussed as appropriate with the patient.  See HPI as well for other ROS.    Medical History: Past Medical History:  Diagnosis Date   Anxiety    Arthritis    GERD (gastroesophageal reflux disease)    Hyperlipidemia     There is no problem list on file for this patient.   Past Surgical History:  Procedure Laterality Date   CESAREAN SECTION     foot surgery       Allergies  Allergen Reactions   Erythromycin Nausea And Vomiting    Intolerant per pt   Etodolac Other (See Comments)    Intolerant per pt  GI burning   Intolerant per pt  GI burning   Penicillins Other (See Comments)    PATIENT HAS HAD ANCEF WITHOUT REACTIONS  > > > OK TO GIVE ANCEF WHEN ORDERED, PER MD < < <   09/04/16  Reaction unknown  PATIENT HAS HAD ANCEF WITHOUT REACTIONS  > > > OK TO GIVE ANCEF WHEN ORDERED, PER MD < < <   09/04/16    Has patient had a PCN reaction causing immediate rash, facial/tongue/throat swelling, SOB or lightheadedness with hypotension: Unknown  Has patient had a PCN reaction causing  severe rash involving mucus membranes or skin necrosis: Unknown  Has patient had a PCN reaction that required hospitalization: Unknown  Has patient had a PCN reaction occurring within the last 10 years: No  If all of the above an  PATIENT HAS HAD ANCEF WITHOUT REACTIONS  > > > OK TO GIVE ANCEF WHEN ORDERED, PER MD < < <   09/04/16  Reaction unknown    Current Outpatient Medications on File Prior to Visit  Medication Sig Dispense Refill   calcium polycarbophiL 500 mg Chew Take by mouth     cholecalciferol (VITAMIN D3) 1000 unit capsule Take by mouth     fenofibrate micronized (LOFIBRA) 200 MG capsule 200 mg daily with lunch.     magnesium 250 mg Tab Take by mouth     metroNIDAZOLE (FLAGYL) 500 MG tablet Take by mouth     omeprazole (PRILOSEC) 40 MG DR capsule Take 40 mg by mouth     venlafaxine (EFFEXOR) 75 MG tablet Take 1 tablet by mouth once daily     vit C,E-Zn-coppr-lutein-zeaxan (OCUVITE LUTEIN AND ZEAXANTHIN) 60 mg-13.5 mg- 15 mg-2 mg-6 mg Cap Take 1 capsule by mouth every morning     VITAMIN B COMPLEX ORAL Take 1 capsule by mouth once daily  No current facility-administered medications on file prior to visit.    Family History  Problem Relation Age of Onset   High blood pressure (Hypertension) Mother    Skin cancer Father    Deep vein thrombosis (DVT or abnormal blood clot formation) Father    Colon cancer Father    High blood pressure (Hypertension) Sister    Hyperlipidemia (Elevated cholesterol) Sister      Social History   Tobacco Use  Smoking Status Never  Smokeless Tobacco Never     Social History   Socioeconomic History   Marital status: Married  Tobacco Use   Smoking status: Never   Smokeless tobacco: Never  Vaping Use   Vaping Use: Never used  Substance and Sexual Activity   Alcohol use: Never   Drug use: Never    Objective:    There were no vitals filed for this visit.    Exam Gen: NAD Abd: soft    Labs, Imaging and Diagnostic  Testing: CT scan reviewed pre and post aspiration.  Colonoscopy report reviewed as well.  Assessment and Plan:  There are no diagnoses linked to this encounter.   66 year old female with complicated diverticulitis due to pelvic abscess sitting on the rectal wall.  We discussed the complications associated with this including damage to the rectum requiring low rectal anastomosis and diverting ileostomy.  We discussed the possibility of ureteral injury and the need for firefly identification prior to surgery.  We discussed the risk of leak associated with a colorectal anastomosis.  We would plan on performing a low anterior resection.  If we were unable to save the proximal rectum, she would need a temporary diverting ileostomy and would have a significant change in bowel habits afterwards.   The surgery and anatomy were described to the patient as well as the risks of surgery and the possible complications.  These include: Bleeding, deep abdominal infections and possible wound complications such as hernia and infection, damage to adjacent structures, leak of surgical connections, which can lead to other surgeries and possibly an ostomy, possible need for other procedures, such as abscess drains in radiology, possible prolonged hospital stay, possible diarrhea from removal of part of the colon, possible constipation from narcotics, possible bowel, bladder or sexual dysfunction if having rectal surgery, prolonged fatigue/weakness or appetite loss, possible early recurrence of of disease, possible complications of their medical problems such as heart disease or arrhythmias or lung problems, death (less than 1%). I believe the patient understands and wishes to proceed with the surgery.    [name], MD Colon and Rectal Surgery [facility]

## 2021-12-03 ENCOUNTER — Other Ambulatory Visit: Payer: Self-pay | Admitting: Urology

## 2021-12-30 NOTE — Patient Instructions (Signed)
SURGICAL WAITING ROOM VISITATION Patients having surgery or a procedure may have no more than 2 support people in the waiting area - these visitors may rotate.   Children under the age of 4 must have an adult with them who is not the patient. If the patient needs to stay at the hospital during part of their recovery, the visitor guidelines for inpatient rooms apply. Pre-op nurse will coordinate an appropriate time for 1 support person to accompany patient in pre-op.  This support person may not rotate.    Please refer to the [facility] website for the visitor guidelines for Inpatients (after your surgery is over and you are in a regular room).       Your procedure is scheduled on:  01/10/2022    Report to [facility] Main Entrance    Report to admitting at   [phone]   Call this number if you have problems the morning of surgery [phone]   Clear liquid diet on day of bowel prep     After Midnight you may have the following liquids until __ 0430____ AM DAY OF SURGERY  Water Non-Citrus Juices (without pulp, NO RED) Carbonated Beverages Black Coffee (NO MILK/CREAM OR CREAMERS, sugar ok)  Clear Tea (NO MILK/CREAM OR CREAMERS, sugar ok) regular and decaf                             Plain Jell-O (NO RED)                                           Fruit ices (not with fruit pulp, NO RED)                                     Popsicles (NO RED)                                                               Sports drinks like Gatorade (NO RED)              Drink 2 Ensure/G2 drinks AT 10:00 PM the night before surgery.        The day of surgery:  Drink ONE (1) Pre-Surgery Clear Ensure or G2 at    0430AM ( have completed by )  the morning of surgery. Drink in one sitting. Do not sip.  This drink was given to you during your hospital  pre-op appointment visit. Nothing else to drink after completing the  Pre-Surgery Clear Ensure or G2.          If you have questions, please  contact your surgeon's office.   FOLLOW BOWEL PREP AND ANY ADDITIONAL PRE OP INSTRUCTIONS YOU RECEIVED FROM YOUR SURGEON'S OFFICE!!!     Oral Hygiene is also important to reduce your risk of infection.                                    Remember - BRUSH YOUR TEETH THE MORNING OF SURGERY  WITH YOUR REGULAR TOOTHPASTE   Do NOT smoke after Midnight   Take these medicines the morning of surgery with A SIP OF WATER:  none   DO NOT TAKE ANY ORAL DIABETIC MEDICATIONS DAY OF YOUR SURGERY  Bring CPAP mask and tubing day of surgery.                              You may not have any metal on your body including hair pins, jewelry, and body piercing             Do not wear make-up, lotions, powders, perfumes/cologne, or deodorant  Do not wear nail polish including gel and S&S, artificial/acrylic nails, or any other type of covering on natural nails including finger and toenails. If you have artificial nails, gel coating, etc. that needs to be removed by a nail salon please have this removed prior to surgery or surgery may need to be canceled/ delayed if the surgeon/ anesthesia feels like they are unable to be safely monitored.   Do not shave  48 hours prior to surgery.               Men may shave face and neck.   Do not bring valuables to the hospital. [place].   Contacts, dentures or bridgework may not be worn into surgery.   Bring small overnight bag day of surgery.   DO NOT [place]. PHARMACY WILL DISPENSE MEDICATIONS LISTED ON YOUR MEDICATION LIST TO YOU DURING YOUR ADMISSION [place]!    Patients discharged on the day of surgery will not be allowed to drive home.  Someone NEEDS to stay with you for the first 24 hours after anesthesia.   Special Instructions: Bring a copy of your healthcare power of attorney and living will documents the day of surgery if you haven't scanned them before.               Please read over the following fact sheets you were given: IF [place] [phone]   If you received a COVID test during your pre-op visit  it is requested that you wear a mask when out in public, stay away from anyone that may not be feeling well and notify your surgeon if you develop symptoms. If you test positive for Covid or have been in contact with anyone that has tested positive in the last 10 days please notify you surgeon.     [facility] - Preparing for Surgery Before surgery, you can play an important role.  Because skin is not sterile, your skin needs to be as free of germs as possible.  You can reduce the number of germs on your skin by washing with CHG (chlorahexidine gluconate) soap before surgery.  CHG is an antiseptic cleaner which kills germs and bonds with the skin to continue killing germs even after washing. Please DO NOT use if you have an allergy to CHG or antibacterial soaps.  If your skin becomes reddened/irritated stop using the CHG and inform your nurse when you arrive at Short Stay. Do not shave (including legs and underarms) for at least 48 hours prior to the first CHG shower.  You may shave your face/neck. Please follow these instructions carefully:  1.  Shower  with CHG Soap the night before surgery and the  morning of Surgery.  2.  If you choose to wash your hair, wash your hair first as usual with your  normal  shampoo.  3.  After you shampoo, rinse your hair and body thoroughly to remove the  shampoo.                           4.  Use CHG as you would any other liquid soap.  You can apply chg directly  to the skin and wash                       Gently with a scrungie or clean washcloth.  5.  Apply the CHG Soap to your body ONLY FROM THE NECK DOWN.   Do not use on face/ open                           Wound or open sores. Avoid contact with eyes, ears mouth and genitals (private parts).                        Wash face,  Genitals (private parts) with your normal soap.             6.  Wash thoroughly, paying special attention to the area where your surgery  will be performed.  7.  Thoroughly rinse your body with warm water from the neck down.  8.  DO NOT shower/wash with your normal soap after using and rinsing off  the CHG Soap.                9.  Pat yourself dry with a clean towel.            10.  Wear clean pajamas.            11.  Place clean sheets on your bed the night of your first shower and do not  sleep with pets. Day of Surgery : Do not apply any lotions/deodorants the morning of surgery.  Please wear clean clothes to the hospital/surgery center.  FAILURE TO FOLLOW THESE INSTRUCTIONS MAY RESULT IN THE CANCELLATION OF YOUR SURGERY PATIENT SIGNATURE_________________________________  NURSE SIGNATURE__________________________________  ________________________________________________________________________

## 2021-12-30 NOTE — Progress Notes (Signed)
Anesthesia Review:  PCP: Cardiologist : Gabrielle Gonzalez 10/28/21  Chest x-ray : EKG : 03/12/21  Echo : Stress test: Cardiac Cath :  Activity level:  Sleep Study/ CPAP : Fasting Blood Sugar :      / Checks Blood Sugar -- times a day:   Blood Thinner/ Instructions /Last Dose: ASA / Instructions/ Last Dose :

## 2022-01-01 ENCOUNTER — Encounter (HOSPITAL_COMMUNITY): Payer: Self-pay

## 2022-01-01 ENCOUNTER — Other Ambulatory Visit: Payer: Self-pay

## 2022-01-01 ENCOUNTER — Encounter (HOSPITAL_COMMUNITY)
Admission: RE | Admit: 2022-01-01 | Discharge: 2022-01-01 | Disposition: A | Discharge status: Home / Self Care | Payer: BC Managed Care – PPO | Source: Ambulatory Visit | Attending: General Surgery | Admitting: General Surgery

## 2022-01-01 VITALS — BP 110/82 | HR 58 | Temp 98.1°F | Resp 16 | Ht 61.5 in | Wt 132.0 lb

## 2022-01-01 DIAGNOSIS — Z01812 Encounter for preprocedural laboratory examination: Secondary | ICD-10-CM | POA: Diagnosis present

## 2022-01-01 DIAGNOSIS — Z01818 Encounter for other preprocedural examination: Secondary | ICD-10-CM

## 2022-01-01 DIAGNOSIS — K5790 Diverticulosis of intestine, part unspecified, without perforation or abscess without bleeding: Secondary | ICD-10-CM | POA: Insufficient documentation

## 2022-01-01 DIAGNOSIS — I10 Essential (primary) hypertension: Secondary | ICD-10-CM | POA: Insufficient documentation

## 2022-01-01 HISTORY — DX: Cardiac murmur, unspecified: R01.1

## 2022-01-01 LAB — BASIC METABOLIC PANEL
Anion gap: 8 (ref 5–15)
BUN: 18 mg/dL (ref 8–23)
CO2: 24 mmol/L (ref 22–32)
Calcium: 9.1 mg/dL (ref 8.9–10.3)
Chloride: 106 mmol/L (ref 98–111)
Creatinine, Ser: 0.96 mg/dL (ref 0.44–1.00)
GFR, Estimated: >60 mL/min (ref >60)
Glucose, Bld: 144 mg/dL — ABNORMAL HIGH (ref 70–99)
Potassium: 3.8 mmol/L (ref 3.5–5.1)
Sodium: 138 mmol/L (ref 135–145)

## 2022-01-01 LAB — CBC
HCT: 39.1 % (ref 36.0–46.0)
Hemoglobin: 12.5 g/dL (ref 12.0–15.0)
MCH: 29.3 pg (ref 26.0–34.0)
MCHC: 32 g/dL (ref 30.0–36.0)
MCV: 91.6 fL (ref 80.0–100.0)
Platelets: 286 10*3/uL (ref 150–400)
RBC: 4.27 MIL/uL (ref 3.87–5.11)
RDW: 13.4 % (ref 11.5–15.5)
WBC: 5.7 10*3/uL (ref 4.0–10.5)
nRBC: 0 % (ref 0.0–0.2)

## 2022-01-01 LAB — HEMOGLOBIN A1C
Hgb A1c MFr Bld: 6 % — ABNORMAL HIGH (ref 4.8–5.6)
Mean Plasma Glucose: 125.5 mg/dL

## 2022-01-01 NOTE — Consult Note (Signed)
[place] [facility] Nurse requested for preoperative stoma site marking  Discussed surgical procedure and stoma creation with patient. Explained role of the [facility] nurse team.  Provided the patient with educational booklet and provided samples of pouching options.  Answered patient questions. She states she has a 20% chance of getting a stoma  Has questions related to colostomy vs ileostomy.  We discuss the differences in stool characteristics, monitoring for dehydration or blockage with ileostomy.  Twice weekly pouch changes.    Examined patient sitting, and standing in order to place the marking in the patient's visual field, away from any creases or abdominal contour issues and within the rectus muscle.  Marked below umbilicus.  Has creasing just above umbilicus at belt line.    Marked for ileostomy in the RLQ  4 cm to the right of the umbilicus and  3 cm below the umbilicus.    Patient's abdomen cleansed with CHG wipes at site markings, allowed to air dry prior to marking.Covered mark with thin film transparent dressing to preserve mark until date of surgery.   [facility] Nurse team will follow up with patient after surgery for continue ostomy care and teaching.   Domenic Moras MSN, RN, FNP-BC CWON Wound, Ostomy, Continence Nurse Pager [phone]

## 2022-01-02 NOTE — Progress Notes (Signed)
Anesthesia Chart Review   Case: [id number] Date/Time: 01/09/22 0715   Procedures:      LOW ANTERIOR RESECTION     POSSIBLE ILEOSTOMY     CYSTOSCOPY with FIREFLY INJECTION   Anesthesia type: General   Pre-op diagnosis: DIVERTICULAR DISEASE   Location: [facility] ROOM 02 / [facility]   Surgeons: Gabrielle Ruff, MD; Gabrielle Gallo, MD       DISCUSSION:66 y.o. never smoker with h/o HTN, diverticular disease scheduled for above procedure 56/10/1273 with Dr. Leighton Gonzalez and Dr. Franchot Gonzalez.   Pt seen by cardiology 10/28/2021. Per OV note, "According to the Revised Cardiac Risk Index (RCRI), her Perioperative Risk of Major Cardiac Event is (%): 0.4   The patient is not currently having active cardiac symptoms, and they can achieve >4 METs of activity.   According to ACC/AHA Guidelines, no further testing is needed.  Proceed with surgery at acceptable risk.  Our service is available as needed in the peri-operative period.      Given her LVH/LVOT gradient, avoid hypotension and dehydration during surgery."  Anticipate pt can proceed with planned procedure barring acute status change.   VS: BP 110/82   Pulse (!) 58   Temp 36.7 C (Oral)   Resp 16   Ht 5' 1.5" (1.562 m)   Wt 59.9 kg   LMP 06/18/2011   SpO2 (!) 59%   BMI 24.54 kg/m   PROVIDERS: Gabrielle Harvey, NP is PCP   Gabrielle Dresser, MD is Cardiologist  LABS: Labs reviewed: Acceptable for surgery. (all labs ordered are listed, but only abnormal results are displayed)  Labs Reviewed  BASIC METABOLIC PANEL - Abnormal; Notable for the following components:      Result Value   Glucose, Bld 144 (*)    All other components within normal limits  HEMOGLOBIN A1C - Abnormal; Notable for the following components:   Hgb A1c MFr Bld 6.0 (*)    All other components within normal limits  CBC  TYPE AND SCREEN     IMAGES:   EKG:   CV:  Past Medical History:  Diagnosis Date   Anxiety state, unspecified    Arthritis     Colonic diverticular abscess 07/22/2021   Depressive disorder, not elsewhere classified    Diverticulosis    Essential hypertension 04/09/2021   GERD (gastroesophageal reflux disease)    GERD without esophagitis 05/25/2017   Headache, chronic daily    Heart murmur    Hiatal hernia    Hyperlipidemia    Hyperplastic colon polyp    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   LVH (left ventricular hypertrophy) 04/09/2021   Osteoporosis    Panic disorder without agoraphobia    Rosacea    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   Urinary incontinence    Stress incontinence    Past Surgical History:  Procedure Laterality Date   BREAST BIOPSY Left 2009   CESAREAN SECTION     COLONOSCOPY  2019   FOOT SURGERY     ORIF HUMERUS FRACTURE Right 09/04/2016   Procedure: OPEN REDUCTION INTERNAL FIXATION (ORIF) PROXIMAL HUMERUS FRACTURE;  Surgeon: Justice Britain, MD;  Location: [facility];  Service: Orthopedics;  Laterality: Right;    MEDICATIONS:  acetaminophen (TYLENOL) 500 MG tablet   B Complex Vitamins (VITAMIN B COMPLEX PO)   Cholecalciferol (VITAMIN D) 50 MCG (2000 UT) tablet   fenofibrate micronized (LOFIBRA) 200 MG capsule   Magnesium 400 MG TABS   metoprolol succinate (TOPROL-XL) 25 MG 24  hr tablet   Multiple Vitamins-Minerals (ALGAE BASED CALCIUM) TABS   Multiple Vitamins-Minerals (OCUVITE ADULT 50+) CAPS   omeprazole (PRILOSEC OTC) 20 MG tablet   polyethylene glycol (MIRALAX / GLYCOLAX) 17 g packet   tretinoin (RETIN-A) 0.1 % cream   venlafaxine (EFFEXOR) 75 MG tablet   No current facility-administered medications for this encounter.   Gabrielle Felix Ward, PA-C [facility] Pre-Surgical Testing [phone]

## 2022-01-08 NOTE — Anesthesia Preprocedure Evaluation (Signed)
Anesthesia Evaluation  Patient identified by MRN, date of birth, ID band Patient awake    Reviewed: Allergy & Precautions, NPO status , Patient's Chart, lab work & pertinent test results  Airway Mallampati: II  TM Distance: >3 FB Neck ROM: Full    Dental no notable dental hx.    Pulmonary neg pulmonary ROS   Pulmonary exam normal        Cardiovascular hypertension, Pt. on home beta blockers Normal cardiovascular exam     Neuro/Psych  Headaches PSYCHIATRIC DISORDERS Anxiety Depression     Neuromuscular disease    GI/Hepatic Neg liver ROS, hiatal hernia,GERD  Medicated and Controlled,,  Endo/Other  negative endocrine ROS    Renal/GU negative Renal ROS     Musculoskeletal  (+) Arthritis ,    Abdominal   Peds  Hematology negative hematology ROS (+)   Anesthesia Other Findings DIVERTICULAR DISEASE  Reproductive/Obstetrics                             Anesthesia Physical Anesthesia Plan  ASA: 2  Anesthesia Plan: General   Post-op Pain Management:    Induction: Intravenous  PONV Risk Score and Plan: 4 or greater and Ondansetron, Dexamethasone, Midazolam, Treatment may vary due to age or medical condition and Propofol infusion  Airway Management Planned: Oral ETT  Additional Equipment:   Intra-op Plan:   Post-operative Plan: Extubation in OR  Informed Consent: I have reviewed the patients History and Physical, chart, labs and discussed the procedure including the risks, benefits and alternatives for the proposed anesthesia with the patient or authorized representative who has indicated his/her understanding and acceptance.     Dental advisory given  Plan Discussed with: CRNA  Anesthesia Plan Comments:         Anesthesia Quick Evaluation

## 2022-01-09 ENCOUNTER — Encounter (HOSPITAL_COMMUNITY)
Admission: AD | Disposition: A | Discharge status: Home / Self Care | Payer: Self-pay | Source: Ambulatory Visit | Attending: General Surgery

## 2022-01-09 ENCOUNTER — Encounter (HOSPITAL_COMMUNITY): Payer: Self-pay | Admitting: General Surgery

## 2022-01-09 ENCOUNTER — Other Ambulatory Visit: Payer: Self-pay

## 2022-01-09 ENCOUNTER — Ambulatory Visit (HOSPITAL_COMMUNITY): Payer: BC Managed Care – PPO | Admitting: Physician Assistant

## 2022-01-09 ENCOUNTER — Ambulatory Visit (HOSPITAL_COMMUNITY): Payer: BC Managed Care – PPO | Admitting: Certified Registered Nurse Anesthetist

## 2022-01-09 ENCOUNTER — Inpatient Hospital Stay (HOSPITAL_COMMUNITY)
Admission: AD | Admit: 2022-01-09 | Discharge: 2022-01-11 | DRG: 331 | Disposition: A | Discharge status: Home / Self Care | Payer: BC Managed Care – PPO | Source: Ambulatory Visit | Attending: General Surgery | Admitting: General Surgery

## 2022-01-09 DIAGNOSIS — F32A Depression, unspecified: Secondary | ICD-10-CM | POA: Diagnosis present

## 2022-01-09 DIAGNOSIS — F411 Generalized anxiety disorder: Secondary | ICD-10-CM | POA: Diagnosis present

## 2022-01-09 DIAGNOSIS — Z888 Allergy status to other drugs, medicaments and biological substances status: Secondary | ICD-10-CM | POA: Diagnosis not present

## 2022-01-09 DIAGNOSIS — K5732 Diverticulitis of large intestine without perforation or abscess without bleeding: Secondary | ICD-10-CM | POA: Diagnosis present

## 2022-01-09 DIAGNOSIS — Z8042 Family history of malignant neoplasm of prostate: Secondary | ICD-10-CM

## 2022-01-09 DIAGNOSIS — K219 Gastro-esophageal reflux disease without esophagitis: Secondary | ICD-10-CM | POA: Diagnosis present

## 2022-01-09 DIAGNOSIS — Z86010 Personal history of colonic polyps: Secondary | ICD-10-CM

## 2022-01-09 DIAGNOSIS — Z8249 Family history of ischemic heart disease and other diseases of the circulatory system: Secondary | ICD-10-CM | POA: Diagnosis not present

## 2022-01-09 DIAGNOSIS — Z808 Family history of malignant neoplasm of other organs or systems: Secondary | ICD-10-CM | POA: Diagnosis not present

## 2022-01-09 DIAGNOSIS — Z833 Family history of diabetes mellitus: Secondary | ICD-10-CM | POA: Diagnosis not present

## 2022-01-09 DIAGNOSIS — Z01818 Encounter for other preprocedural examination: Principal | ICD-10-CM

## 2022-01-09 DIAGNOSIS — K660 Peritoneal adhesions (postprocedural) (postinfection): Secondary | ICD-10-CM | POA: Diagnosis present

## 2022-01-09 DIAGNOSIS — Z79899 Other long term (current) drug therapy: Secondary | ICD-10-CM | POA: Diagnosis not present

## 2022-01-09 DIAGNOSIS — M1990 Unspecified osteoarthritis, unspecified site: Secondary | ICD-10-CM | POA: Diagnosis present

## 2022-01-09 DIAGNOSIS — Z800 Family history of malignant neoplasm of digestive organs: Secondary | ICD-10-CM | POA: Diagnosis not present

## 2022-01-09 DIAGNOSIS — E785 Hyperlipidemia, unspecified: Secondary | ICD-10-CM | POA: Diagnosis present

## 2022-01-09 DIAGNOSIS — R519 Headache, unspecified: Secondary | ICD-10-CM | POA: Diagnosis present

## 2022-01-09 DIAGNOSIS — I10 Essential (primary) hypertension: Secondary | ICD-10-CM | POA: Diagnosis present

## 2022-01-09 DIAGNOSIS — Z881 Allergy status to other antibiotic agents status: Secondary | ICD-10-CM

## 2022-01-09 DIAGNOSIS — M810 Age-related osteoporosis without current pathological fracture: Secondary | ICD-10-CM | POA: Diagnosis present

## 2022-01-09 DIAGNOSIS — K5790 Diverticulosis of intestine, part unspecified, without perforation or abscess without bleeding: Secondary | ICD-10-CM | POA: Diagnosis present

## 2022-01-09 HISTORY — PX: XI ROBOTIC ASSISTED LOWER ANTERIOR RESECTION: SHX6558

## 2022-01-09 HISTORY — PX: DIVERTING ILEOSTOMY: SHX5799

## 2022-01-09 LAB — TYPE AND SCREEN
ABO/RH(D): A POS
Antibody Screen: NEGATIVE

## 2022-01-09 LAB — ABO/RH: ABO/RH(D): A POS

## 2022-01-09 SURGERY — RESECTION, RECTUM, LOW ANTERIOR, ROBOT-ASSISTED
Anesthesia: General

## 2022-01-09 MED ORDER — PROPOFOL 1000 MG/100ML IV EMUL
INTRAVENOUS | Status: AC
  Filled 2022-01-09: qty 100

## 2022-01-09 MED ORDER — ENSURE PRE-SURGERY PO LIQD
592.0000 mL | Freq: Once | ORAL | Status: DC
  Filled 2022-01-09: qty 592

## 2022-01-09 MED ORDER — METOPROLOL SUCCINATE ER 25 MG PO TB24
25.0000 mg | ORAL_TABLET | Freq: Every day | ORAL | Status: DC
  Administered 2022-01-10 – 2022-01-11 (×2): 25 mg via ORAL
  Filled 2022-01-09 (×2): qty 1

## 2022-01-09 MED ORDER — SODIUM CHLORIDE 0.9 % IV SOLN
2.0000 g | INTRAVENOUS | Status: AC
  Administered 2022-01-09: 2 g via INTRAVENOUS
  Filled 2022-01-09: qty 2

## 2022-01-09 MED ORDER — ONDANSETRON HCL 4 MG PO TABS: 4.0000 mg | ORAL_TABLET | Freq: Four times a day (QID) | ORAL | Status: DC | PRN

## 2022-01-09 MED ORDER — FENTANYL CITRATE (PF) 250 MCG/5ML IJ SOLN
INTRAMUSCULAR | Status: AC
  Filled 2022-01-09: qty 5

## 2022-01-09 MED ORDER — LIDOCAINE HCL (PF) 2 % IJ SOLN
INTRAMUSCULAR | Status: DC | PRN
  Administered 2022-01-09: 1.5 mg/kg/h via INTRADERMAL

## 2022-01-09 MED ORDER — ALUM & MAG HYDROXIDE-SIMETH 200-200-20 MG/5ML PO SUSP: 30.0000 mL | Freq: Four times a day (QID) | ORAL | Status: DC | PRN

## 2022-01-09 MED ORDER — DEXAMETHASONE SODIUM PHOSPHATE 10 MG/ML IJ SOLN
INTRAMUSCULAR | Status: AC
  Filled 2022-01-09: qty 1

## 2022-01-09 MED ORDER — PANTOPRAZOLE SODIUM 40 MG PO TBEC: 40.0000 mg | DELAYED_RELEASE_TABLET | Freq: Every day | ORAL | Status: DC | PRN

## 2022-01-09 MED ORDER — BUPIVACAINE LIPOSOME 1.3 % IJ SUSP: 20.0000 mL | Freq: Once | INTRAMUSCULAR | Status: DC

## 2022-01-09 MED ORDER — ROCURONIUM BROMIDE 10 MG/ML (PF) SYRINGE
PREFILLED_SYRINGE | INTRAVENOUS | Status: AC
  Filled 2022-01-09: qty 10

## 2022-01-09 MED ORDER — 0.9 % SODIUM CHLORIDE (POUR BTL) OPTIME
TOPICAL | Status: DC | PRN
  Administered 2022-01-09: 1000 mL

## 2022-01-09 MED ORDER — SPY AGENT GREEN - (INDOCYANINE FOR INJECTION)
INTRAMUSCULAR | Status: DC | PRN
  Administered 2022-01-09: 3 mL via INTRAVENOUS

## 2022-01-09 MED ORDER — STERILE WATER FOR INJECTION IJ SOLN
INTRAMUSCULAR | Status: DC | PRN
  Administered 2022-01-09: 15 mL via INTRAVESICAL

## 2022-01-09 MED ORDER — CHLORHEXIDINE GLUCONATE 0.12 % MT SOLN
15.0000 mL | Freq: Once | OROMUCOSAL | Status: AC
  Administered 2022-01-09: 15 mL via OROMUCOSAL

## 2022-01-09 MED ORDER — BUPIVACAINE LIPOSOME 1.3 % IJ SUSP
INTRAMUSCULAR | Status: AC
  Filled 2022-01-09: qty 20

## 2022-01-09 MED ORDER — ONDANSETRON HCL 4 MG/2ML IJ SOLN
INTRAMUSCULAR | Status: AC
  Filled 2022-01-09: qty 2

## 2022-01-09 MED ORDER — AMISULPRIDE (ANTIEMETIC) 5 MG/2ML IV SOLN: 10.0000 mg | Freq: Once | INTRAVENOUS | Status: DC | PRN

## 2022-01-09 MED ORDER — VENLAFAXINE HCL ER 75 MG PO CP24
75.0000 mg | ORAL_CAPSULE | Freq: Every day | ORAL | Status: DC
  Administered 2022-01-10 – 2022-01-11 (×2): 75 mg via ORAL
  Filled 2022-01-09 (×2): qty 1

## 2022-01-09 MED ORDER — KETAMINE HCL 10 MG/ML IJ SOLN
INTRAMUSCULAR | Status: AC
  Filled 2022-01-09: qty 1

## 2022-01-09 MED ORDER — SODIUM CHLORIDE 0.9 % IR SOLN
Status: DC | PRN
  Administered 2022-01-09: 1000 mL

## 2022-01-09 MED ORDER — BISACODYL 5 MG PO TBEC: 20.0000 mg | DELAYED_RELEASE_TABLET | Freq: Once | ORAL | Status: DC

## 2022-01-09 MED ORDER — EPHEDRINE 5 MG/ML INJ
INTRAVENOUS | Status: AC
  Filled 2022-01-09: qty 5

## 2022-01-09 MED ORDER — BUPIVACAINE LIPOSOME 1.3 % IJ SUSP
INTRAMUSCULAR | Status: DC | PRN
  Administered 2022-01-09: 20 mL

## 2022-01-09 MED ORDER — HEPARIN SODIUM (PORCINE) 5000 UNIT/ML IJ SOLN
5000.0000 [IU] | Freq: Once | INTRAMUSCULAR | Status: AC
  Administered 2022-01-09: 5000 [IU] via SUBCUTANEOUS
  Filled 2022-01-09: qty 1

## 2022-01-09 MED ORDER — FENTANYL CITRATE PF 50 MCG/ML IJ SOSY: 25.0000 ug | PREFILLED_SYRINGE | INTRAMUSCULAR | Status: DC | PRN

## 2022-01-09 MED ORDER — GABAPENTIN 300 MG PO CAPS
300.0000 mg | ORAL_CAPSULE | Freq: Two times a day (BID) | ORAL | Status: DC
  Administered 2022-01-09 – 2022-01-11 (×4): 300 mg via ORAL
  Filled 2022-01-09 (×4): qty 1

## 2022-01-09 MED ORDER — MIDAZOLAM HCL 5 MG/5ML IJ SOLN
INTRAMUSCULAR | Status: DC | PRN
  Administered 2022-01-09: 2 mg via INTRAVENOUS

## 2022-01-09 MED ORDER — ONDANSETRON HCL 4 MG/2ML IJ SOLN
INTRAMUSCULAR | Status: DC | PRN
  Administered 2022-01-09: 4 mg via INTRAVENOUS

## 2022-01-09 MED ORDER — PROMETHAZINE HCL 25 MG/ML IJ SOLN: 6.2500 mg | INTRAMUSCULAR | Status: DC | PRN

## 2022-01-09 MED ORDER — PHENYLEPHRINE 80 MCG/ML (10ML) SYRINGE FOR IV PUSH (FOR BLOOD PRESSURE SUPPORT)
PREFILLED_SYRINGE | INTRAVENOUS | Status: AC
  Filled 2022-01-09: qty 10

## 2022-01-09 MED ORDER — PHENYLEPHRINE HCL-NACL 20-0.9 MG/250ML-% IV SOLN
INTRAVENOUS | Status: DC | PRN
  Administered 2022-01-09: 30 ug/min via INTRAVENOUS

## 2022-01-09 MED ORDER — PROPOFOL 10 MG/ML IV BOLUS
INTRAVENOUS | Status: DC | PRN
  Administered 2022-01-09: 10 mg via INTRAVENOUS

## 2022-01-09 MED ORDER — HYDROMORPHONE HCL 1 MG/ML IJ SOLN
0.5000 mg | INTRAMUSCULAR | Status: DC | PRN
  Administered 2022-01-10 – 2022-01-11 (×2): 0.5 mg via INTRAVENOUS
  Filled 2022-01-09 (×2): qty 0.5

## 2022-01-09 MED ORDER — BUPIVACAINE-EPINEPHRINE 0.5% -1:200000 IJ SOLN
INTRAMUSCULAR | Status: DC | PRN
  Administered 2022-01-09: 30 mL

## 2022-01-09 MED ORDER — OXYCODONE HCL 5 MG/5ML PO SOLN: 5.0000 mg | Freq: Once | ORAL | Status: DC | PRN

## 2022-01-09 MED ORDER — ENOXAPARIN SODIUM 40 MG/0.4ML IJ SOSY
40.0000 mg | PREFILLED_SYRINGE | INTRAMUSCULAR | Status: DC
  Administered 2022-01-10 – 2022-01-11 (×2): 40 mg via SUBCUTANEOUS
  Filled 2022-01-09 (×2): qty 0.4

## 2022-01-09 MED ORDER — EPHEDRINE SULFATE-NACL 50-0.9 MG/10ML-% IV SOSY
PREFILLED_SYRINGE | INTRAVENOUS | Status: DC | PRN
  Administered 2022-01-09 (×3): 5 mg via INTRAVENOUS
  Administered 2022-01-09: 7.5 mg via INTRAVENOUS

## 2022-01-09 MED ORDER — ORAL CARE MOUTH RINSE: 15.0000 mL | Freq: Once | OROMUCOSAL | Status: AC

## 2022-01-09 MED ORDER — MAGNESIUM OXIDE -MG SUPPLEMENT 400 (240 MG) MG PO TABS
400.0000 mg | ORAL_TABLET | Freq: Every day | ORAL | Status: DC
  Administered 2022-01-10 – 2022-01-11 (×2): 400 mg via ORAL
  Filled 2022-01-09 (×2): qty 1

## 2022-01-09 MED ORDER — ROCURONIUM BROMIDE 10 MG/ML (PF) SYRINGE
PREFILLED_SYRINGE | INTRAVENOUS | Status: DC | PRN
  Administered 2022-01-09: 60 mg via INTRAVENOUS
  Administered 2022-01-09: 40 mg via INTRAVENOUS

## 2022-01-09 MED ORDER — ENSURE SURGERY PO LIQD
237.0000 mL | Freq: Two times a day (BID) | ORAL | Status: DC
  Administered 2022-01-09 – 2022-01-10 (×2): 237 mL via ORAL

## 2022-01-09 MED ORDER — ONDANSETRON HCL 4 MG/2ML IJ SOLN: 4.0000 mg | Freq: Four times a day (QID) | INTRAMUSCULAR | Status: DC | PRN

## 2022-01-09 MED ORDER — KCL IN DEXTROSE-NACL 20-5-0.45 MEQ/L-%-% IV SOLN
INTRAVENOUS | Status: DC
  Filled 2022-01-09 (×2): qty 1000

## 2022-01-09 MED ORDER — FENOFIBRATE 160 MG PO TABS
160.0000 mg | ORAL_TABLET | Freq: Every day | ORAL | Status: DC
  Administered 2022-01-10: 160 mg via ORAL
  Filled 2022-01-09: qty 1

## 2022-01-09 MED ORDER — METHYLENE BLUE 1 % INJ SOLN: INTRAVENOUS | Status: DC | PRN

## 2022-01-09 MED ORDER — LIDOCAINE 2% (20 MG/ML) 5 ML SYRINGE
INTRAMUSCULAR | Status: DC | PRN
  Administered 2022-01-09: 60 mg via INTRAVENOUS

## 2022-01-09 MED ORDER — GABAPENTIN 300 MG PO CAPS
300.0000 mg | ORAL_CAPSULE | ORAL | Status: AC
  Administered 2022-01-09: 300 mg via ORAL
  Filled 2022-01-09: qty 1

## 2022-01-09 MED ORDER — OXYCODONE HCL 5 MG PO TABS: 5.0000 mg | ORAL_TABLET | Freq: Once | ORAL | Status: DC | PRN

## 2022-01-09 MED ORDER — 0.9 % SODIUM CHLORIDE (POUR BTL) OPTIME
TOPICAL | Status: DC | PRN
  Administered 2022-01-09: 2000 mL

## 2022-01-09 MED ORDER — ACETAMINOPHEN 500 MG PO TABS
1000.0000 mg | ORAL_TABLET | ORAL | Status: AC
  Administered 2022-01-09: 1000 mg via ORAL
  Filled 2022-01-09: qty 2

## 2022-01-09 MED ORDER — MIDAZOLAM HCL 2 MG/2ML IJ SOLN
INTRAMUSCULAR | Status: AC
  Filled 2022-01-09: qty 2

## 2022-01-09 MED ORDER — LACTATED RINGERS IV SOLN: INTRAVENOUS | Status: DC

## 2022-01-09 MED ORDER — LACTATED RINGERS IV SOLN: INTRAVENOUS | Status: DC | PRN

## 2022-01-09 MED ORDER — BUPIVACAINE-EPINEPHRINE (PF) 0.5% -1:200000 IJ SOLN
INTRAMUSCULAR | Status: AC
  Filled 2022-01-09: qty 30

## 2022-01-09 MED ORDER — LACTATED RINGERS IR SOLN
Status: DC | PRN
  Administered 2022-01-09: 1000 mL

## 2022-01-09 MED ORDER — DEXAMETHASONE SODIUM PHOSPHATE 4 MG/ML IJ SOLN
INTRAMUSCULAR | Status: DC | PRN
  Administered 2022-01-09: 5 mg via INTRAVENOUS

## 2022-01-09 MED ORDER — LIDOCAINE HCL 2 % IJ SOLN
INTRAMUSCULAR | Status: AC
  Filled 2022-01-09: qty 20

## 2022-01-09 MED ORDER — PROPOFOL 500 MG/50ML IV EMUL
INTRAVENOUS | Status: DC | PRN
  Administered 2022-01-09: 25 ug/kg/min via INTRAVENOUS

## 2022-01-09 MED ORDER — STERILE WATER FOR INJECTION IJ SOLN
INTRAMUSCULAR | Status: AC
  Filled 2022-01-09: qty 10

## 2022-01-09 MED ORDER — ALVIMOPAN 12 MG PO CAPS
12.0000 mg | ORAL_CAPSULE | Freq: Two times a day (BID) | ORAL | Status: DC
  Administered 2022-01-10: 12 mg via ORAL
  Filled 2022-01-09: qty 1

## 2022-01-09 MED ORDER — SACCHAROMYCES BOULARDII 250 MG PO CAPS
250.0000 mg | ORAL_CAPSULE | Freq: Two times a day (BID) | ORAL | Status: DC
  Administered 2022-01-09 – 2022-01-11 (×4): 250 mg via ORAL
  Filled 2022-01-09 (×4): qty 1

## 2022-01-09 MED ORDER — KETAMINE HCL 10 MG/ML IJ SOLN
INTRAMUSCULAR | Status: DC | PRN
  Administered 2022-01-09: 30 mg via INTRAVENOUS

## 2022-01-09 MED ORDER — ENSURE PRE-SURGERY PO LIQD
296.0000 mL | Freq: Once | ORAL | Status: DC
  Filled 2022-01-09: qty 296

## 2022-01-09 MED ORDER — ALVIMOPAN 12 MG PO CAPS
12.0000 mg | ORAL_CAPSULE | ORAL | Status: AC
  Administered 2022-01-09: 12 mg via ORAL
  Filled 2022-01-09: qty 1

## 2022-01-09 MED ORDER — FENTANYL CITRATE (PF) 250 MCG/5ML IJ SOLN
INTRAMUSCULAR | Status: DC | PRN
  Administered 2022-01-09: 50 ug via INTRAVENOUS
  Administered 2022-01-09: 150 ug via INTRAVENOUS
  Administered 2022-01-09: 50 ug via INTRAVENOUS

## 2022-01-09 MED ORDER — POLYETHYLENE GLYCOL 3350 17 GM/SCOOP PO POWD: 1.0000 | Freq: Once | ORAL | Status: DC

## 2022-01-09 MED ORDER — PHENYLEPHRINE 80 MCG/ML (10ML) SYRINGE FOR IV PUSH (FOR BLOOD PRESSURE SUPPORT)
PREFILLED_SYRINGE | INTRAVENOUS | Status: DC | PRN
  Administered 2022-01-09: 80 ug via INTRAVENOUS
  Administered 2022-01-09: 120 ug via INTRAVENOUS
  Administered 2022-01-09 (×2): 80 ug via INTRAVENOUS

## 2022-01-09 SURGICAL SUPPLY — 107 items
ADAPTER GOLDBERG URETERAL (ADAPTER) IMPLANT
ADH SKN CLS APL DERMABOND .7 (GAUZE/BANDAGES/DRESSINGS) ×1
ADPR CATH 15X14FR FL DRN BG (ADAPTER)
BAG COUNTER SPONGE SURGICOUNT (BAG) ×1 IMPLANT
BAG SPNG CNTER NS LX DISP (BAG)
BAG URO CATCHER STRL LF (MISCELLANEOUS) ×1 IMPLANT
BLADE EXTENDED COATED 6.5IN (ELECTRODE) IMPLANT
CANNULA REDUC XI 12-8 STAPL (CANNULA)
CANNULA REDUCER 12-8 DVNC XI (CANNULA) IMPLANT
CATH URETL OPEN END 6FR 70 (CATHETERS) IMPLANT
CELLS DAT CNTRL 66122 CELL SVR (MISCELLANEOUS) IMPLANT
CLOTH BEACON ORANGE TIMEOUT ST (SAFETY) ×1 IMPLANT
COVER SURGICAL LIGHT HANDLE (MISCELLANEOUS) ×2 IMPLANT
COVER TIP SHEARS 8 DVNC (MISCELLANEOUS) ×1 IMPLANT
COVER TIP SHEARS 8MM DA VINCI (MISCELLANEOUS) ×1
DERMABOND ADVANCED .7 DNX12 (GAUZE/BANDAGES/DRESSINGS) IMPLANT
DRAIN CHANNEL 19F RND (DRAIN) IMPLANT
DRAPE ARM DVNC X/XI (DISPOSABLE) ×4 IMPLANT
DRAPE COLUMN DVNC XI (DISPOSABLE) ×1 IMPLANT
DRAPE DA VINCI XI ARM (DISPOSABLE) ×4
DRAPE DA VINCI XI COLUMN (DISPOSABLE) ×1
DRAPE SURG IRRIG POUCH 19X23 (DRAPES) ×1 IMPLANT
DRSG OPSITE POSTOP 4X10 (GAUZE/BANDAGES/DRESSINGS) IMPLANT
DRSG OPSITE POSTOP 4X6 (GAUZE/BANDAGES/DRESSINGS) IMPLANT
DRSG OPSITE POSTOP 4X8 (GAUZE/BANDAGES/DRESSINGS) IMPLANT
ELECT PENCIL ROCKER SW 15FT (MISCELLANEOUS) ×1 IMPLANT
ELECT REM PT RETURN 15FT ADLT (MISCELLANEOUS) ×1 IMPLANT
ENDOLOOP SUT PDS II  0 18 (SUTURE)
ENDOLOOP SUT PDS II 0 18 (SUTURE) IMPLANT
EVACUATOR SILICONE 100CC (DRAIN) IMPLANT
GLOVE BIO SURGEON STRL SZ 6.5 (GLOVE) ×3 IMPLANT
GLOVE BIOGEL PI IND STRL 7.0 (GLOVE) ×2 IMPLANT
GLOVE INDICATOR 6.5 STRL GRN (GLOVE) ×1 IMPLANT
GLOVE SURG LX STRL 7.5 STRW (GLOVE) ×1 IMPLANT
GOWN SRG XL LVL 4 BRTHBL STRL (GOWNS) ×1 IMPLANT
GOWN STRL NON-REIN XL LVL4 (GOWNS) ×1
GOWN STRL REUS W/ TWL XL LVL3 (GOWN DISPOSABLE) ×3 IMPLANT
GOWN STRL REUS W/TWL XL LVL3 (GOWN DISPOSABLE) ×3
GRASPER SUT TROCAR 14GX15 (MISCELLANEOUS) IMPLANT
GUIDEWIRE ANG ZIPWIRE 038X150 (WIRE) IMPLANT
GUIDEWIRE STR DUAL SENSOR (WIRE) IMPLANT
HOLDER FOLEY CATH W/STRAP (MISCELLANEOUS) ×1 IMPLANT
IRRIG SUCT STRYKERFLOW 2 WTIP (MISCELLANEOUS) ×1
IRRIGATION SUCT STRKRFLW 2 WTP (MISCELLANEOUS) ×1 IMPLANT
KIT PROCEDURE DA VINCI SI (MISCELLANEOUS) ×1
KIT PROCEDURE DVNC SI (MISCELLANEOUS) ×1 IMPLANT
KIT TURNOVER KIT A (KITS) IMPLANT
MANIFOLD NEPTUNE II (INSTRUMENTS) ×1 IMPLANT
NDL INSUFFLATION 14GA 120MM (NEEDLE) ×1 IMPLANT
NEEDLE INSUFFLATION 14GA 120MM (NEEDLE) ×1 IMPLANT
PACK CARDIOVASCULAR III (CUSTOM PROCEDURE TRAY) ×1 IMPLANT
PACK COLON (CUSTOM PROCEDURE TRAY) ×1 IMPLANT
PACK CYSTO (CUSTOM PROCEDURE TRAY) ×1 IMPLANT
PAD POSITIONING PINK XL (MISCELLANEOUS) ×1 IMPLANT
RELOAD STAPLE 60 3.5 BLU DVNC (STAPLE) IMPLANT
RELOAD STAPLE 60 4.3 GRN DVNC (STAPLE) IMPLANT
RELOAD STAPLER 3.5X60 BLU DVNC (STAPLE) IMPLANT
RELOAD STAPLER 4.3X60 GRN DVNC (STAPLE) ×2 IMPLANT
RETRACTOR WND ALEXIS 18 MED (MISCELLANEOUS) IMPLANT
RTRCTR WOUND ALEXIS 18CM MED (MISCELLANEOUS)
SCISSORS LAP 5X35 DISP (ENDOMECHANICALS) IMPLANT
SEAL CANN UNIV 5-8 DVNC XI (MISCELLANEOUS) ×3 IMPLANT
SEAL XI 5MM-8MM UNIVERSAL (MISCELLANEOUS) ×3
SEALER VESSEL DA VINCI XI (MISCELLANEOUS) ×1
SEALER VESSEL EXT DVNC XI (MISCELLANEOUS) ×1 IMPLANT
SOLUTION ELECTROLUBE (MISCELLANEOUS) ×1 IMPLANT
SPIKE FLUID TRANSFER (MISCELLANEOUS) IMPLANT
STAPLER 60 DA VINCI SURE FORM (STAPLE)
STAPLER 60 SUREFORM DVNC (STAPLE) IMPLANT
STAPLER CANNULA SEAL DVNC XI (STAPLE) IMPLANT
STAPLER CANNULA SEAL XI (STAPLE)
STAPLER ECHELON POWER CIR 29 (STAPLE) IMPLANT
STAPLER ECHELON POWER CIR 31 (STAPLE) IMPLANT
STAPLER RELOAD 3.5X60 BLU DVNC (STAPLE)
STAPLER RELOAD 3.5X60 BLUE (STAPLE)
STAPLER RELOAD 4.3X60 GREEN (STAPLE) ×2
STAPLER RELOAD 4.3X60 GRN DVNC (STAPLE) ×2
STOPCOCK 4 WAY LG BORE MALE ST (IV SETS) ×2 IMPLANT
SUT ETHILON 2 0 PS N (SUTURE) IMPLANT
SUT NOVA NAB DX-16 0-1 5-0 T12 (SUTURE) ×2 IMPLANT
SUT PROLENE 2 0 KS (SUTURE) IMPLANT
SUT SILK 2 0 (SUTURE) ×1
SUT SILK 2 0 SH CR/8 (SUTURE) IMPLANT
SUT SILK 2-0 18XBRD TIE 12 (SUTURE) ×1 IMPLANT
SUT SILK 3 0 (SUTURE)
SUT SILK 3 0 SH CR/8 (SUTURE) ×1 IMPLANT
SUT SILK 3-0 18XBRD TIE 12 (SUTURE) IMPLANT
SUT V-LOC BARB 180 2/0GR6 GS22 (SUTURE)
SUT VIC AB 2-0 SH 18 (SUTURE) IMPLANT
SUT VIC AB 2-0 SH 27 (SUTURE)
SUT VIC AB 2-0 SH 27X BRD (SUTURE) IMPLANT
SUT VIC AB 3-0 SH 18 (SUTURE) IMPLANT
SUT VIC AB 4-0 PS2 27 (SUTURE) ×2 IMPLANT
SUT VICRYL 0 UR6 27IN ABS (SUTURE) ×1 IMPLANT
SUTURE V-LC BRB 180 2/0GR6GS22 (SUTURE) IMPLANT
SYR 10ML ECCENTRIC (SYRINGE) ×1 IMPLANT
SYS LAPSCP GELPORT 120MM (MISCELLANEOUS)
SYS WOUND ALEXIS 18CM MED (MISCELLANEOUS)
SYSTEM LAPSCP GELPORT 120MM (MISCELLANEOUS) IMPLANT
SYSTEM WOUND ALEXIS 18CM MED (MISCELLANEOUS) IMPLANT
TOWEL OR 17X26 10 PK STRL BLUE (TOWEL DISPOSABLE) IMPLANT
TOWEL OR NON WOVEN STRL DISP B (DISPOSABLE) ×1 IMPLANT
TRAY FOLEY MTR SLVR 16FR STAT (SET/KITS/TRAYS/PACK) ×1 IMPLANT
TROCAR ADV FIXATION 5X100MM (TROCAR) ×1 IMPLANT
TUBING CONNECTING 10 (TUBING) ×3 IMPLANT
TUBING INSUFFLATION 10FT LAP (TUBING) ×1 IMPLANT
TUBING UROLOGY SET (TUBING) IMPLANT

## 2022-01-09 NOTE — H&P (Signed)
PROVIDER:  Monico Blitz, MD   MRN: [id number] DOB: 10-06-1955    Subjective    Chief Complaint: No chief complaint on file.       History of Present Illness: Gabrielle Gonzalez is a 66 y.o. female who is seen today as an office consultation at the request of Dr. Pasty Arch for evaluation of No chief complaint on file. .  Patient presented to the hospital in May 2023 with persistent left lower quadrant pain for the past month.  She had been on and off antibiotics for this.  CT scan was performed which showed a pelvic abscess measuring 3.7 x 6 cm.  She underwent aspiration.  She has subsequently recovered.  Follow-up CT scan shows a small residual abscess.  Patient had a colonoscopy in February 2022.  She is scheduled for follow-up colonoscopy in 2025.       Review of Systems: A complete review of systems was obtained from the patient.  I have reviewed this information and discussed as appropriate with the patient.  See HPI as well for other ROS.       Medical History:     Past Medical History:  Diagnosis Date   Anxiety     Arthritis     GERD (gastroesophageal reflux disease)     Hyperlipidemia        There is no problem list on file for this patient.          Past Surgical History:  Procedure Laterality Date   CESAREAN SECTION       foot surgery               Allergies  Allergen Reactions   Erythromycin Nausea And Vomiting      Intolerant per pt   Etodolac Other (See Comments)      Intolerant per pt  GI burning    Intolerant per pt   GI burning   Penicillins Other (See Comments)      PATIENT HAS HAD ANCEF WITHOUT REACTIONS  > > > OK TO GIVE ANCEF WHEN ORDERED, PER MD < < <   09/04/16  Reaction unknown  PATIENT HAS HAD ANCEF WITHOUT REACTIONS  > > > OK TO GIVE ANCEF WHEN ORDERED, PER MD < < <   09/04/16    Has patient had a PCN reaction causing immediate rash, facial/tongue/throat swelling, SOB or lightheadedness with hypotension: Unknown  Has patient had a PCN  reaction causing severe rash involving mucus membranes or skin necrosis: Unknown  Has patient had a PCN reaction that required hospitalization: Unknown  Has patient had a PCN reaction occurring within the last 10 years: No  If all of the above an   PATIENT HAS HAD ANCEF WITHOUT REACTIONS  > > > OK TO GIVE ANCEF WHEN ORDERED, PER MD < < <   09/04/16   Reaction unknown            Current Outpatient Medications on File Prior to Visit  Medication Sig Dispense Refill   calcium polycarbophiL 500 mg Chew Take by mouth       cholecalciferol (VITAMIN D3) 1000 unit capsule Take by mouth       fenofibrate micronized (LOFIBRA) 200 MG capsule 200 mg daily with lunch.       magnesium 250 mg Tab Take by mouth       metroNIDAZOLE (FLAGYL) 500 MG tablet Take by mouth       omeprazole (PRILOSEC) 40 MG DR capsule Take 40 mg by  mouth       venlafaxine (EFFEXOR) 75 MG tablet Take 1 tablet by mouth once daily       vit C,E-Zn-coppr-lutein-zeaxan (OCUVITE LUTEIN AND ZEAXANTHIN) 60 mg-13.5 mg- 15 mg-2 mg-6 mg Cap Take 1 capsule by mouth every morning       VITAMIN B COMPLEX ORAL Take 1 capsule by mouth once daily        No current facility-administered medications on file prior to visit.           Family History  Problem Relation Age of Onset   High blood pressure (Hypertension) Mother     Skin cancer Father     Deep vein thrombosis (DVT or abnormal blood clot formation) Father     Colon cancer Father     High blood pressure (Hypertension) Sister     Hyperlipidemia (Elevated cholesterol) Sister        Social History       Tobacco Use  Smoking Status Never  Smokeless Tobacco Never      Social History        Socioeconomic History   Marital status: Married  Tobacco Use   Smoking status: Never   Smokeless tobacco: Never  Vaping Use   Vaping Use: Never used  Substance and Sexual Activity   Alcohol use: Never   Drug use: Never      Objective:      There were no vitals filed for this  visit.     Exam Gen: NAD CV: RRR Lungs: CTA Abd: soft       Labs, Imaging and Diagnostic Testing: CT scan reviewed pre and post aspiration.  Colonoscopy report reviewed as well.   Assessment and Plan:  There are no diagnoses linked to this encounter.   66 year old female with complicated diverticulitis due to pelvic abscess sitting on the rectal wall.  We discussed the complications associated with this including damage to the rectum requiring low rectal anastomosis and diverting ileostomy.  We discussed the possibility of ureteral injury and the need for firefly identification prior to surgery.  We discussed the risk of leak associated with a colorectal anastomosis.  We would plan on performing a low anterior resection.  If we were unable to save the proximal rectum, she would need a temporary diverting ileostomy and would have a significant change in bowel habits afterwards.   The surgery and anatomy were described to the patient as well as the risks of surgery and the possible complications.  These include: Bleeding, deep abdominal infections and possible wound complications such as hernia and infection, damage to adjacent structures, leak of surgical connections, which can lead to other surgeries and possibly an ostomy, possible need for other procedures, such as abscess drains in radiology, possible prolonged hospital stay, possible diarrhea from removal of part of the colon, possible constipation from narcotics, possible bowel, bladder or sexual dysfunction if having rectal surgery, prolonged fatigue/weakness or appetite loss, possible early recurrence of of disease, possible complications of their medical problems such as heart disease or arrhythmias or lung problems, death (less than 1%). I believe the patient understands and wishes to proceed with the surgery.      Gabrielle Adie, MD Colon and Rectal Surgery [facility] Surgery

## 2022-01-09 NOTE — H&P (Signed)
H&P  Chief Complaint: History of diverticulitis  History of Present Illness: Gabrielle Gonzalez is a 66 y.o. year old female presenting for low anterior resection by Dr. Marcello Moores for diverticulitis.  I have been consulted to place firefly to help identify ureters prior to the procedure.  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     B Complex Vitamins (VITAMIN B COMPLEX PO) Take 1 capsule by mouth every morning.     Cholecalciferol (VITAMIN D) 50 MCG (2000 UT) tablet Take 2,000 Units by mouth daily.     fenofibrate micronized (LOFIBRA) 200 MG capsule Take 200 mg by mouth daily with lunch.     Magnesium 400 MG TABS Take 400 mg by mouth daily.     metoprolol succinate (TOPROL-XL) 25 MG 24 hr tablet Take 1 tablet (25 mg  total) by mouth daily. Take with or immediately following a meal. 90 tablet 3   Multiple Vitamins-Minerals (ALGAE BASED CALCIUM) TABS Take 2 tablets by mouth daily.     Multiple Vitamins-Minerals (OCUVITE ADULT 50+) CAPS Take 1 capsule by mouth daily.     omeprazole (PRILOSEC OTC) 20 MG tablet Take 20 mg by mouth daily as needed (acid reflux).     polyethylene glycol (MIRALAX / GLYCOLAX) 17 g packet Take 17 g by mouth daily as needed for moderate constipation.     tretinoin (RETIN-A) 0.1 % cream Apply 1 application  topically daily as needed (rosacea).     venlafaxine (EFFEXOR) 75 MG tablet Take 75 mg by mouth every morning.      Allergies:  Allergies  Allergen Reactions   Erythromycin Nausea And Vomiting   Lodine [Etodolac] Other (See Comments)    GI burning     Family History  Problem Relation Age of Onset   Prostate cancer Father    Diabetes Father    Colon cancer Father    Liver cancer Father        mets from colon   Hypertension Mother    Other Mother        Carcinoid tumor of intestine   Colon cancer Mother    Hypertension Sister    Breast cancer Neg Hx    Colon polyps Neg Hx  Esophageal cancer Neg Hx    Rectal cancer Neg Hx    Stomach cancer Neg Hx     Social History:  reports that she has never smoked. She has never used smokeless tobacco. She reports current alcohol use. She reports that she does not use drugs.  ROS: A complete review of systems was performed.  All systems are negative except for pertinent findings as noted.  Physical Exam:  Vital signs in last 24 hours: Temp:  [97.6 F (36.4 C)] 97.6 F (36.4 C) (11/02 0537) Pulse Rate:  [57] 57 (11/02 0537) Resp:  [18] 18 (11/02 0537) BP: (153)/(90) 153/90 (11/02 0537) SpO2:  [99 %] 99 % (11/02 0537) Weight:  [59.9 kg] 59.9 kg (11/02 0610) General:  Alert and oriented, No acute distress HEENT: Normocephalic, atraumatic Neck: No JVD or lymphadenopathy Cardiovascular: Regular rate  Lungs: Normal  inspiratory/expiratory excursion Neurologic: Grossly intact    Impression/Assessment:  Diverticular disease  Plan:  Cystoscopy, intraureteral injection of firefly  Gabrielle Gonzalez 01/09/2022, 7:31 AM  Gabrielle Boxer. Chantrell Apsey MD

## 2022-01-09 NOTE — Anesthesia Postprocedure Evaluation (Signed)
Anesthesia Post Note  Patient: Gabrielle Gonzalez  Procedure(s) Performed: XI ROBOTIC ASSISTED LOW ANTERIOR RESECTION INTRAOPERATIVE ASSESSMENT OF VASCULAR PERFUSION CYSTOSCOPY with FIREFLY INJECTION     Patient location during evaluation: PACU Anesthesia Type: General Level of consciousness: awake Pain management: pain level controlled Vital Signs Assessment: post-procedure vital signs reviewed and stable Respiratory status: spontaneous breathing, nonlabored ventilation, respiratory function stable and patient connected to nasal cannula oxygen Cardiovascular status: blood pressure returned to baseline and stable Postop Assessment: no apparent nausea or vomiting Anesthetic complications: no  No notable events documented.  Last Vitals:  Vitals:   01/09/22 1501 01/09/22 1555  BP: 112/73 107/77  Pulse: 69 (!) 57  Resp: 17 17  Temp: 36.4 C 36.6 C  SpO2: 100% 98%    Last Pain:  Vitals:   01/09/22 1555  TempSrc: Oral  PainSc:                  Yoshiharu Brassell P Krithik Mapel

## 2022-01-09 NOTE — Consult Note (Signed)
[place] Nurse ostomy follow up Pt went to the OR today and did not receive an ostomy. Surgical team following for assessment and plan of care. Please re-consult if further assistance is needed.  Thank-you,  Julien Girt MSN, [place], [place], [place], [place]

## 2022-01-09 NOTE — Anesthesia Procedure Notes (Signed)
Procedure Name: Intubation Date/Time: 01/09/2022 8:36 AM  Performed by: Claudia Desanctis, CRNAPre-anesthesia Checklist: Patient identified, Emergency Drugs available, Suction available and Patient being monitored Patient Re-evaluated:Patient Re-evaluated prior to induction Oxygen Delivery Method: Circle system utilized Preoxygenation: Pre-oxygenation with 100% oxygen Induction Type: IV induction Ventilation: Mask ventilation without difficulty Laryngoscope Size: Miller and 3 Grade View: Grade I Tube type: Oral Tube size: 7.0 mm Number of attempts: 1 Airway Equipment and Method: Stylet Placement Confirmation: ETT inserted through vocal cords under direct vision, positive ETCO2 and breath sounds checked- equal and bilateral Secured at: 22 cm Tube secured with: Tape Dental Injury: Teeth and Oropharynx as per pre-operative assessment

## 2022-01-09 NOTE — Op Note (Signed)
01/09/2022  10:27 AM  PATIENT:  Gabrielle Gonzalez  66 y.o. female  Patient Care Team: Berkley Harvey, NP as PCP - General (Nurse Practitioner) Buford Dresser, MD as PCP - Cardiology (Cardiology)  PRE-OPERATIVE DIAGNOSIS:  DIVERTICULAR DISEASE  POST-OPERATIVE DIAGNOSIS:  DIVERTICULAR DISEASE  PROCEDURE:  XI ROBOTIC ASSISTED LOW ANTERIOR RESECTION INTRAOPERATIVE ASSESSMENT OF VASCULAR PERFUSION CYSTOSCOPY with FIREFLY INJECTION   Surgeon(s): Franchot Gallo, MD Michael Boston, MD Leighton Ruff, MD  ASSISTANT: Dr Johney Maine   ANESTHESIA:   local and general  EBL: 152m Total I/O In: 1100 [I.V.:1000; IV Piggyback:100] Out: -   Delay start of Pharmacological VTE agent (>24hrs) due to surgical blood loss or risk of bleeding:  no  DRAINS: none   SPECIMEN:  Rectosigmoid  DISPOSITION OF SPECIMEN:  PATHOLOGY  COUNTS:  YES  PLAN OF CARE: Admit to inpatient   PATIENT DISPOSITION:  PACU - hemodynamically stable.  INDICATION:    66y.o. F with complicated diverticular disease.  I recommended segmental resection:  The anatomy & physiology of the digestive tract was discussed.  The pathophysiology was discussed.  Natural history risks without surgery was discussed.   I worked to give an overview of the disease and the frequent need to have multispecialty involvement.  I feel the risks of no intervention will lead to serious problems that outweigh the operative risks; therefore, I recommended a partial colectomy to remove the pathology.  Laparoscopic & open techniques were discussed.   Risks such as bleeding, infection, abscess, leak, reoperation, possible ostomy, hernia, heart attack, death, and other risks were discussed.  I noted a good likelihood this will help address the problem.   Goals of post-operative recovery were discussed as well.    The patient expressed understanding & wished to proceed with surgery.  OR FINDINGS:   Patient had adhesions in the left pelvis  involving the small bowel, sigmoid colon and rectum  The anastomosis rests 10 cm from the anal verge by rigid proctoscopy.  DESCRIPTION:   Informed consent was confirmed.  The patient underwent general anaesthesia without difficulty.  The patient was positioned appropriately.  VTE prevention in place.  The patient's abdomen was clipped, prepped, & draped in a sterile fashion.  Surgical timeout confirmed our plan.  The patient was positioned in reverse Trendelenburg.  Abdominal entry was gained using a Varies needle in the LUQ.  Entry was clean.  I induced carbon dioxide insufflation.  An 867mrobotic port was placed in the RUQ.  Camera inspection revealed no injury.  Extra ports were carefully placed under direct laparoscopic visualization.  I laparoscopically reflected the greater omentum and the upper abdomen the small bowel in the upper abdomen. The patient was appropriately positioned and the robot was docked to the patient's left side.  Instruments were placed under direct visualization. There were some adhesions of small bowel to the colon, which were taken down sharply.     I mobilized the sigmoid colon off of the pelvic sidewall.  This extended deep into the pelvis on the left side. I then freed the sigmoid colon off the rectum and straightened out the specimen.  I scored the base of peritoneum of the right side of the mesentery of the left colon from the ligament of Treitz to the peritoneal reflection of the mid rectum.  I elevated the sigmoid mesentery and enetered into the retro-mesenteric plane. We were able to identify the left ureter and gonadal vessels. We kept those posterior within the retroperitoneum and elevated  the left colon mesentery off that. I did isolated IMA pedicle but did not ligate it yet.  I continued distally and got into the avascular plane posterior to the mesorectum. This allowed me to help mobilize the rectum as well by freeing the mesorectum off the sacrum.  I mobilized  the peritoneal coverings towards the peritoneal reflection on both the right and left sides of the rectum.  I could see the right and left ureters and stayed away from them.    I skeletonized the inferior mesenteric artery pedicle.  I went down to its takeoff from the aorta.  I isolated the inferior mesenteric vein off of the ligament of Treitz just cephalad to that as well.  After confirming the left ureter was out of the way, I went ahead and ligated the inferior mesenteric artery pedicle with bipolar robotic vessel sealer well above its takeoff from the aorta.   I did ligate the inferior mesenteric vein in a similar fashion.  We ensured hemostasis. I skeletonized the mesorectum at the junction at the proximal rectum using blunt dissection & bipolar robotic vessel sealer.  I mobilized the left colon in a lateral to medial fashion off the line of Toldt up towards the splenic flexure to ensure good mobilization of the left colon to reach into the pelvis.  I divided the mesentery of the sigmoid colon up to the level of the sigmoid descending colon junction.  Once this was complete we injected 3 mL of firefly intravenously to check for perfusion.  There was good perfusion noted of the sigmoid colon up until the level of dissection and good perfusion of the remaining rectum as well.  After this the rectum was transected using a 60 mm green load stapler x2.  We then removed the robotic instruments and robot from the patient.  The 12 mm suprapubic port was enlarged into a Pfannenstiel incision and an [place] wound protector was placed.  The colon was brought out of this and divided proximally over a pursestring device and a 2-0 Prolene pursestring suture.  2-0 Prolene was secured with silk sutures and a 29 mm EEA anvil was placed.  Pursestring was tied tightly around this.  The fat was cleared around the pursestring suture to allow for good apposition of the staple tissues.  This was then placed back into the pelvis.   An EEA stapler was inserted into the rectum and brought out through the stump and.  An end-to-end colorectal anastomosis was performed using the EEA stapler.  There was no tension noted on the anastomosis.  There was no leak when tested with insufflation under irrigation.  Hemostasis was good.  We then switched to clean gowns, gloves, instruments and drapes.  The Pfannenstiel peritoneum was closed using a running 0 Vicryl suture.  The fascia was closed using a running #1 Novafil suture x2.  Subcutaneous tissue was reapproximated using a running 2-0 Vicryl suture and the skin was closed using a running 4-0 Vicryl suture.  A sterile dressing was applied.  The remaining port sites were closed using 4-0 Vicryl subcuticular sutures and Dermabond.  Patient was then awakened from anesthesia and sent to the postanesthesia care unit in stable condition.  All counts were correct per operating room staff.  An MD assistant was necessary for tissue manipulation, retraction and positioning due to the complexity of the case and hospital policies   [name], MD  [facility]

## 2022-01-09 NOTE — Transfer of Care (Signed)
Immediate Anesthesia Transfer of Care Note  Patient: Gabrielle Gonzalez  Procedure(s) Performed: XI ROBOTIC ASSISTED LOW ANTERIOR RESECTION INTRAOPERATIVE ASSESSMENT OF VASCULAR PERFUSION CYSTOSCOPY with FIREFLY INJECTION  Patient Location: PACU  Anesthesia Type:General  Level of Consciousness: drowsy  Airway & Oxygen Therapy: Patient Spontanous Breathing and Patient connected to face mask  Post-op Assessment: Report given to RN and Post -op Vital signs reviewed and stable  Post vital signs: Reviewed and stable  Last Vitals:  Vitals Value Taken Time  BP 156/84 01/09/22 1041  Temp    Pulse 58 01/09/22 1045  Resp 20 01/09/22 1045  SpO2 98 % 01/09/22 1045  Vitals shown include unvalidated device data.  Last Pain:  Vitals:   01/09/22 0537  TempSrc: Oral         Complications: No notable events documented.

## 2022-01-10 ENCOUNTER — Encounter (HOSPITAL_COMMUNITY): Payer: Self-pay | Admitting: General Surgery

## 2022-01-10 LAB — CBC
HCT: 32.4 % — ABNORMAL LOW (ref 36.0–46.0)
Hemoglobin: 10.5 g/dL — ABNORMAL LOW (ref 12.0–15.0)
MCH: 29.3 pg (ref 26.0–34.0)
MCHC: 32.4 g/dL (ref 30.0–36.0)
MCV: 90.5 fL (ref 80.0–100.0)
Platelets: 234 10*3/uL (ref 150–400)
RBC: 3.58 MIL/uL — ABNORMAL LOW (ref 3.87–5.11)
RDW: 13.6 % (ref 11.5–15.5)
WBC: 10.4 10*3/uL (ref 4.0–10.5)
nRBC: 0 % (ref 0.0–0.2)

## 2022-01-10 LAB — BASIC METABOLIC PANEL
Anion gap: 8 (ref 5–15)
BUN: 7 mg/dL — ABNORMAL LOW (ref 8–23)
CO2: 24 mmol/L (ref 22–32)
Calcium: 8.6 mg/dL — ABNORMAL LOW (ref 8.9–10.3)
Chloride: 103 mmol/L (ref 98–111)
Creatinine, Ser: 0.82 mg/dL (ref 0.44–1.00)
GFR, Estimated: >60 mL/min (ref >60)
Glucose, Bld: 128 mg/dL — ABNORMAL HIGH (ref 70–99)
Potassium: 3.8 mmol/L (ref 3.5–5.1)
Sodium: 135 mmol/L (ref 135–145)

## 2022-01-10 MED ORDER — TRAMADOL HCL 50 MG PO TABS
50.0000 mg | ORAL_TABLET | Freq: Four times a day (QID) | ORAL | Status: DC | PRN
  Administered 2022-01-10: 100 mg via ORAL
  Filled 2022-01-10: qty 2

## 2022-01-10 MED ORDER — ACETAMINOPHEN 500 MG PO TABS
1000.0000 mg | ORAL_TABLET | Freq: Four times a day (QID) | ORAL | Status: DC
  Administered 2022-01-10 – 2022-01-11 (×4): 1000 mg via ORAL
  Filled 2022-01-10 (×3): qty 2

## 2022-01-10 NOTE — Progress Notes (Signed)
1 Day Post-Op Robotic LAR Subjective: Having bowel function, tolerating liquids, no nausea, c/o headaches  Objective: Vital signs in last 24 hours: Temp:  [97.4 F (36.3 C)-99.2 F (37.3 C)] 98.9 F (37.2 C) (11/03 0559) Pulse Rate:  [54-76] 63 (11/03 0559) Resp:  [10-21] 17 (11/03 0559) BP: (88-156)/(65-90) 112/71 (11/03 0559) SpO2:  [93 %-100 %] 94 % (11/03 0559) Weight:  [59.7 kg] 59.7 kg (11/03 0500)   Intake/Output from previous day: 11/02 0701 - 11/03 0700 In: 3363.5 [P.O.:360; I.V.:2903.5; IV Piggyback:100] Out: 3670 [Urine:3650; Blood:20] Intake/Output this shift: No intake/output data recorded.   General appearance: alert and cooperative GI: normal findings: soft  Incision: no significant drainage  Lab Results:  Recent Labs    01/10/22 0437  WBC 10.4  HGB 10.5*  HCT 32.4*  PLT 234   BMET Recent Labs    01/10/22 0437  NA 135  K 3.8  CL 103  CO2 24  GLUCOSE 128*  BUN 7*  CREATININE 0.82  CALCIUM 8.6*   PT/INR No results for input(s): "LABPROT", "INR" in the last 72 hours. ABG No results for input(s): "PHART", "HCO3" in the last 72 hours.  Invalid input(s): "PCO2", "PO2"  MEDS, Scheduled  acetaminophen  1,000 mg Oral Q6H   alvimopan  12 mg Oral BID   enoxaparin (LOVENOX) injection  40 mg Subcutaneous Q24H   feeding supplement  237 mL Oral BID BM   fenofibrate  160 mg Oral Q lunch   gabapentin  300 mg Oral BID   magnesium oxide  400 mg Oral Daily   metoprolol succinate  25 mg Oral Daily   saccharomyces boulardii  250 mg Oral BID   venlafaxine XR  75 mg Oral Q breakfast    Studies/Results: No results found.  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   Essential hypertension 04/09/2021   Age-related osteoporosis without current pathological fracture 12/23/2018   GERD without esophagitis 05/25/2017   Perennial allergic rhinitis with seasonal variation 05/25/2017   Proximal humerus fracture 09/04/2016   Lumbar facet joint syndrome 10/30/2014   Hyperglycemia 03/01/2014   IFG (impaired fasting glucose) 03/01/2014   Urinary incontinence    Rosacea    Adjustment disorder with anxiety 02/20/2011   Hyperlipidemia 11/29/2010   Anal fissure 08/15/2010   Disorder of bone and cartilage 04/30/2010   ANXIETY 05/21/2007   PANIC DISORDER 05/21/2007   ANAL AND RECTAL POLYP 05/21/2007   COLONIC POLYPS 09/24/2006   Diaphragmatic hernia 09/24/2006   DIVERTICULOSIS, COLON 09/24/2006    Expected post op course  Plan: d/c foley Advance diet to soft  SL IVF Ambulate in hall PO pain meds   LOS: 1 day     .[name], [facility], [place]    01/10/2022 7:50 AM

## 2022-01-10 NOTE — Progress Notes (Signed)
Pharmacy Brief Note - Alvimopan (Entereg)  The standing order set for alvimopan (Entereg) now includes an automatic order to discontinue the drug after the patient has had a bowel movement.  The change was approved by the [facility] and the Medical Executive Committee.    This patient has had a bowel movement documented by nursing.  Therefore, alvimopan has been discontinued.  If there are questions, please contact the pharmacy at [phone].  Thank you  Gretta Arab PharmD, BCPS [facility] main pharmacy [phone] 01/10/2022 2:12 PM

## 2022-01-10 NOTE — Progress Notes (Signed)
.  Mobility Specialist - Progress Note   01/10/22 1031  Mobility  Activity Ambulated independently in hallway  Level of Assistance Independent  Distance Ambulated (ft) 500 ft  Range of Motion/Exercises Active  Activity Response Tolerated well  Mobility Referral Yes  $Mobility charge 1 Mobility   Pt received in bed and agreeable to mobility. C/o should pain towards the back. Nurse notified. Pt to bed after session with all needs met.     [facility]

## 2022-01-10 NOTE — Progress Notes (Signed)
Mobility Specialist - Progress Note   01/10/22 1403  Mobility  Activity Ambulated independently in hallway  Level of Assistance Independent  Assistive Device None  Distance Ambulated (ft) 500 ft  Activity Response Tolerated well  Mobility Referral Yes  $Mobility charge 1 Mobility   Pt received in bed and agreeable to mobility. C/o shoulder pain during mobility. Pt to bed after session with all needs met.     [facility]

## 2022-01-10 NOTE — Progress Notes (Signed)
  Transition of Care [facility]) Screening Note   Patient Details  Name: Gabrielle Gonzalez Date of Birth: 1955-07-28   Transition of Care [facility]) CM/SW Contact:    Vassie Moselle, [place] Phone Number: 01/10/2022, 10:48 AM    Transition of Care Department [facility]) has reviewed patient and no TOC needs have been identified at this time. We will continue to monitor patient advancement through interdisciplinary progression rounds. If new patient transition needs arise, please place a TOC consult.

## 2022-01-11 LAB — CBC
HCT: 35 % — ABNORMAL LOW (ref 36.0–46.0)
Hemoglobin: 11 g/dL — ABNORMAL LOW (ref 12.0–15.0)
MCH: 29 pg (ref 26.0–34.0)
MCHC: 31.4 g/dL (ref 30.0–36.0)
MCV: 92.3 fL (ref 80.0–100.0)
Platelets: 243 10*3/uL (ref 150–400)
RBC: 3.79 MIL/uL — ABNORMAL LOW (ref 3.87–5.11)
RDW: 13.4 % (ref 11.5–15.5)
WBC: 10.2 10*3/uL (ref 4.0–10.5)
nRBC: 0 % (ref 0.0–0.2)

## 2022-01-11 LAB — BASIC METABOLIC PANEL
Anion gap: 5 (ref 5–15)
BUN: 8 mg/dL (ref 8–23)
CO2: 29 mmol/L (ref 22–32)
Calcium: 9 mg/dL (ref 8.9–10.3)
Chloride: 102 mmol/L (ref 98–111)
Creatinine, Ser: 0.88 mg/dL (ref 0.44–1.00)
GFR, Estimated: >60 mL/min (ref >60)
Glucose, Bld: 111 mg/dL — ABNORMAL HIGH (ref 70–99)
Potassium: 3.8 mmol/L (ref 3.5–5.1)
Sodium: 136 mmol/L (ref 135–145)

## 2022-01-11 MED ORDER — TRAMADOL HCL 50 MG PO TABS: 50.0000 mg | ORAL_TABLET | Freq: Four times a day (QID) | ORAL | 0 refills | Status: DC | PRN

## 2022-01-11 NOTE — Progress Notes (Signed)
Assessment unchanged. Pt verbalized understanding of dc instructions through teach back including medications and follow up care. Discharged via wc to front entrance by NT.

## 2022-01-11 NOTE — Discharge Summary (Signed)
Physician Discharge Summary  Patient ID: Gabrielle Gonzalez MRN: [id number] DOB/AGE: 64-Mar-1957 64 y.o.  Admit date: 01/09/2022 Discharge date: 01/11/2022  Admission Diagnoses: diverticular disease  Discharge Diagnoses:  Principal Problem:   Diverticular disease   Discharged Condition: good  Hospital Course: Patient was admitted to the med surg floor after surgery.  Diet was advanced as tolerated.  Patient began to have bowel function on postop day 1.  By postop day 2, she was tolerating a solid diet and pain was controlled with oral medications.  She was urinating without difficulty and ambulating without assistance.  Patient was felt to be in stable condition for discharge to home.   Consults: None  Significant Diagnostic Studies: labs: cbc. bmet  Treatments: IV hydration, analgesia: acetaminophen, and surgery: robotic LAR  Discharge Exam: Blood pressure 98/70, pulse 69, temperature 98.5 F (36.9 C), temperature source Oral, resp. rate 16, height 5' 1.5" (1.562 m), weight 59.7 kg, last menstrual period 06/18/2011, SpO2 (!) 89 %. General appearance: alert and cooperative GI: soft, nondistended Incision/Wound: c/d/i  Disposition: Discharge disposition: 01-Home or Self Care        Allergies as of 01/11/2022       Reactions   Erythromycin Nausea And Vomiting   Lodine [etodolac] Other (See Comments)   GI burning         Medication List     TAKE these medications    acetaminophen 500 MG tablet Commonly known as: TYLENOL Take 1,000-1,500 mg by mouth 2 (two) times daily as needed (pain).   fenofibrate micronized 200 MG capsule Commonly known as: LOFIBRA Take 200 mg by mouth daily with lunch.   Magnesium 400 MG Tabs Take 400 mg by mouth daily.   metoprolol succinate 25 MG 24 hr tablet Commonly known as: TOPROL-XL Take 1 tablet (25 mg total) by mouth daily. Take with or immediately following a meal.   Ocuvite Adult 50+ Caps Take 1 capsule by mouth daily.    Algae Based Calcium Tabs Take 2 tablets by mouth daily.   omeprazole 20 MG tablet Commonly known as: PRILOSEC OTC Take 20 mg by mouth daily as needed (acid reflux).   polyethylene glycol 17 g packet Commonly known as: MIRALAX / GLYCOLAX Take 17 g by mouth daily as needed for moderate constipation.   traMADol 50 MG tablet Commonly known as: ULTRAM Take 1-2 tablets (50-100 mg total) by mouth every 6 (six) hours as needed for moderate pain or severe pain.   tretinoin 0.1 % cream Commonly known as: RETIN-A Apply 1 application  topically daily as needed (rosacea).   venlafaxine 75 MG tablet Commonly known as: EFFEXOR Take 75 mg by mouth every morning.   VITAMIN B COMPLEX PO Take 1 capsule by mouth every morning.   Vitamin D 50 MCG (2000 UT) tablet Take 2,000 Units by mouth daily.         Signed: [name] 64/11/7987, 8:58 AM

## 2022-01-11 NOTE — Plan of Care (Signed)
Problem: Education: Goal: Understanding of discharge needs will improve Outcome: Adequate for Discharge Goal: Verbalization of understanding of the causes of altered bowel function will improve Outcome: Adequate for Discharge   Problem: Activity: Goal: Ability to tolerate increased activity will improve Outcome: Adequate for Discharge   Problem: Bowel/Gastric: Goal: Gastrointestinal status for postoperative course will improve Outcome: Adequate for Discharge   Problem: Health Behavior/Discharge Planning: Goal: Identification of community resources to assist with postoperative recovery needs will improve Outcome: Adequate for Discharge   Problem: Nutritional: Goal: Will attain and maintain optimal nutritional status will improve Outcome: Adequate for Discharge   Problem: Clinical Measurements: Goal: Postoperative complications will be avoided or minimized Outcome: Adequate for Discharge   Problem: Respiratory: Goal: Respiratory status will improve Outcome: Adequate for Discharge   Problem: Skin Integrity: Goal: Will show signs of wound healing Outcome: Adequate for Discharge   Problem: Education: Goal: Knowledge of General Education information will improve Description: Including pain rating scale, medication(s)/side effects and non-pharmacologic comfort measures Outcome: Adequate for Discharge   Problem: Health Behavior/Discharge Planning: Goal: Ability to manage health-related needs will improve Outcome: Adequate for Discharge   Problem: Clinical Measurements: Goal: Ability to maintain clinical measurements within normal limits will improve Outcome: Adequate for Discharge Goal: Will remain free from infection Outcome: Adequate for Discharge Goal: Diagnostic test results will improve Outcome: Adequate for Discharge Goal: Respiratory complications will improve Outcome: Adequate for Discharge Goal: Cardiovascular complication will be avoided Outcome: Adequate for  Discharge   Problem: Activity: Goal: Risk for activity intolerance will decrease Outcome: Adequate for Discharge   Problem: Nutrition: Goal: Adequate nutrition will be maintained Outcome: Adequate for Discharge   Problem: Coping: Goal: Level of anxiety will decrease Outcome: Adequate for Discharge   Problem: Elimination: Goal: Will not experience complications related to bowel motility Outcome: Adequate for Discharge Goal: Will not experience complications related to urinary retention Outcome: Adequate for Discharge   Problem: Pain Managment: Goal: General experience of comfort will improve Outcome: Adequate for Discharge   Problem: Safety: Goal: Ability to remain free from injury will improve Outcome: Adequate for Discharge   Problem: Skin Integrity: Goal: Risk for impaired skin integrity will decrease Outcome: Adequate for Discharge   Problem: Education: Goal: Knowledge of General Education information will improve Description: Including pain rating scale, medication(s)/side effects and non-pharmacologic comfort measures Outcome: Adequate for Discharge   Problem: Health Behavior/Discharge Planning: Goal: Ability to manage health-related needs will improve Outcome: Adequate for Discharge   Problem: Clinical Measurements: Goal: Ability to maintain clinical measurements within normal limits will improve Outcome: Adequate for Discharge Goal: Will remain free from infection Outcome: Adequate for Discharge Goal: Diagnostic test results will improve Outcome: Adequate for Discharge Goal: Respiratory complications will improve Outcome: Adequate for Discharge Goal: Cardiovascular complication will be avoided Outcome: Adequate for Discharge   Problem: Activity: Goal: Risk for activity intolerance will decrease Outcome: Adequate for Discharge   Problem: Nutrition: Goal: Adequate nutrition will be maintained Outcome: Adequate for Discharge   Problem: Coping: Goal:  Level of anxiety will decrease Outcome: Adequate for Discharge   Problem: Elimination: Goal: Will not experience complications related to bowel motility Outcome: Adequate for Discharge Goal: Will not experience complications related to urinary retention Outcome: Adequate for Discharge   Problem: Pain Managment: Goal: General experience of comfort will improve Outcome: Adequate for Discharge   Problem: Safety: Goal: Ability to remain free from injury will improve Outcome: Adequate for Discharge   Problem: Skin Integrity: Goal: Risk for impaired skin integrity will  decrease Outcome: Adequate for Discharge

## 2022-01-11 NOTE — Discharge Instructions (Signed)
SURGERY: POST OP INSTRUCTIONS (Surgery for small bowel obstruction, colon resection, etc)   ######################################################################  EAT Gradually transition to a high fiber diet with a fiber supplement over the next few days after discharge  WALK Walk an hour a day.  Control your pain to do that.    CONTROL PAIN Control pain so that you can walk, sleep, tolerate sneezing/coughing, go up/down stairs.  HAVE A BOWEL MOVEMENT DAILY Keep your bowels regular to avoid problems.  OK to try a laxative to override constipation.  OK to use an antidairrheal to slow down diarrhea.  Call if not better after 2 tries  CALL IF YOU HAVE PROBLEMS/CONCERNS Call if you are still struggling despite following these instructions. Call if you have concerns not answered by these instructions  ######################################################################   DIET Follow a light diet the first few days at home.  Start with a bland diet such as soups, liquids, starchy foods, low fat foods, etc.  If you feel full, bloated, or constipated, stay on a ful liquid or pureed/blenderized diet for a few days until you feel better and no longer constipated. Be sure to drink plenty of fluids every day to avoid getting dehydrated (feeling dizzy, not urinating, etc.). Gradually add a fiber supplement to your diet over the next week.  Gradually get back to a regular solid diet.  Avoid fast food or heavy meals the first week as you are more likely to get nauseated. It is expected for your digestive tract to need a few months to get back to normal.  It is common for your bowel movements and stools to be irregular.  You will have occasional bloating and cramping that should eventually fade away.  Until you are eating solid food normally, off all pain medications, and back to regular activities; your bowels will not be normal. Focus on eating a low-fat, high fiber diet the rest of your life  (See Getting to Good Bowel Health, below).  CARE of your INCISION or WOUND  It is good for closed incisions and even open wounds to be washed every day.  Shower every day.  Short baths are fine.  Wash the incisions and wounds clean with soap & water.    You may leave closed incisions open to air if it is dry.   You may cover the incision with clean gauze & replace it after your daily shower for comfort.  STAPLES: You have skin staples.  Leave them in place & set up an appointment for them to be removed by a surgery office nurse ~10 days after surgery. = 1st week of January 2024    ACTIVITIES as tolerated Start light daily activities --- self-care, walking, climbing stairs-- beginning the day after surgery.  Gradually increase activities as tolerated.  Control your pain to be active.  Stop when you are tired.  Ideally, walk several times a day, eventually an hour a day.   Most people are back to most day-to-day activities in a few weeks.  It takes 4-8 weeks to get back to unrestricted, intense activity. If you can walk 30 minutes without difficulty, it is safe to try more intense activity such as jogging, treadmill, bicycling, low-impact aerobics, swimming, etc. Save the most intensive and strenuous activity for last (Usually 4-8 weeks after surgery) such as sit-ups, heavy lifting, contact sports, etc.  Refrain from any intense heavy lifting or straining until you are off narcotics for pain control.  You will have off days, but things should improve   week-by-week. DO NOT PUSH THROUGH PAIN.  Let pain be your guide: If it hurts to do something, don't do it.  Pain is your body warning you to avoid that activity for another week until the pain goes down. You may drive when you are no longer taking narcotic prescription pain medication, you can comfortably wear a seatbelt, and you can safely make sudden turns/stops to protect yourself without hesitating due to pain. You may have sexual intercourse when it  is comfortable. If it hurts to do something, stop.  MEDICATIONS Take your usually prescribed home medications unless otherwise directed.   Blood thinners:  Usually you can restart any strong blood thinners after the second postoperative day.  It is OK to take aspirin right away.     If you are on strong blood thinners (warfarin/Coumadin, Plavix, Xerelto, Eliquis, Pradaxa, etc), discuss with your surgeon, medicine PCP, and/or cardiologist for instructions on when to restart the blood thinner & if blood monitoring is needed (PT/INR blood check, etc).     PAIN CONTROL Pain after surgery or related to activity is often due to strain/injury to muscle, tendon, nerves and/or incisions.  This pain is usually short-term and will improve in a few months.  To help speed the process of healing and to get back to regular activity more quickly, DO THE FOLLOWING THINGS TOGETHER: Increase activity gradually.  DO NOT PUSH THROUGH PAIN Use Ice and/or Heat Try Gentle Massage and/or Stretching Take over the counter pain medication Take Narcotic prescription pain medication for more severe pain  Good pain control = faster recovery.  It is better to take more medicine to be more active than to stay in bed all day to avoid medications.  Increase activity gradually Avoid heavy lifting at first, then increase to lifting as tolerated over the next 6 weeks. Do not "push through" the pain.  Listen to your body and avoid positions and maneuvers than reproduce the pain.  Wait a few days before trying something more intense Walking an hour a day is encouraged to help your body recover faster and more safely.  Start slowly and stop when getting sore.  If you can walk 30 minutes without stopping or pain, you can try more intense activity (running, jogging, aerobics, cycling, swimming, treadmill, sex, sports, weightlifting, etc.) Remember: If it hurts to do it, then don't do it! Use Ice and/or Heat You will have swelling and  bruising around the incisions.  This will take several weeks to resolve. Ice packs or heating pads (6-8 times a day, 30-60 minutes at a time) will help sooth soreness & bruising. Some people prefer to use ice alone, heat alone, or alternate between ice & heat.  Experiment and see what works best for you.  Consider trying ice for the first few days to help decrease swelling and bruising; then, switch to heat to help relax sore spots and speed recovery. Shower every day.  Short baths are fine.  It feels good!  Keep the incisions and wounds clean with soap & water.   Try Gentle Massage and/or Stretching Massage at the area of pain many times a day Stop if you feel pain - do not overdo it Take over the counter pain medication This helps the muscle and nerve tissues become less irritable and calm down faster Choose ONE of the following over-the-counter anti-inflammatory medications: Acetaminophen 500mg tabs (Tylenol) 1-2 pills with every meal and just before bedtime (avoid if you have liver problems or if you have   acetaminophen in you narcotic prescription) Naproxen 220mg tabs (ex. Aleve, Naprosyn) 1-2 pills twice a day (avoid if you have kidney, stomach, IBD, or bleeding problems) Ibuprofen 200mg tabs (ex. Advil, Motrin) 3-4 pills with every meal and just before bedtime (avoid if you have kidney, stomach, IBD, or bleeding problems) Take with food/snack several times a day as directed for at least 2 weeks to help keep pain / soreness down & more manageable. Take Narcotic prescription pain medication for more severe pain A prescription for strong pain control is often given to you upon discharge (for example: oxycodone/Percocet, hydrocodone/Norco/Vicodin, or tramadol/Ultram) Take your pain medication as prescribed. Be mindful that most narcotic prescriptions contain Tylenol (acetaminophen) as well - avoid taking too much Tylenol. If you are having problems/concerns with the prescription medicine (does  not control pain, nausea, vomiting, rash, itching, etc.), please call us [phone] to see if we need to switch you to a different pain medicine that will work better for you and/or control your side effects better. If you need a refill on your pain medication, you must call the office before 4 pm and on weekdays only.  By federal law, prescriptions for narcotics cannot be called into a pharmacy.  They must be filled out on paper & picked up from our office by the patient or authorized caretaker.  Prescriptions cannot be filled after 4 pm nor on weekends.    WHEN TO CALL US [phone] Severe uncontrolled or worsening pain  Fever over 101 F (38.5 C) Concerns with the incision: Worsening pain, redness, rash/hives, swelling, bleeding, or drainage Reactions / problems with new medications (itching, rash, hives, nausea, etc.) Nausea and/or vomiting Difficulty urinating Difficulty breathing Worsening fatigue, dizziness, lightheadedness, blurred vision Other concerns If you are not getting better after two weeks or are noticing you are getting worse, contact our office [phone] for further advice.  We may need to adjust your medications, re-evaluate you in the office, send you to the emergency room, or see what other things we can do to help. The clinic staff is available to answer your questions during regular business hours (8:30am-5pm).  Please don't hesitate to call and ask to speak to one of our nurses for clinical concerns.    A surgeon from [facility] is always on call at the hospitals 24 hours/day If you have a medical emergency, go to the nearest emergency room or call 911.  FOLLOW UP in our office One the day of your discharge from the hospital (or the next business weekday), please call [facility] to set up or confirm an appointment to see your surgeon in the office for a follow-up appointment.  Usually it is 2-3 weeks after your surgery.   If you  have skin staples at your incision(s), let the office know so we can set up a time in the office for the nurse to remove them (usually around 10 days after surgery). Make sure that you call for appointments the day of discharge (or the next business weekday) from the hospital to ensure a convenient appointment time. IF YOU HAVE DISABILITY OR FAMILY LEAVE FORMS, BRING THEM TO THE OFFICE FOR PROCESSING.  DO NOT GIVE THEM TO YOUR DOCTOR.  [facility] [address], [address], [place], [place]  [address] ? [phone] - Main [phone] - Toll Free,  [phone] - Fax [email or web address]    GETTING TO GOOD BOWEL HEALTH. It is expected for your digestive tract to   need a few months to get back to normal.  It is common for your bowel movements and stools to be irregular.  You will have occasional bloating and cramping that should eventually fade away.  Until you are eating solid food normally, off all pain medications, and back to regular activities; your bowels will not be normal.   Avoiding constipation The goal: ONE SOFT BOWEL MOVEMENT A DAY!    Drink plenty of fluids.  Choose water first. TAKE A FIBER SUPPLEMENT EVERY DAY THE REST OF YOUR LIFE During your first week back home, gradually add back a fiber supplement every day Experiment which form you can tolerate.   There are many forms such as powders, tablets, wafers, gummies, etc Psyllium bran (Metamucil), methylcellulose (Citrucel), Miralax or Glycolax, Benefiber, Flax Seed.  Adjust the dose week-by-week (1/2 dose/day to 6 doses a day) until you are moving your bowels 1-2 times a day.  Cut back the dose or try a different fiber product if it is giving you problems such as diarrhea or bloating. Sometimes a laxative is needed to help jump-start bowels if constipated until the fiber supplement can help regulate your bowels.  If you are tolerating eating & you are farting, it is okay to try a gentle  laxative such as double dose MiraLax, prune juice, or Milk of Magnesia.  Avoid using laxatives too often. Stool softeners can sometimes help counteract the constipating effects of narcotic pain medicines.  It can also cause diarrhea, so avoid using for too long. If you are still constipated despite taking fiber daily, eating solids, and a few doses of laxatives, call our office. Controlling diarrhea Try drinking liquids and eating bland foods for a few days to avoid stressing your intestines further. Avoid dairy products (especially milk & ice cream) for a short time.  The intestines often can lose the ability to digest lactose when stressed. Avoid foods that cause gassiness or bloating.  Typical foods include beans and other legumes, cabbage, broccoli, and dairy foods.  Avoid greasy, spicy, fast foods.  Every person has some sensitivity to other foods, so listen to your body and avoid those foods that trigger problems for you. Probiotics (such as active yogurt, Align, etc) may help repopulate the intestines and colon with normal bacteria and calm down a sensitive digestive tract Adding a fiber supplement gradually can help thicken stools by absorbing excess fluid and retrain the intestines to act more normally.  Slowly increase the dose over a few weeks.  Too much fiber too soon can backfire and cause cramping & bloating. It is okay to try and slow down diarrhea with a few doses of antidiarrheal medicines.   Bismuth subsalicylate (ex. Kayopectate, Pepto Bismol) for a few doses can help control diarrhea.  Avoid if pregnant.   Loperamide (Imodium) can slow down diarrhea.  Start with one tablet (2mg) first.  Avoid if you are having fevers or severe pain.  ILEOSTOMY PATIENTS WILL HAVE CHRONIC DIARRHEA since their colon is not in use.    Drink plenty of liquids.  You will need to drink even more glasses of water/liquid a day to avoid getting dehydrated. Record output from your ileostomy.  Expect to empty  the bag every 3-4 hours at first.  Most people with a permanent ileostomy empty their bag 4-6 times at the least.   Use antidiarrheal medicine (especially Imodium) several times a day to avoid getting dehydrated.  Start with a dose at bedtime & breakfast.  Adjust up or   down as needed.  Increase antidiarrheal medications as directed to avoid emptying the bag more than 8 times a day (every 3 hours). Work with your wound ostomy nurse to learn care for your ostomy.  See ostomy care instructions. TROUBLESHOOTING IRREGULAR BOWELS 1) Start with a soft & bland diet. No spicy, greasy, or fried foods.  2) Avoid gluten/wheat or dairy products from diet to see if symptoms improve. 3) Miralax 17gm or flax seed mixed in 8oz. water or juice-daily. May use 2-4 times a day as needed. 4) Gas-X, Phazyme, etc. as needed for gas & bloating.  5) Prilosec (omeprazole) over-the-counter as needed 6)  Consider probiotics (Align, Activa, etc) to help calm the bowels down  Call your doctor if you are getting worse or not getting better.  Sometimes further testing (cultures, endoscopy, X-ray studies, CT scans, bloodwork, etc.) may be needed to help diagnose and treat the cause of the diarrhea. [facility] [address], [address], [place], [place]  [address] [phone] - Main.    [phone]  - Toll Free.   [phone] - Fax [email or web address]   ###############################   #######################################################  Ostomy Support Information  You've heard that people get along just fine with only one of their eyes, or one of their lungs, or one of their kidneys. But you also know that you have only one intestine and only one bladder, and that leaves you feeling awfully empty, both physically and emotionally: You think no other people go around without part of their intestine with the ends of their intestines sticking out through their abdominal walls.    YOU ARE NOT ALONE.  There are nearly three quarters of a million people in the [place] who have an ostomy; people who have had surgery to remove all or part of their colons or bladders.   There is even a national association, the [employer] with over 350 local affiliated support groups that are organized by volunteers who provide peer support and counseling. [employer] has a toll free telephone num-ber, 800-826-0826 and an educational, interactive website, www.ostomy.org   An ostomy is an opening in the belly (abdominal wall) made by surgery. Ostomates are people who have had this procedure. The opening (stoma) allows the kidney or bowel to grdischarge waste. An external pouch covers the stoma to collect waste. Pouches are are a simple bag and are odor free. Different companies have disposable or reusable pouches to fit one's lifestyle. An ostomy can either be temporary or permanent.   THERE ARE THREE MAIN TYPES OF OSTOMIES Colostomy. A colostomy is a surgically created opening in the large intestine (colon). Ileostomy. An ileostomy is a surgically created opening in the small intestine. Urostomy. A urostomy is a surgically created opening to divert urine away from the bladder.  OSTOMY Care  The following guidelines will make care of your colostomy easier. Keep this information close by for quick reference.  Helpful DIET hints Eat a well-balanced diet including vegetables and fresh fruits. Eat on a regular schedule.  Drink at least 6 to 8 glasses of fluids daily. Eat slowly in a relaxed atmosphere. Chew your food thoroughly. Avoid chewing gum, smoking, and drinking from a straw. This will help decrease the amount of air you swallow, which may help reduce gas. Eating yogurt or drinking buttermilk may help reduce gas.  To control gas at night, do not eat after 8 p.m. This will give your bowel time to quiet down before you go   to bed.  If gas is a problem, you can purchase  Beano. Sprinkle Beano on the first bite of food before eating to reduce gas. It has no flavor and should not change the taste of your food. You can buy Beano over the counter at your local drugstore.  Foods like fish, onions, garlic, broccoli, asparagus, and cabbage produce odor. Although your pouch is odor-proof, if you eat these foods you may notice a stronger odor when emptying your pouch. If this is a concern, you may want to limit these foods in your diet.  If you have an ileostomy, you will have chronic diarrhea & need to drink more liquids to avoid getting dehydrated.  Consider antidiarrheal medicine like imodium (loperamide) or Lomotil to help slow down bowel movements / diarrhea into your ileostomy bag.  GETTING TO GOOD BOWEL HEALTH WITH AN ILEOSTOMY    With the colon bypassed & not in use, you will have small bowel diarrhea.   It is important to thicken & slow your bowel movements down.   The goal: 4-6 small BOWEL MOVEMENTS A DAY It is important to drink plenty of liquids to avoid getting dehydrated  CONTROLLING ILEOSTOMY DIARRHEA  TAKE A FIBER SUPPLEMENT (FiberCon or Benefiner soluble fiber) twice a day - to thicken stools by absorbing excess fluid and retrain the intestines to act more normally.  Slowly increase the dose over a few weeks.  Too much fiber too soon can backfire and cause cramping & bloating.  TAKE AN IRON SUPPLEMENT twice a day to naturally constipate your bowels.  Usually ferrous sulfate 325mg twice a day)  TAKE ANTI-DIARRHEAL MEDICINES: Loperamide (Imodium) can slow down diarrhea.  Start with two tablets (= 4mg) first and then try one tablet every 6 hours.  Can go up to 2 pills four times day (8 pills of 2mg max) Avoid if you are having fevers or severe pain.  If you are not better or start feeling worse, stop all medicines and call your doctor for advice LoMotil (Diphenoxylate / Atropine) is another medicine that can constipate & slow down bowel moevements Pepto  Bismol (bismuth) can gently thicken bowels as well  If diarrhea is worse,: drink plenty of liquids and try simpler foods for a few days to avoid stressing your intestines further. Avoid dairy products (especially milk & ice cream) for a short time.  The intestines often can lose the ability to digest lactose when stressed. Avoid foods that cause gassiness or bloating.  Typical foods include beans and other legumes, cabbage, broccoli, and dairy foods.  Every person has some sensitivity to other foods, so listen to our body and avoid those foods that trigger problems for you.Call your doctor if you are getting worse or not better.  Sometimes further testing (cultures, endoscopy, X-ray studies, bloodwork, etc) may be needed to help diagnose and treat the cause of the diarrhea. Take extra anti-diarrheal medicines (maximum is 8 pills of 2mg loperamide a day)   Tips for POUCHING an OSTOMY   Changing Your Pouch The best time to change your pouch is in the morning, before eating or drinking anything. Your stoma can function at any time, but it will function more after eating or drinking.   Applying the pouching system  Place all your equipment close at hand before removing your pouch.  Wash your hands.  Stand or sit in front of a mirror. Use the position that works best for you. Remember that you must keep the skin around the stoma   wrinkle-free for a good seal.  Gently remove the used pouch (1-piece system) or the pouch and old wafer (2-piece system). Empty the pouch into the toilet. Save the closure clip to use again.  Wash the stoma itself and the skin around the stoma. Your stoma may bleed a little when being washed. This is normal. Rinse and pat dry. You may use a wash cloth or soft paper towels (like Bounty), mild soap (like Dial, Safeguard, or Ivory), and water. Avoid soaps that contain perfumes or lotions.  For a new pouch (1-piece system) or a new wafer (2-piece system), measure your  stoma using the stoma guide in each box of supplies.  Trace the shape of your stoma onto the back of the new pouch or the back of the new wafer. Cut out the opening. Remove the paper backing and set it aside.  Optional: Apply a skin barrier powder to surrounding skin if it is irritated (bare or weeping), and dust off the excess. Optional: Apply a skin-prep wipe (such as Skin Prep or All-Kare) to the skin around the stoma, and let it dry. Do not apply this solution if the skin is irritated (red, tender, or broken) or if you have shaved around the stoma. Optional: Apply a skin barrier paste (such as Stomahesive, Coloplast, or Premium) around the opening cut in the back of the pouch or wafer. Allow it to dry for 30 to 60 seconds.  Hold the pouch (1-piece system) or wafer (2-piece system) with the sticky side toward your body. Make sure the skin around the stoma is wrinkle-free. Center the opening on the stoma, then press firmly to your abdomen (Fig. 4). Look in the mirror to check if you are placing the pouch, or wafer, in the right position. For a 2-piece system, snap the pouch onto the wafer. Make sure it snaps into place securely.  Place your hand over the stoma and the pouch or wafer for about 30 seconds. The heat from your hand can help the pouch or wafer stick to your skin.  Add deodorant (such as Super Banish or Nullo) to your pouch. Other options include food extracts such as vanilla oil and peppermint extract. Add about 10 drops of the deodorant to the pouch. Then apply the closure clamp. Note: Do not use toxic  chemicals or commercial cleaning agents in your pouch. These substances may harm the stoma.  Optional: For extra seal, apply tape to all 4 sides around the pouch or wafer, as if you were framing a picture. You may use any brand of medical adhesive tape. Change your pouch every 5 to 7 days. Change it immediately if a leak occurs.  Wash your hands afterwards.  If you are wearing a  2-piece system, you may use 2 new pouches per week and alternate them. Rinse the pouch with mild soap and warm water and hang it to dry for the next day. Apply the fresh pouch. Alternate the 2 pouches like this for a week. After a week, change the wafer and begin with 2 new pouches. Place the old pouches in a plastic bag, and put them in the trash.   LIVING WITH AN OSTOMY  Emptying Your Pouch Empty your pouch when it is one-third full (of urine, stool, and/or gas). If you wait until your pouch is fuller than this, it will be more difficult to empty and more noticeable. When you empty your pouch, either put toilet paper in the toilet bowl first, or flush the   toilet while you empty the pouch. This will reduce splashing. You can empty the pouch between your legs or to one side while sitting, or while standing or stooping. If you have a 2-piece system, you can snap off the pouch to empty it. Remember that your stoma may function during this time. If you wish to rinse your pouch after you empty it, a turkey baster can be helpful. When using a baster, squirt water up into the pouch through the opening at the bottom. With a 2-piece system, you can snap off the pouch to rinse it. After rinsing  your pouch, empty it into the toilet. When rinsing your pouch at home, put a few granules of Dreft soap in the rinse water. This helps lubricate and freshen your pouch. The inside of your pouch can be sprayed with non-stick cooking oil (Pam spray). This may help reduce stool sticking to the inside of the pouch.  Bathing You may shower or bathe with your pouch on or off. Remember that your stoma may function during this time.  The materials you use to wash your stoma and the skin around it should be clean, but they do not need to be sterile.  Wearing Your Pouch During hot weather, or if you perspire a lot in general, wear a cover over your pouch. This may prevent a rash on your skin under the pouch. Pouch covers are  sold at ostomy supply stores. Wear the pouch inside your underwear for better support. Watch your weight. Any gain or loss of 10 to 15 pounds or more can change the way your pouch fits.  Going Away From Home A collapsible cup (like those that come in travel kits) or a soft plastic squirt bottle with a pull-up top (like a travel bottle for shampoo) can be used for rinsing your pouch when you are away from home. Tilt the opening of the pouch at an upward angle when using a cup to rinse.  Carry wet wipes or extra tissues to use in public bathrooms.  Carry an extra pouching system with you at all times.  Never keep ostomy supplies in the glove compartment of your car. Extreme heat or cold can damage the skin barriers and adhesive wafers on the pouch.  When you travel, carry your ostomy supplies with you at all times. Keep them within easy reach. Do not pack ostomy supplies in baggage that will be checked or otherwise separated from you, because your baggage might be lost. If you're traveling out of the country, it is helpful to have a letter stating that you are carrying ostomy supplies as a medical necessity.  If you need ostomy supplies while traveling, look in the yellow pages of the telephone book under "Surgical Supplies." Or call the local ostomy organization to find out where supplies are available.  Do not let your ostomy supplies get low. Always order new pouches before you use the last one.  Reducing Odor Limit foods such as broccoli, cabbage, onions, fish, and garlic in your diet to help reduce odor. Each time you empty your pouch, carefully clean the opening of the pouch, both inside and outside, with toilet paper. Rinse your pouch 1 or 2 times daily after you empty it (see directions for emptying your pouch and going away from home). Add deodorant (such as Super Banish or Nullo) to your pouch. Use air deodorizers in your bathroom. Do not add aspirin to your pouch. Even though  aspirin can help prevent odor, it   could cause ulcers on your stoma.  When to call the doctor Call the doctor if you have any of the following symptoms: Purple, black, or white stoma Severe cramps lasting more than 6 hours Severe watery discharge from the stoma lasting more than 6 hours No output from the colostomy for 3 days Excessive bleeding from your stoma Swelling of your stoma to more than 1/2-inch larger than usual Pulling inward of your stoma below skin level Severe skin irritation or deep ulcers Bulging or other changes in your abdomen  When to call your ostomy nurse Call your ostomy/enterostomal therapy (WOCN) nurse if any of the following occurs: Frequent leaking of your pouching system Change in size or appearance of your stoma, causing discomfort or problems with your pouch Skin rash or rawness Weight gain or loss that causes problems with your pouch     FREQUENTLY ASKED QUESTIONS   Why haven't you met any of these folks who have an ostomy?  Well, maybe you have! You just did not recognize them because an ostomy doesn't show. It can be kept secret if you wish. Why, maybe some of your best friends, office associates or neighbors have an ostomy ... you never can tell. People facing ostomy surgery have many quality-of-life questions like: Will you bulge? Smell? Make noises? Will you feel waste leaving your body? Will you be a captive of the toilet? Will you starve? Be a social outcast? Get/stay married? Have babies? Easily bathe, go swimming, bend over?  OK, let's look at what you can expect:   Will you bulge?  Remember, without part of the intestine or bladder, and its contents, you should have a flatter tummy than before. You can expect to wear, with little exception, what you wore before surgery ... and this in-cludes tight clothing and bathing suits.   Will you smell?  Today, thanks to modern odor proof pouching systems, you can walk into an ostomy support group  meeting and not smell anything that is foul or offensive. And, for those with an ileostomy or colostomy who are concerned about odor when emptying their pouch, there are in-pouch deodorants that can be used to eliminate any waste odors that may exist.   Will you make noises?  Everyone produces gas, especially if they are an air-swallower. But intestinal sounds that occur from time to time are no differ-ent than a gurgling tummy, and quite often your clothing will muffle any sounds.   Will you feel the waste discharges?  For those with a colostomy or ileostomy there might be a slight pressure when waste leaves your body, but understand that the intestines have no nerve endings, so there will be no unpleasant sensations. Those with a urostomy will probably be unaware of any kidney drainage.   Will you be a captive of the toilet?  Immediately post-op you will spend more time in the bathroom than you will after your body recovers from surgery. Every person is different, but on average those with an ileostomy or urostomy may empty their pouches 4 to 6 times a day; a little  less if you have a colostomy. The average wear time between pouch system changes is 3 to 5 days and the changing process should take less than 30 minutes.   Will I need to be on a special diet? Most people return to their normal diet when they have recovered from surgery. Be sure to chew your food well, eat a well-balanced diet and drink plenty of fluids. If   you experience problems with a certain food, wait a couple of weeks and try it again.  Will there be odor and noises? Pouching systems are designed to be odor-proof or odor-resistant. There are deodorants that can be used in the pouch. Medications are also available to help reduce odor. Limit gas-producing foods and carbonated beverages. You will experience less gas and fewer noises as you heal from surgery.  How much time will it take to care for my ostomy? At first, you may  spend a lot of time learning about your ostomy and how to take care of it. As you become more comfortable and skilled at changing the pouching system, it will take very little time to care for it.   Will I be able to return to work? People with ostomies can perform most jobs. As soon as you have healed from surgery, you should be able to return to work. Heavy lifting (more than 10 pounds) may be discouraged.   What about intimacy? Sexual relationships and intimacy are important and fulfilling aspects of your life. They should continue after ostomy surgery. Intimacy-related concerns should be discussed openly between you and your partner.   Can I wear regular clothing? You do not need to wear special clothing. Ostomy pouches are fairly flat and barely noticeable. Elastic undergarments will not hurt the stoma or prevent the ostomy from functioning.   Can I participate in sports? An ostomy should not limit your involvement in sports. Many people with ostomies are runners, skiers, swimmers or participate in other active lifestyles. Talk with your caregiver first before doing heavy physical activity.  Will you starve?  Not if you follow doctor's orders at each stage of your post-op adjustment. There is no such thing as an "ostomy diet". Some people with an ostomy will be able to eat and tolerate anything; others may find diffi-culty with some foods. Each person is an individual and must determine, by trial, what is best for them. A good practice for all is to drink plenty of water.   Will you be a social outcast?  Have you met anyone who has an ostomy and is a social outcast? Why should you be the first? Only your attitude and self image will effect how you are treated. No confi-dent person is an outcast.    PROFESSIONAL HELP   Resources are available if you need help or have questions about your ostomy.   Specially trained nurses called Wound, Ostomy Continence Nurses (WOCN) are available for  consultation in most major medical centers.  Consider getting an ostomy consult at an outpatient ostomy clinic.   [facility] has an [facility] run by an WOCN ostomy nurse at the  Hospital campus.  [phone]. [facility] can help set up an appointment   The United Ostomy Association (UOA) is a group made up of many local chapters throughout the [place]. These local groups hold meetings and provide support to prospective and existing ostomates. They sponsor educational events and have qualified visitors to make personal or telephone visits. Contact the UOA for the chapter nearest you and for other educational publications.  More detailed information can be found in Colostomy Guide, a publication of the United Ostomy Association (UOA). Contact UOA at 1-800-826-0826 or visit their web site at www.uoaa.org. The website contains links to other sites, suppliers and resources.  [employer] Secure Start Services: Start at the website to enlist for support.  Your Wound Ostomy (WOCN) nurse may have started this   process. https://www.[employer].com/en/securestart Secure Start services are designed to support people as they live their lives with an ostomy or neurogenic bladder. Enrolling is easy and at no cost to the patient. We realize that each person's needs and life journey are different. Through Secure Start services, we want to help people live their life, their way.  #######################################################  

## 2022-01-13 LAB — SURGICAL PATHOLOGY

## 2022-01-13 NOTE — Op Note (Signed)
Diagnosis: Diverticulitis, here for low anterior resection  Postop diagnosis: Same  Principal procedure: Cystoscopy, injection of firefly into both ureters  Surgeon: Herchel Hopkin  Anesthesia: General endotracheal  Complications: None  Estimated blood loss: None  Indications: 66 year old female presents for low anterior resection for management of symptomatic diverticulitis.  I have been asked to inject firefly preoperatively due to decrease the risk of ureteral injury.  Findings: Urethra was normal.  Bladder was normal without urothelial abnormalities.  Ureteral orifices were normal in location and configuration  Description of procedure: The patient was taken the operating room where general anesthetic was administered.  She was placed in the dorsolithotomy position.  Genitalia and perineum were prepped, draped, proper timeout performed.  21 French panendoscope advanced into the bladder.  After normal systematic inspection, a 6 [place] open-ended catheter was utilized to inject 10 cc of firefly into each ureter.  Following this, 96 French Foley catheter was placed, balloon filled with 10 cc of water in the sink to dependent drainage.  The procedure was terminated at that point.  Dr. Marcello Moores then proceeded with the low anterior resection.

## 2022-04-10 ENCOUNTER — Telehealth (HOSPITAL_BASED_OUTPATIENT_CLINIC_OR_DEPARTMENT_OTHER): Payer: Self-pay

## 2022-04-10 DIAGNOSIS — I517 Cardiomegaly: Secondary | ICD-10-CM

## 2022-04-10 MED ORDER — METOPROLOL SUCCINATE ER 25 MG PO TB24: 25.0000 mg | ORAL_TABLET | Freq: Every day | ORAL | 3 refills | Status: DC

## 2022-04-10 NOTE — Telephone Encounter (Signed)
Received fax from [facility] requesting refills for Metoprolol Succinate. Rx request sent to pharmacy.

## 2022-07-25 ENCOUNTER — Ambulatory Visit (INDEPENDENT_AMBULATORY_CARE_PROVIDER_SITE_OTHER): Payer: Medicare HMO

## 2022-07-25 ENCOUNTER — Encounter: Payer: Self-pay | Admitting: Podiatry

## 2022-07-25 ENCOUNTER — Ambulatory Visit: Payer: Medicare HMO | Admitting: Podiatry

## 2022-07-25 DIAGNOSIS — M76822 Posterior tibial tendinitis, left leg: Secondary | ICD-10-CM

## 2022-07-25 DIAGNOSIS — M79672 Pain in left foot: Secondary | ICD-10-CM

## 2022-07-25 MED ORDER — TRIAMCINOLONE ACETONIDE 10 MG/ML IJ SUSP
10.0000 mg | Freq: Once | INTRAMUSCULAR | Status: AC
  Administered 2022-07-25: 10 mg

## 2022-07-28 NOTE — Progress Notes (Signed)
Subjective:   Patient ID: Gabrielle Gonzalez, female   DOB: 67 y.o.   MRN: [id number]   HPI Patient presents stating she has been getting pain in her left ankle for around 6 months especially when she is active or ballroom dancing.  States the areas that we did surgery on her doing well   ROS      Objective:  Physical Exam  Neurovascular status intact with the patient's left posterior tibial tendon as it gets near its insertion and at its insertion navicular is inflamed with moderate depression of the arch noted left     Assessment:  Acute posterior tibial tendinitis left with inflammation with history of surgery left doing well     Plan:  H&P educated her on condition and did x-ray.  Today I did a sheath injection left 3 mg dexamethasone Kenalog 5 mg Xylocaine near the insertion and I discussed shoe gear modifications and I placed her into a fascial brace fitted properly into the arch to elevate the arch and take stress off the arch complex  X-rays indicate there is depression of the arch of a moderate nature possibility for accessory navicular no indications of acute injury

## 2022-08-08 ENCOUNTER — Other Ambulatory Visit: Payer: Self-pay | Admitting: Podiatry

## 2022-08-08 DIAGNOSIS — M79672 Pain in left foot: Secondary | ICD-10-CM

## 2022-08-08 DIAGNOSIS — M76822 Posterior tibial tendinitis, left leg: Secondary | ICD-10-CM

## 2022-09-09 ENCOUNTER — Other Ambulatory Visit: Payer: Self-pay | Admitting: Nurse Practitioner

## 2022-09-09 DIAGNOSIS — Z1231 Encounter for screening mammogram for malignant neoplasm of breast: Secondary | ICD-10-CM

## 2022-09-10 ENCOUNTER — Ambulatory Visit
Admission: RE | Admit: 2022-09-10 | Discharge: 2022-09-10 | Disposition: A | Discharge status: Home / Self Care | Payer: Medicare HMO | Source: Ambulatory Visit | Attending: Nurse Practitioner | Admitting: Nurse Practitioner

## 2022-09-10 DIAGNOSIS — Z1231 Encounter for screening mammogram for malignant neoplasm of breast: Secondary | ICD-10-CM

## 2022-09-15 ENCOUNTER — Other Ambulatory Visit: Payer: Self-pay | Admitting: Nurse Practitioner

## 2022-09-15 DIAGNOSIS — R928 Other abnormal and inconclusive findings on diagnostic imaging of breast: Secondary | ICD-10-CM

## 2022-09-19 ENCOUNTER — Ambulatory Visit: Admission: RE | Admit: 2022-09-19 | Payer: Medicare HMO | Source: Ambulatory Visit

## 2022-09-19 ENCOUNTER — Ambulatory Visit
Admission: RE | Admit: 2022-09-19 | Discharge: 2022-09-19 | Disposition: A | Discharge status: Home / Self Care | Payer: Medicare HMO | Source: Ambulatory Visit | Attending: Nurse Practitioner | Admitting: Nurse Practitioner

## 2022-09-19 DIAGNOSIS — R928 Other abnormal and inconclusive findings on diagnostic imaging of breast: Secondary | ICD-10-CM

## 2022-12-17 ENCOUNTER — Encounter (HOSPITAL_BASED_OUTPATIENT_CLINIC_OR_DEPARTMENT_OTHER): Payer: Self-pay | Admitting: Cardiology

## 2022-12-17 ENCOUNTER — Ambulatory Visit (HOSPITAL_BASED_OUTPATIENT_CLINIC_OR_DEPARTMENT_OTHER): Payer: Medicare HMO | Admitting: Cardiology

## 2022-12-17 VITALS — BP 132/74 | HR 54 | Ht 62.0 in | Wt 134.6 lb

## 2022-12-17 DIAGNOSIS — Z01812 Encounter for preprocedural laboratory examination: Secondary | ICD-10-CM

## 2022-12-17 DIAGNOSIS — E782 Mixed hyperlipidemia: Secondary | ICD-10-CM

## 2022-12-17 DIAGNOSIS — Q248 Other specified congenital malformations of heart: Secondary | ICD-10-CM

## 2022-12-17 DIAGNOSIS — R0609 Other forms of dyspnea: Secondary | ICD-10-CM | POA: Diagnosis not present

## 2022-12-17 DIAGNOSIS — I10 Essential (primary) hypertension: Secondary | ICD-10-CM

## 2022-12-17 DIAGNOSIS — Z8249 Family history of ischemic heart disease and other diseases of the circulatory system: Secondary | ICD-10-CM

## 2022-12-17 DIAGNOSIS — Z7189 Other specified counseling: Secondary | ICD-10-CM

## 2022-12-17 DIAGNOSIS — I517 Cardiomegaly: Secondary | ICD-10-CM | POA: Diagnosis not present

## 2022-12-17 LAB — BASIC METABOLIC PANEL
BUN/Creatinine Ratio: 18 (ref 12–28)
BUN: 18 mg/dL (ref 8–27)
CO2: 22 mmol/L (ref 20–29)
Calcium: 9.7 mg/dL (ref 8.7–10.3)
Chloride: 102 mmol/L (ref 96–106)
Creatinine, Ser: 1 mg/dL (ref 0.57–1.00)
Glucose: 98 mg/dL (ref 70–99)
Potassium: 4.4 mmol/L (ref 3.5–5.2)
Sodium: 139 mmol/L (ref 134–144)
eGFR: 63 mL/min/{1.73_m2} (ref >59)

## 2022-12-17 NOTE — Progress Notes (Signed)
Cardiology Office Note:  .    Date:  12/17/2022  ID:  [name], DOB 08-07-1955, MRN [id number] PCP: [name], NP  [facility] Providers Cardiologist:  [name], MD     History of Present Illness: .    Gabrielle Gonzalez is a 67 y.o. female with a hx of hyperlipidemia, GERD, arthritis, and depression, who is seen for follow-up. She was initially seen 04/08/2021 as a new consult at the request of [name], NP for the evaluation and management of newly recognized heart murmur and abnormal echo showing moderate mitral regurgitation.   Notes from [name], NP reviewed 03/28/21, annual physical. Echo report reviewed in Care Everywhere, unable to see actual images.   Cardiovascular risk factors: Prior clinical ASCVD: None Comorbid conditions: Typically, her at home blood pressure was always well controlled around 110/70. Only recently has her blood pressure been averaging in the 130s-140s/90s. Family history: Her mother had hypertension. Her older sister has hypertension, and a history of slight heart murmur. Her younger sister was told she had a slight mitral valve prolapse. Prior cardiac testing and/or incidental findings on other testing (ie coronary calcium): TTE 03/13/21 from Care Everywhere reviewed at length. While she was in Urgent Care 02/2021 she was first told that she had a heart murmur. Exercise level: Not much since her COVID infection. She has been trying to increase her exercise with cardio classes twice a week. Of note, she endorses exertional shortness of breath, as well as nausea and emesis if she pushes herself too much. She denies any syncopal episodes. Typically she scales back if she begins to feel ill. Occasionally she will perform 8 lb arm curls and 3 lb overhead weight lifting. Current diet: Will work on staying hydrated.   She presented to urgent care for strep throat, told she had a murmur. She saw her PCP 02/14/21, noted to have 2/6  systolic murmur. Echo ordered, see full report. 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 Gradient measured at 212 but thought not to be accurate. Moderate MR due to [facility].   On 07/22/2021 she was admitted to the hospital after being found to have a colonic diverticular abscess. She underwent CT guided aspiration.   At her visit 10/2021 she was feeling well and tolerating metoprolol. She noted it took a little more effort to increase her heart rate during exercise. Also felt some faster/heavier heart beats when carrying something upstairs. Planned to revisit discussion of statin therapy. In 01/2022 she underwent colonic resection.   Today, she states she is feeling pretty good. However, she is concerned that when she climbs upstairs or inclines she does experience shortness of breath. Went hiking in late August which she completed but needed to stop and rest several times. She remains active with exercise machines such as the stair-climber to try and increase her exercise tolerance. Occasionally she is aware of "a weird feeling" in her chest that she would not describe as a tightness or discomfort, no racing heart rates.  In her family, her youngest sister had an elevated coronary calcium score, and needed to have a stent placed last year.  She denies any peripheral edema, lightheadedness, headaches, syncope, orthopnea, or PND.  ROS:  Please see the history of present illness. ROS otherwise negative except as noted.  (+) Shortness of breath with walking on inclines/stairs  Studies Reviewed: Marland Kitchen    EKG Interpretation Date/Time:  Wednesday  December 17 2022 07:59:25 EDT Ventricular Rate:  54 PR Interval:  184 QRS Duration:  82 QT Interval:  448 QTC Calculation: 424 R Axis:   38  Text Interpretation: Sinus bradycardia Confirmed by [name] [id number] on 12/17/2022 8:12:34 AM    Physical Exam:    VS:  BP 132/74    Pulse (!) 54   Ht 5\' 2"  (1.575 m)   Wt 134 lb 9.6 oz (61.1 kg)   LMP 06/18/2011   SpO2 98%   BMI 24.62 kg/m    Wt Readings from Last 3 Encounters:  12/17/22 134 lb 9.6 oz (61.1 kg)  01/10/22 131 lb 9.8 oz (59.7 kg)  01/01/22 132 lb (59.9 kg)    GEN: Well nourished, well developed in no acute distress HEENT: Normal, moist mucous membranes NECK: No JVD CARDIAC: regular rhythm, normal S1 and S2, no rubs or gallops. 2/6 soft murmur. VASCULAR: Radial and DP pulses 2+ bilaterally. No carotid bruits RESPIRATORY:  Clear to auscultation without rales, wheezing or rhonchi  ABDOMEN: Soft, non-tender, non-distended MUSCULOSKELETAL:  Ambulates independently SKIN: Warm and dry, no edema NEUROLOGIC:  Alert and oriented x 3. No focal neuro deficits noted. PSYCHIATRIC:  Normal affect   ASSESSMENT AND PLAN: .    Dyspnea on exertion, concern for anginal equivalent -discussed treadmill stress, nuclear stress/lexiscan, and CT coronary angiography. Discussed pros and cons of each, including but not limited to false positive/false negative risk, radiation risk, and risk of IV contrast dye. Based on shared decision making, decision was made to pursue CT coronary angiography. -counseled on need to get BMET prior to test -counseled on use of sublingual nitroglycerin and its importance to a good test -reviewed red flag warning signs that need immediate medical attention   UPDATED TO ADD: CT shows nonobstructive CAD. Recommend statin.   Focal basal septal hypertrophy Documented LVOT obstruction with SAM and mitral regurgitation -I cannot see images, but reported as focal basal septal hypertrophy with severe obstruction and SAM/MR -no syncope or limiting exertional symptoms -tolerating metoprolol, reviewed importance of BP control -reviewed red flag warning signs that need immediate medical attention -avoid dehydration   Hypertension: would keep tight control to prevent progression of LVH -continue  metoprolol -monitor BP   Mixed hyperlipidemia -on fenofibrate -lipids from 03/27/21: Tchol 377, TG 487, HDL 35, LDL could not be reported. Prior LDL 02/15/20 185 -I would strongly recommend statin. Continue to discuss at follow up. Consider calcium score.   Cardiac risk counseling and prevention recommendations: -recommend heart healthy/Mediterranean diet, with whole grains, fruits, vegetable, fish, lean meats, nuts, and olive oil. Limit salt. -recommend moderate walking, 3-5 times/week for 30-50 minutes each session. Aim for at least 150 minutes.week. Goal should be pace of 3 miles/hours, or walking 1.5 miles in 30 minutes -recommend avoidance of tobacco products. Avoid excess alcohol.  Dispo: Follow-up in 1 year, or sooner as needed.  I,Mathew Stumpf,acting as a Neurosurgeon for [employer], MD.,have documented all relevant documentation on the behalf of [name], MD,as directed by  [name], MD while in the presence of [name], MD.  I, [name], MD, have reviewed all documentation for this visit. The documentation on 01/14/23 for the exam, diagnosis, procedures, and orders are all accurate and complete.   Signed, [name], MD

## 2022-12-17 NOTE — Patient Instructions (Addendum)
Medication Instructions:  Your physician recommends that you continue on your current medications as directed. Please refer to the Current Medication list given to you today.   *If you need a refill on your cardiac medications before your next appointment, please call your pharmacy*  Lab Work: BMET TODAY   If you have labs (blood work) drawn today and your tests are completely normal, you will receive your results only by: MyChart Message (if you have MyChart) OR A paper copy in the mail If you have any lab test that is abnormal or we need to change your treatment, we will call you to review the results.  Testing/Procedures: Your physician has requested that you have cardiac CT. Cardiac computed tomography (CT) is a painless test that uses an x-ray machine to take clear, detailed pictures of your heart. For further information please visit [email or web address] Please follow instruction sheet as given.  Follow-Up: At [facility], you and your health needs are our priority.  As part of our continuing mission to provide you with exceptional heart care, we have created designated Provider Care Teams.  These Care Teams include your primary Cardiologist (physician) and Advanced Practice Providers (APPs -  Physician Assistants and Nurse Practitioners) who all work together to provide you with the care you need, when you need it.  We recommend signing up for the patient portal called "MyChart".  Sign up information is provided on this After Visit Summary.  MyChart is used to connect with patients for Virtual Visits (Telemedicine).  Patients are able to view lab/test results, encounter notes, upcoming appointments, etc.  Non-urgent messages can be sent to your provider as well.   To learn more about what you can do with MyChart, go to [email or web address].    Your next appointment:   12 month(s)  Provider:   [name], MD or Gillian Shields, NP    Other  Instructions   Your cardiac CT will be scheduled at one of the below locations:   [facility] [address] [place], [place] [address] [phone]  OR  [facility] [address] [place], [place] [address] [phone]  OR   [facility] [address] [place], [place] [address] [phone]  If scheduled at [facility], please arrive at the [facility] and Children's Entrance (Entrance C2) of [facility] 30 minutes prior to test start time. You can use the FREE valet parking offered at entrance C (encouraged to control the heart rate for the test)  Proceed to the [facility] Radiology Department (first floor) to check-in and test prep.  All radiology patients and guests should use entrance C2 at [facility], accessed from [facility], even though the hospital's physical address listed is [address].    If scheduled at [facility] or [facility], please arrive 15 mins early for check-in and test prep.  There is spacious parking and easy access to the radiology department from the [facility] Heart and Vascular entrance. Please enter here and check-in with the desk attendant.   Please follow these instructions carefully (unless otherwise directed):  An IV will be required for this test and Nitroglycerin will be given.  Hold all erectile dysfunction medications at least 3 days (72 hrs) prior to test. (Ie viagra, cialis, sildenafil, tadalafil, etc)   On the Night Before the Test: Be sure to Drink plenty of water. Do not consume any  caffeinated/decaffeinated beverages or chocolate 12 hours prior to your test. Do not take any antihistamines 12 hours prior to your test.  On the Day of the Test: Drink plenty of water until 1 hour prior to the test. Do not eat any food 1 hour prior to test. You may take your  regular medications prior to the test.  Take metoprolol (Lopressor) two hours prior to test. If you take Furosemide/Hydrochlorothiazide/Spironolactone, please HOLD on the morning of the test. FEMALES- please wear underwire-free bra if available, avoid dresses & tight clothing  After the Test: Drink plenty of water. After receiving IV contrast, you may experience a mild flushed feeling. This is normal. On occasion, you may experience a mild rash up to 24 hours after the test. This is not dangerous. If this occurs, you can take Benadryl 25 mg and increase your fluid intake. If you experience trouble breathing, this can be serious. If it is severe call 911 IMMEDIATELY. If it is mild, please call our office. If you take any of these medications: Glipizide/Metformin, Avandament, Glucavance, please do not take 48 hours after completing test unless otherwise instructed.  We will call to schedule your test 2-4 weeks out understanding that some insurance companies will need an authorization prior to the service being performed.   For more information and frequently asked questions, please visit our website : [email or web address]  For non-scheduling related questions, please contact the cardiac imaging nurse navigator should you have any questions/concerns: Cardiac Imaging Nurse Navigators Direct Office Dial: [phone]   For scheduling needs, including cancellations and rescheduling, please call [place], [phone].  Cardiac CT Angiogram A cardiac CT angiogram is a procedure to look at the heart and the area around the heart. It may be done to help find the cause of chest pains or other symptoms of heart disease. During this procedure, a substance called contrast dye is injected into a vein in the arm. The contrast highlights the blood vessels in the area to be checked. A large X-ray machine (CT scanner), then takes detailed pictures of the heart and the surrounding area. The  procedure is also sometimes called a coronary CT angiogram, coronary artery scanning, or CTA. A cardiac CT angiogram allows the health care provider to see how well blood is flowing to and from the heart. The provider will be able to see if there are any problems, such as: Blockage or narrowing of the arteries in the heart. Fluid around the heart. Signs of weakness or disease in the muscles, valves, and tissues of the heart. Tell a health care provider about: Any allergies you have. This is especially important if you have had a previous allergic reaction to medicines, contrast dye, or iodine. All medicines you are taking, including vitamins, herbs, eye drops, creams, and over-the-counter medicines. Any bleeding problems you have. Any surgeries you have had. Any medical conditions you have, including kidney problems or kidney failure. Whether you are pregnant or may be pregnant. Any anxiety disorders, chronic pain, or other conditions you have. These may increase your stress or prevent you from lying still. Any history of abnormal heart rhythms or heart procedures. What are the risks? Your provider will talk with you about risks. These may include: Bleeding. Infection. Allergic reactions to medicines or dyes. Damage to other structures or organs. Kidney damage from the contrast dye. Increased risk of cancer from radiation exposure. This risk is low. Talk with your provider about: The risks and benefits of testing. How you can receive the lowest  dose of radiation. What happens before the procedure? Wear comfortable clothing and remove any jewelry, glasses, dentures, and hearing aids. Follow instructions from your provider about eating and drinking. These may include: 12 hours before the procedure Avoid caffeine. This includes tea, coffee, soda, energy drinks, and diet pills. Drink plenty of water or other fluids that do not have caffeine in them. Being well hydrated can prevent  complications. 4-6 hours before the procedure Stop eating and drinking. This will reduce the risk of nausea from the contrast dye. Ask your provider about changing or stopping your regular medicines. These include: Diabetes medicines. Medicines to treat problems with erections (erectile dysfunction). If you have kidney problems, you may need to receive IV hydration before and after the test. What happens during the procedure?  Hair on your chest may need to be removed so that small sticky patches called electrodes can be placed on your chest. These will transmit information that helps to monitor your heart during the procedure. An IV will be inserted into one of your veins. You might be given a medicine to control your heart rate during the procedure. This will help to ensure that good images are obtained. You will be asked to lie on an exam table. This table will slide in and out of the CT machine during the procedure. Contrast dye will be injected into the IV. You might feel warm, or you may get a metallic taste in your mouth. You may be given medicines to relax or dilate the arteries in your heart. If you are allergic to contrast dyes or iodine you may be given medicine before the test to reduce the risk of an allergic reaction. The table that you are lying on will move into the CT machine tunnel for the scan. The person running the machine will give you instructions while the scans are being done. You may be asked to: Keep your arms above your head. Hold your breath for short periods. Stay very still, even if the table is moving. The procedure may vary among providers and hospitals. What can I expect after the procedure? After your procedure, it is common to have: A metallic taste in your mouth from the contrast dye. A feeling of warmth. A headache from the heart medicine. Follow these instructions at home: Take over-the-counter and prescription medicines only as told by your  provider. If you are told, drink enough fluid to keep your pee pale yellow. This will help to flush the contrast dye out of your body. Most people can return to their normal activities right after the procedure. Ask your provider what activities are safe for you. It is up to you to get the results of your procedure. Ask your provider, or the department that is doing the procedure, when your results will be ready. Contact a health care provider if: You have any symptoms of allergy to the contrast dye. These include: Shortness of breath. Rash or hives. A racing heartbeat. You notice a change in your peeing (urination). This information is not intended to replace advice given to you by your health care provider. Make sure you discuss any questions you have with your health care provider. Document Revised: 09/27/2021 Document Reviewed: 09/27/2021 Elsevier Patient Education  2024 [employer].

## 2022-12-31 ENCOUNTER — Encounter (HOSPITAL_COMMUNITY): Payer: Self-pay

## 2023-01-02 ENCOUNTER — Ambulatory Visit (HOSPITAL_COMMUNITY)
Admission: RE | Admit: 2023-01-02 | Discharge: 2023-01-02 | Disposition: A | Discharge status: Home / Self Care | Payer: Medicare HMO | Source: Ambulatory Visit | Attending: Cardiology

## 2023-01-02 DIAGNOSIS — R0609 Other forms of dyspnea: Secondary | ICD-10-CM | POA: Diagnosis present

## 2023-01-02 DIAGNOSIS — I2510 Atherosclerotic heart disease of native coronary artery without angina pectoris: Secondary | ICD-10-CM | POA: Insufficient documentation

## 2023-01-02 DIAGNOSIS — R079 Chest pain, unspecified: Secondary | ICD-10-CM | POA: Insufficient documentation

## 2023-01-02 MED ORDER — IOHEXOL 350 MG/ML SOLN
95.0000 mL | Freq: Once | INTRAVENOUS | Status: AC | PRN
  Administered 2023-01-02: 95 mL via INTRAVENOUS

## 2023-01-02 MED ORDER — NITROGLYCERIN 0.4 MG SL SUBL
0.8000 mg | SUBLINGUAL_TABLET | Freq: Once | SUBLINGUAL | Status: AC
  Administered 2023-01-02: 0.8 mg via SUBLINGUAL

## 2023-01-02 MED ORDER — NITROGLYCERIN 0.4 MG SL SUBL
SUBLINGUAL_TABLET | SUBLINGUAL | Status: AC
  Filled 2023-01-02: qty 2

## 2023-01-06 IMAGING — MG MM DIGITAL SCREENING BILAT W/ TOMO AND CAD
8 series · 9 of 24 positions shown · non-contrast
Comparison: Previous exam(s).

CLINICAL DATA: Screening.

EXAM:
DIGITAL SCREENING BILATERAL MAMMOGRAM WITH TOMOSYNTHESIS AND CAD
TECHNIQUE: Bilateral screening digital craniocaudal and mediolateral oblique
mammograms were obtained. Bilateral screening digital breast
tomosynthesis was performed. The images were evaluated with
computer-aided detection.

[R MLO synth-2D]
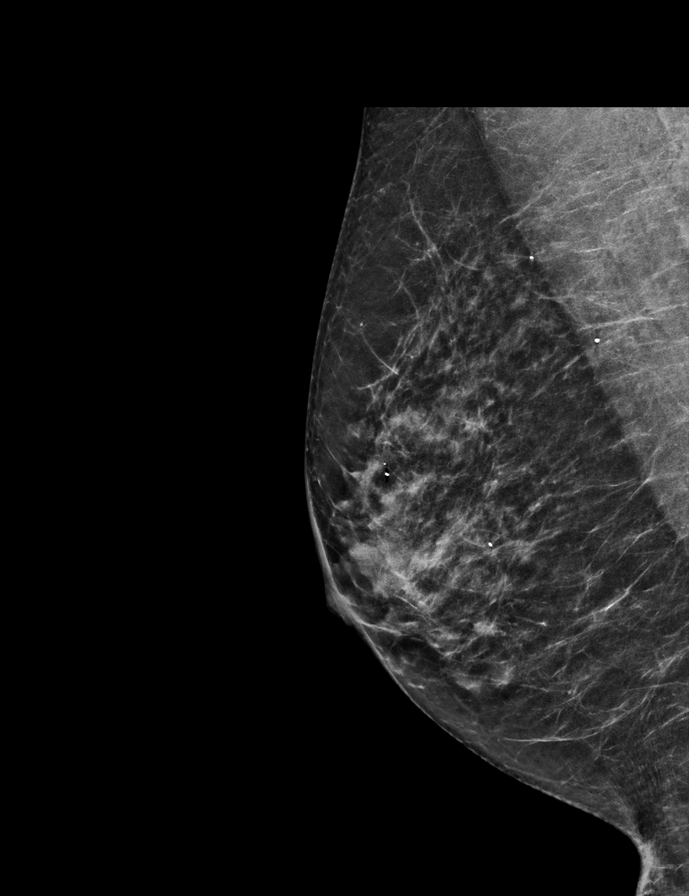

[L CC synth-2D]
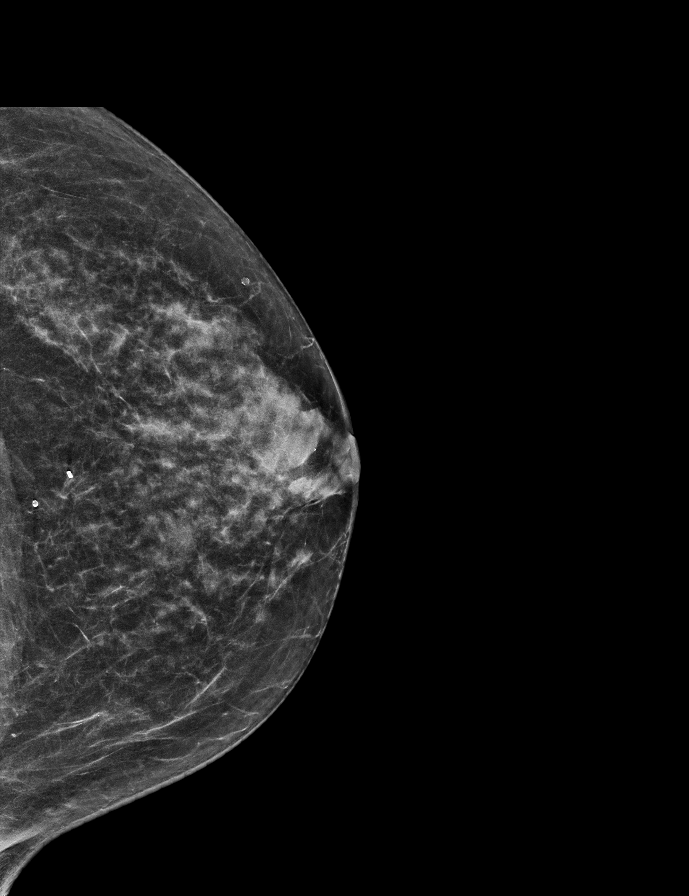

[R CC synth-2D]
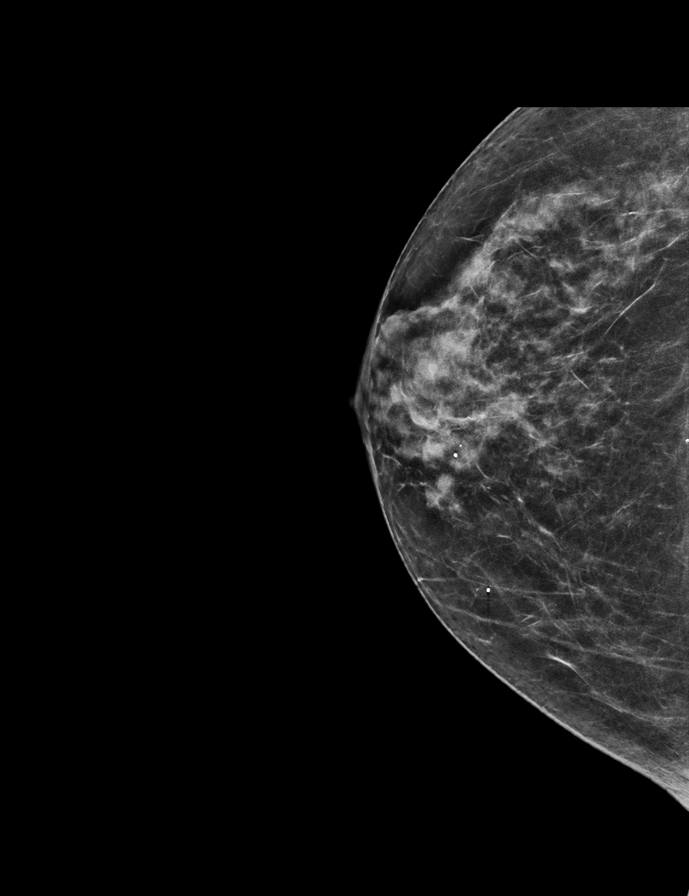

[L MLO synth-2D]
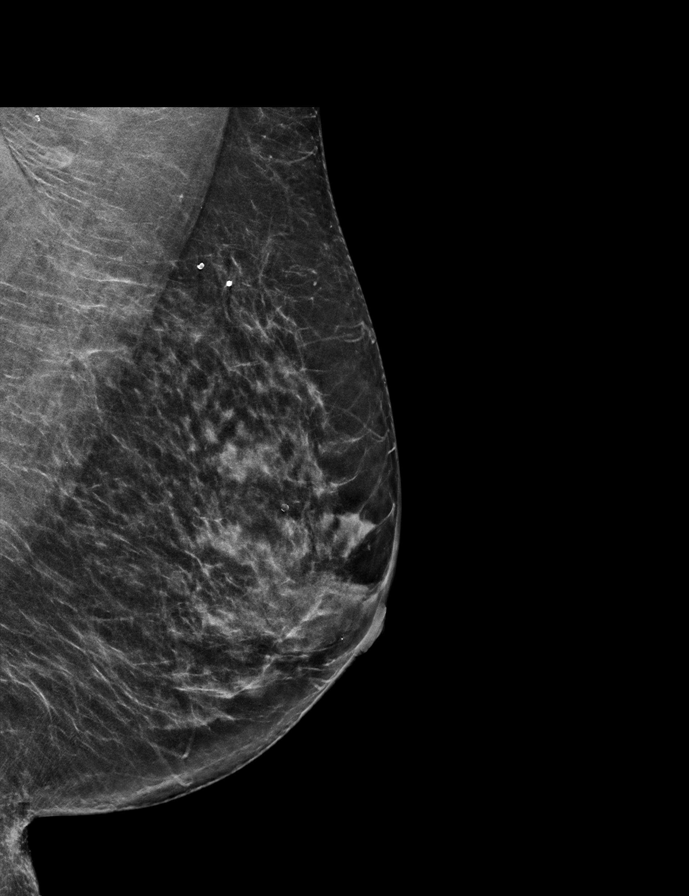

[L CC tomo · 2 of 51 frames shown]
[frame 17/51]
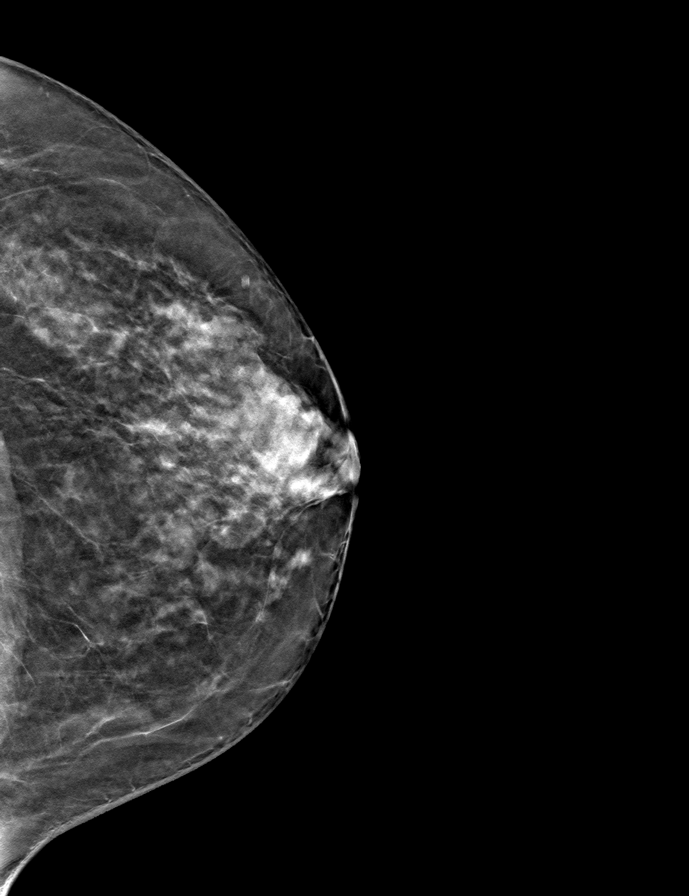
[frame 26/51]
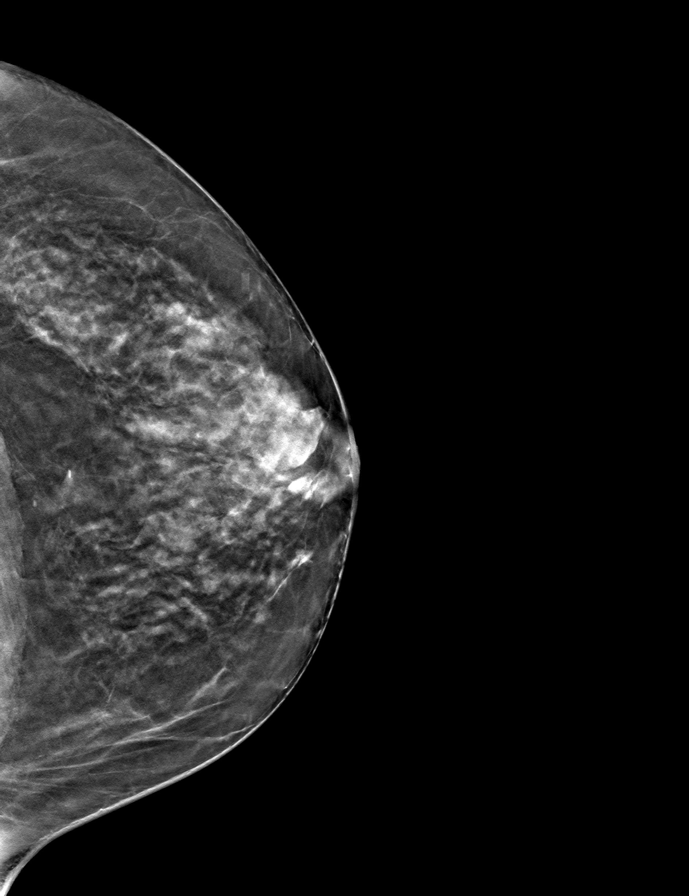

[R MLO tomo · tomo slice 26/51.0]
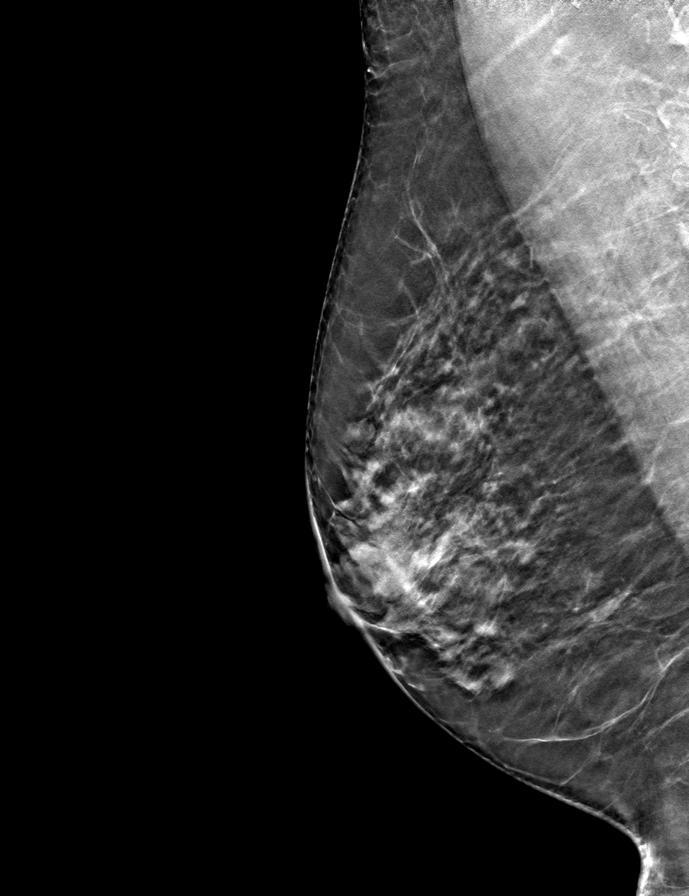

[R CC tomo · tomo slice 27/53.0]
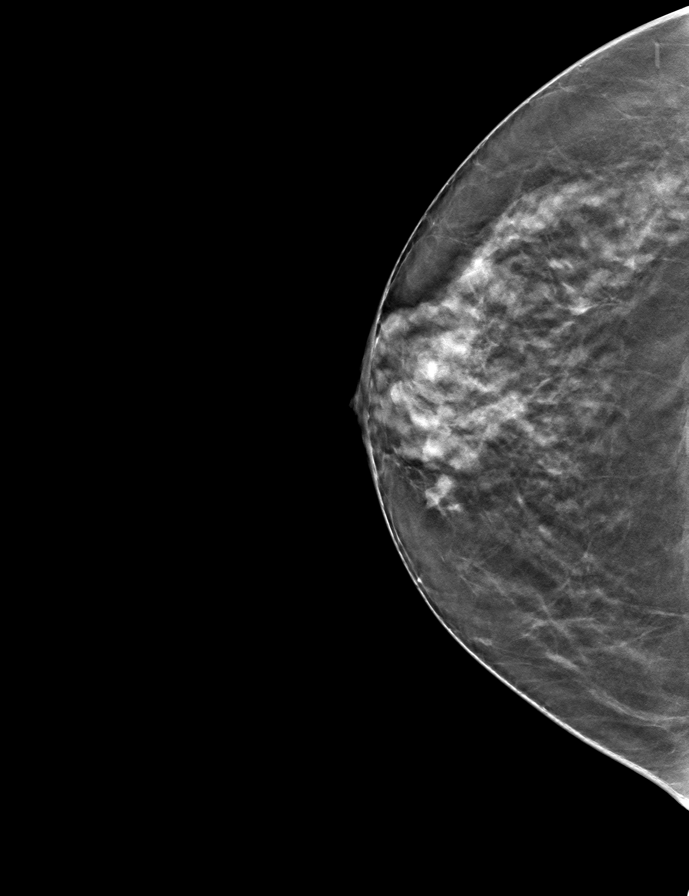

[L MLO tomo · tomo slice 28/55.0]
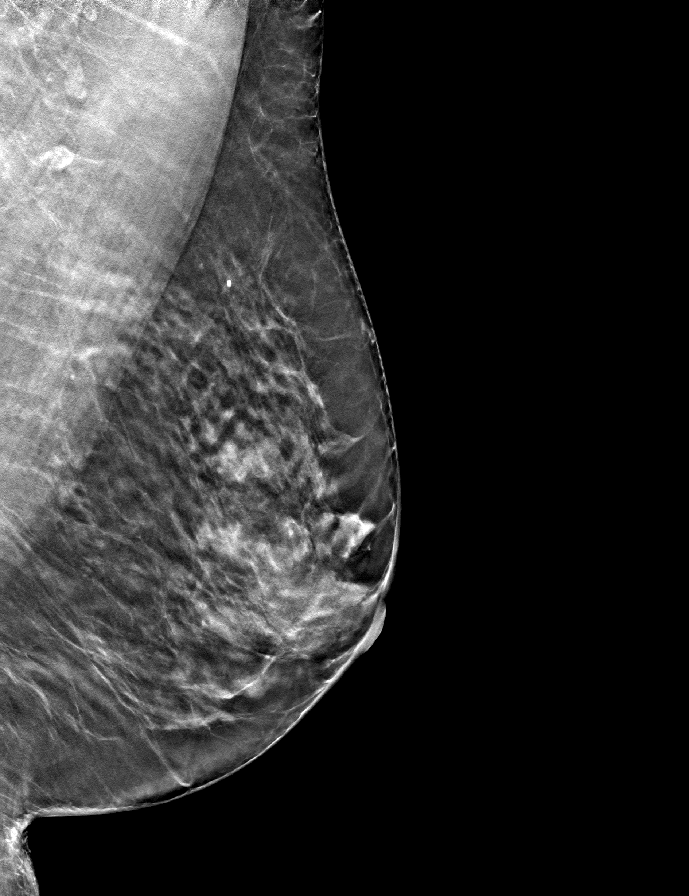

[9 of 24 positions shown; findings below may reference images not displayed]

ACR Breast Density Category b: There are scattered areas of
fibroglandular density.
FINDINGS: There are no findings suspicious for malignancy.
IMPRESSION: No mammographic evidence of malignancy. A result letter of this
screening mammogram will be mailed directly to the patient.

RECOMMENDATION:
Screening mammogram in one year. (Code:51-O-LD2)

BI-RADS CATEGORY  1: Negative.

## 2023-04-26 ENCOUNTER — Other Ambulatory Visit (HOSPITAL_BASED_OUTPATIENT_CLINIC_OR_DEPARTMENT_OTHER): Payer: Self-pay | Admitting: Cardiology

## 2023-04-26 DIAGNOSIS — I517 Cardiomegaly: Secondary | ICD-10-CM

## 2023-05-03 ENCOUNTER — Encounter (HOSPITAL_BASED_OUTPATIENT_CLINIC_OR_DEPARTMENT_OTHER): Payer: Self-pay

## 2023-05-03 DIAGNOSIS — I517 Cardiomegaly: Secondary | ICD-10-CM

## 2023-05-04 MED ORDER — METOPROLOL SUCCINATE ER 25 MG PO TB24
25.0000 mg | ORAL_TABLET | Freq: Every day | ORAL | 3 refills | Status: AC
Start: 2023-05-04 — End: ?

## 2023-05-05 ENCOUNTER — Telehealth: Payer: Self-pay

## 2023-05-05 ENCOUNTER — Ambulatory Visit (AMBULATORY_SURGERY_CENTER): Payer: Medicare HMO

## 2023-05-05 VITALS — Ht 61.0 in | Wt 135.0 lb

## 2023-05-05 DIAGNOSIS — R09A2 Foreign body sensation, throat: Secondary | ICD-10-CM

## 2023-05-05 DIAGNOSIS — Z860100 Personal history of colon polyps, unspecified: Secondary | ICD-10-CM

## 2023-05-05 DIAGNOSIS — R1310 Dysphagia, unspecified: Secondary | ICD-10-CM

## 2023-05-05 MED ORDER — SUFLAVE 178.7 G PO SOLR
1.0000 | ORAL | 0 refills | Status: DC
Start: 2023-05-05 — End: 2023-05-05

## 2023-05-05 MED ORDER — SUFLAVE 178.7 G PO SOLR: 1.0000 | ORAL | 0 refills | Status: DC

## 2023-05-05 NOTE — Progress Notes (Signed)

## 2023-05-05 NOTE — Addendum Note (Signed)
 Addended by: Darylene Price on: 05/05/2023 01:00 PM   Modules accepted: Orders

## 2023-05-05 NOTE — Telephone Encounter (Signed)
 Ok to add EGD for dysphagia and globus sensation. Will need insurance approval. Thanks

## 2023-05-05 NOTE — Telephone Encounter (Signed)
 Dr. Lavon Paganini,   Pt asking if she can have an EGD at the same time as her colonoscopy. She is feeling some increased throat constriction. Her last EGD was in 2021.   Please advise,   Thank you

## 2023-05-22 ENCOUNTER — Encounter: Payer: Medicare HMO | Admitting: Gastroenterology

## 2023-05-27 ENCOUNTER — Encounter: Payer: Self-pay | Admitting: Gastroenterology

## 2023-06-03 ENCOUNTER — Encounter: Payer: Self-pay | Admitting: Gastroenterology

## 2023-06-03 ENCOUNTER — Ambulatory Visit: Payer: Medicare HMO | Admitting: Gastroenterology

## 2023-06-03 VITALS — BP 120/82 | HR 60 | Temp 97.4°F | Resp 12 | Ht 61.0 in | Wt 135.0 lb

## 2023-06-03 DIAGNOSIS — K449 Diaphragmatic hernia without obstruction or gangrene: Secondary | ICD-10-CM

## 2023-06-03 DIAGNOSIS — Z1211 Encounter for screening for malignant neoplasm of colon: Secondary | ICD-10-CM

## 2023-06-03 DIAGNOSIS — K644 Residual hemorrhoidal skin tags: Secondary | ICD-10-CM | POA: Diagnosis not present

## 2023-06-03 DIAGNOSIS — K648 Other hemorrhoids: Secondary | ICD-10-CM

## 2023-06-03 DIAGNOSIS — D122 Benign neoplasm of ascending colon: Secondary | ICD-10-CM | POA: Diagnosis not present

## 2023-06-03 DIAGNOSIS — K5730 Diverticulosis of large intestine without perforation or abscess without bleeding: Secondary | ICD-10-CM

## 2023-06-03 DIAGNOSIS — K222 Esophageal obstruction: Secondary | ICD-10-CM

## 2023-06-03 DIAGNOSIS — R1310 Dysphagia, unspecified: Secondary | ICD-10-CM

## 2023-06-03 DIAGNOSIS — K635 Polyp of colon: Secondary | ICD-10-CM

## 2023-06-03 DIAGNOSIS — Z860100 Personal history of colon polyps, unspecified: Secondary | ICD-10-CM

## 2023-06-03 MED ORDER — SODIUM CHLORIDE 0.9 % IV SOLN
500.0000 mL | Freq: Once | INTRAVENOUS | Status: DC
Start: 2023-06-03 — End: 2023-06-03

## 2023-06-03 NOTE — Patient Instructions (Signed)
 -Handout on polyps anti reflux, hiatal hernia  provided -await pathology results -repeat colonoscopy in 1 year for surveillance recommended.    -Continue present medications  -refer to surgeon Dr. Andrey Campanile for hiatal hernia repair. [facility] will call you to schedule appointment   YOU HAD AN ENDOSCOPIC PROCEDURE TODAY AT THE [facility]:   Refer to the procedure report that was given to you for any specific questions about what was found during the examination.  If the procedure report does not answer your questions, please call your gastroenterologist to clarify.  If you requested that your care partner not be given the details of your procedure findings, then the procedure report has been included in a sealed envelope for you to review at your convenience later.  YOU SHOULD EXPECT: Some feelings of bloating in the abdomen. Passage of more gas than usual.  Walking can help get rid of the air that was put into your GI tract during the procedure and reduce the bloating. If you had a lower endoscopy (such as a colonoscopy or flexible sigmoidoscopy) you may notice spotting of blood in your stool or on the toilet paper. If you underwent a bowel prep for your procedure, you may not have a normal bowel movement for a few days.  Please Note:  You might notice some irritation and congestion in your nose or some drainage.  This is from the oxygen used during your procedure.  There is no need for concern and it should clear up in a day or so.  SYMPTOMS TO REPORT IMMEDIATELY:  Following lower endoscopy (colonoscopy or flexible sigmoidoscopy):  Excessive amounts of blood in the stool  Significant tenderness or worsening of abdominal pains  Swelling of the abdomen that is new, acute  Fever of 100F or higher  Following upper endoscopy (EGD)  Vomiting of blood or coffee ground material  New chest pain or pain under the shoulder blades  Painful or persistently difficult  swallowing  New shortness of breath  Fever of 100F or higher  Black, tarry-looking stools  For urgent or emergent issues, a gastroenterologist can be reached at any hour by calling [phone]. Do not use MyChart messaging for urgent concerns.    DIET:  We do recommend a small meal at first, but then you may proceed to your regular diet.  Drink plenty of fluids but you should avoid alcoholic beverages for 24 hours.  ACTIVITY:  You should plan to take it easy for the rest of today and you should NOT DRIVE or use heavy machinery until tomorrow (because of the sedation medicines used during the test).    FOLLOW UP: Our staff will call the number listed on your records the next business day following your procedure.  We will call around 7:15- 8:00 am to check on you and address any questions or concerns that you may have regarding the information given to you following your procedure. If we do not reach you, we will leave a message.     If any biopsies were taken you will be contacted by phone or by letter within the next 1-3 weeks.  Please call us at [phone] if you have not heard about the biopsies in 3 weeks.    SIGNATURES/CONFIDENTIALITY: You and/or your care partner have signed paperwork which will be entered into your electronic medical record.  These signatures attest to the fact that that the information above on your After Visit Summary has been reviewed and is understood.  Full responsibility of the confidentiality of this discharge information lies with you and/or your care-partner.  Hiatal Hernia  A hiatal hernia occurs when part of the stomach slides above the muscle that separates the abdomen from the chest (diaphragm). A person can be born with a hiatal hernia (congenital), or it may develop over time. In almost all cases of hiatal hernia, only the top part of the stomach pushes through the diaphragm. Many people have a hiatal hernia with no symptoms. The larger the  hernia, the more likely it is that you will have symptoms. In some cases, a hiatal hernia allows stomach acid to flow back into the tube that carries food from your mouth to your stomach (esophagus). This may cause heartburn symptoms. The development of heartburn symptoms may mean that you have a condition called gastroesophageal reflux disease (GERD). What are the causes? This condition is caused by a weakness in the opening (hiatus) where the esophagus passes through the diaphragm to attach to the upper part of the stomach. A person may be born with a weakness in the hiatus, or a weakness can develop over time. What increases the risk? This condition is more likely to develop in: Older people. Age is a major risk factor for a hiatal hernia, especially if you are over the age of 30. Pregnant women. People who are overweight. People who have frequent constipation. What are the signs or symptoms? Symptoms of this condition usually develop in the form of GERD symptoms. Symptoms include: Heartburn. Upset stomach (indigestion). Trouble swallowing. Coughing or wheezing. Wheezing is making high-pitched whistling sounds when you breathe. Sore throat. Chest pain. Nausea and vomiting. How is this diagnosed? This condition may be diagnosed during testing for GERD. Tests that may be done include: X-rays of your stomach or chest. An upper gastrointestinal (GI) series. This is an X-ray exam of your GI tract that is taken after you swallow a chalky liquid that shows up clearly on the X-ray. Endoscopy. This is a procedure to look into your stomach using a thin, flexible tube that has a tiny camera and light on the end of it. How is this treated? This condition may be treated by: Dietary and lifestyle changes to help reduce GERD symptoms. Medicines. These may include: Over-the-counter antacids. Medicines that make your stomach empty more quickly. Medicines that block the production of stomach acid (H2  blockers). Stronger medicines to reduce stomach acid (proton pump inhibitors). Surgery to repair the hernia, if other treatments are not helping. If you have no symptoms, you may not need treatment. Follow these instructions at home: Lifestyle and activity Do not use any products that contain nicotine or tobacco. These products include cigarettes, chewing tobacco, and vaping devices, such as e-cigarettes. If you need help quitting, ask your health care provider. Try to achieve and maintain a healthy body weight. Avoid putting pressure on your abdomen. Anything that puts pressure on your abdomen increases the amount of acid that may be pushed up into your esophagus. Avoid bending over, especially after eating. Raise the head of your bed by putting blocks under the legs. This keeps your head and esophagus higher than your stomach. Do not wear tight clothing around your chest or stomach. Try not to strain when having a bowel movement, when urinating, or when lifting heavy objects. Eating and drinking Avoid foods that can worsen GERD symptoms. These may include: Fatty foods, like fried foods. Citrus fruits, like oranges or lemon. Other foods and drinks that contain acid, like  orange juice or tomatoes. Spicy food. Chocolate. Eat frequent small meals instead of three large meals a day. This helps prevent your stomach from getting too full. Eat slowly. Do not lie down right after eating. Do not eat 1-2 hours before bed. Do not drink beverages with caffeine. These include cola, coffee, cocoa, and tea. Do not drink alcohol. General instructions Take over-the-counter and prescription medicines only as told by your health care provider. Keep all follow-up visits. Your health care provider will want to check that any new prescribed medicines are helping your symptoms. Contact a health care provider if: Your symptoms are not controlled with medicines or lifestyle changes. You are having trouble  swallowing. You have coughing or wheezing that will not go away. Your pain is getting worse. Your pain spreads to your arms, neck, jaw, teeth, or back. You feel nauseous or you vomit. Get help right away if: You have shortness of breath. You vomit blood. You have bright red blood in your stools. You have black, tarry stools. These symptoms may be an emergency. Get help right away. Call 911. Do not wait to see if the symptoms will go away. Do not drive yourself to the hospital. Summary A hiatal hernia occurs when part of the stomach slides above the muscle that separates the abdomen from the chest. A person may be born with a weakness in the hiatus, or a weakness can develop over time. Symptoms of a hiatal hernia may include heartburn, trouble swallowing, or sore throat. Management of a hiatal hernia includes eating frequent small meals instead of three large meals a day. Get help right away if you vomit blood, have bright red blood in your stools, or have black, tarry stools. This information is not intended to replace advice given to you by your health care provider. Make sure you discuss any questions you have with your health care provider. Document Revised: 04/23/2021 Document Reviewed: 04/23/2021 Elsevier Patient Education  2024 Elsevier Inc.   GERD in Adults: Diet Changes When you have gastroesophageal reflux disease (GERD), you may need to make changes to your diet. Choosing the right foods can help with your symptoms. Think about working with an expert in healthy eating called a dietitian. They can help you make healthy food choices. What are tips for following this plan? Reading food labels Look for foods that are low in saturated fat. Foods that may help with your symptoms include: Foods with less than 5% of daily value (DV) of fat. Foods with 0 grams of trans fat. Cooking [employer] in ways that don't use a lot of fat. These ways  include: Baking. Steaming. Grilling. Broiling. To add flavor, try to use herbs that are low in spice and acidity. Avoid frying your food. Meal planning  Eat small meals often rather than eating 3 large meals each day. Eat your meals slowly in a place where you feel relaxed. If told by your health care provider, avoid: Foods that cause symptoms. Keep a food diary to keep track of foods that cause symptoms. Alcohol. Drinking a lot of liquid with meals. General instructions For 2-3 hours after you eat, avoid: Bending over. Exercise. Lying down. Chew sugar-free gum after meals. What foods should I eat? Eat a healthy diet. Try to include: Foods with high amounts of fiber. These include: Fruits and vegetables. Whole grains and beans. Low-fat dairy products. Lean meats, fish, and poultry. Egg whites. Foods that cause symptoms in someone else may not cause symptoms for you.  Work with your provider to find foods that are safe for you. The items listed above may not be all the foods and drinks you can have. Talk with a dietitian to learn more. The items listed above may not be a complete list of foods and beverages you can eat and drink. Contact a dietitian for more information. What foods should I avoid? Limiting some of these foods may help with your symptoms. Each person is different. Talk with a dietitian or your provider to help you find the exact foods to avoid. Some of the foods to avoid may include: Fruits Fruits with a lot of acid in them. These may include citrus fruits, such as oranges, grapefruit, pineapple, and lemons. Vegetables Deep-fried vegetables, such as [place] fries. Vegetables, sauces, or toppings made with added fat and vegetables with acid in them. These may include tomatoes and tomato products, chili peppers, onions, garlic, and horseradish. Grains Pastries or quick breads with added fat. Meats and other proteins High-fat meats, such as fatty beef or pork, hot  dogs, ribs, ham, sausage, salami, and bacon. Fried meat or protein, such as fried fish and fried chicken. Egg yolks. Fats and oils Butter. Margarine. Shortening. Ghee. Drinks Coffee and other drinks with caffeine in them. Fizzy and sugary drinks, such as soda and energy drinks. Fruit juice made with acidic fruits, such as orange or grapefruit. Tomato juice. Sweets and desserts Chocolate and cocoa. Donuts. Seasonings and condiments Mint, such as peppermint and spearmint. Condiments, herbs, or seasonings that cause symptoms. These may include curry, hot sauce, or vinegar-based salad dressings. The items listed above may not be all the foods and drinks you should avoid. Talk with a dietitian to learn more. Questions to ask your health care provider Changes to your diet and everyday life are often the first steps taken to manage symptoms of GERD. If these changes don't help, talk with your provider about taking medicines. Where to find more information [employer] G[employer]intestinal [employer]: aboutgerd.org This information is not intended to replace advice given to you by your health care provider. Make sure you discuss any questions you have with your health care provider. Document Revised: 01/06/2023 Document Reviewed: 07/23/2022 Elsevier Patient Education  2024 [employer].

## 2023-06-03 NOTE — Progress Notes (Unsigned)
 Vss nad trans to pacu

## 2023-06-03 NOTE — Op Note (Signed)
 [facility] Patient Name: Gabrielle Gonzalez Procedure Date: 06/03/2023 1:54 PM MRN: [id number] Endoscopist: Napoleon Form , MD, [id number] Age: 68 Referring MD:  Date of Birth: 1955-06-18 Gender: Female Account #: [id number] Procedure:                Upper GI endoscopy Indications:              Dysphagia Medicines:                Monitored Anesthesia Care Procedure:                Pre-Anesthesia Assessment:                           - Prior to the procedure, a History and Physical                            was performed, and patient medications and                            allergies were reviewed. The patient's tolerance of                            previous anesthesia was also reviewed. The risks                            and benefits of the procedure and the sedation                            options and risks were discussed with the patient.                            All questions were answered, and informed consent                            was obtained. Prior Anticoagulants: The patient has                            taken no anticoagulant or antiplatelet agents. ASA                            Grade Assessment: II - A patient with mild systemic                            disease. After reviewing the risks and benefits,                            the patient was deemed in satisfactory condition to                            undergo the procedure.                           After obtaining informed consent, the endoscope was  passed under direct vision. Throughout the                            procedure, the patient's blood pressure, pulse, and                            oxygen saturations were monitored continuously. The                            Olympus Scope SN [id number] was introduced through the                            mouth, and advanced to the second part of duodenum.                            The upper GI endoscopy was  accomplished without                            difficulty. The patient tolerated the procedure                            well. Scope In: Scope Out: Findings:                 One benign-appearing, intrinsic mild stenosis was                            found 30 to 31 cm from the incisors. This stenosis                            measured less than one cm (in length). The stenosis                            was traversed. A TTS dilator was passed through the                            scope. Dilation with an 18-19-20 mm x 8 cm CRE                            balloon dilator was performed to 20 mm. The                            dilation site was examined following endoscope                            reinsertion and showed mild mucosal disruption.                           An 8 cm hiatal hernia was present.                           The exam of the stomach was otherwise normal.  The examined duodenum was normal. Complications:            No immediate complications. Estimated Blood Loss:     Estimated blood loss was minimal. Impression:               - Benign-appearing esophageal stenosis. Dilated.                           - 8 cm hiatal hernia.                           - Normal examined duodenum.                           - No specimens collected. Recommendation:           - Patient has a contact number available for                            emergencies. The signs and symptoms of potential                            delayed complications were discussed with the                            patient. Return to normal activities tomorrow.                            Written discharge instructions were provided to the                            patient.                           - Resume previous diet.                           - Continue present medications.                           - Follow an antireflux regimen.                           - Refer to a surgeon Dr  Gaynelle Adu for hiatl                            hernia repair at appointment to be scheduled. Napoleon Form, MD 06/03/2023 2:48:03 PM This report has been signed electronically.

## 2023-06-03 NOTE — Progress Notes (Signed)
 Pt's states no medical or surgical changes since previsit or office visit.

## 2023-06-03 NOTE — Op Note (Signed)
 [facility] Patient Name: Gabrielle Gonzalez Procedure Date: 06/03/2023 1:52 PM MRN: [id number] Endoscopist: Napoleon Form , MD, [id number] Age: 68 Referring MD:  Date of Birth: Aug 05, 1955 Gender: Female Account #: [id number] Procedure:                Colonoscopy Indications:              High risk colon cancer surveillance: Personal                            history of adenoma (10 mm or greater in size), High                            risk colon cancer surveillance: Personal history of                            multiple (3 or more) adenomas Medicines:                Monitored Anesthesia Care Procedure:                Pre-Anesthesia Assessment:                           - Prior to the procedure, a History and Physical                            was performed, and patient medications and                            allergies were reviewed. The patient's tolerance of                            previous anesthesia was also reviewed. The risks                            and benefits of the procedure and the sedation                            options and risks were discussed with the patient.                            All questions were answered, and informed consent                            was obtained. Prior Anticoagulants: The patient has                            taken no anticoagulant or antiplatelet agents. ASA                            Grade Assessment: II - A patient with mild systemic                            disease. After reviewing the risks and benefits,  the patient was deemed in satisfactory condition to                            undergo the procedure.                           After obtaining informed consent, the colonoscope                            was passed under direct vision. Throughout the                            procedure, the patient's blood pressure, pulse, and                            oxygen saturations were  monitored continuously. The                            Olympus Scope SN [id number] was introduced through the                            anus and advanced to the the cecum, identified by                            appendiceal orifice and ileocecal valve. The                            Olympus Scope [id number] was introduced through the                            and advanced to the. The colonoscopy was performed                            without difficulty. The patient tolerated the                            procedure well. The quality of the bowel                            preparation was good. The ileocecal valve,                            appendiceal orifice, and rectum were photographed. Scope In: 2:21:08 PM Scope Out: 2:32:34 PM Scope Withdrawal Time: 0 hours 10 minutes 14 seconds  Total Procedure Duration: 0 hours 11 minutes 26 seconds  Findings:                 The perianal and digital rectal examinations were                            normal.                           Scattered medium-mouthed and small-mouthed  diverticula were found in the sigmoid colon and                            descending colon. There was evidence of an impacted                            diverticulum.                           Non-bleeding external and internal hemorrhoids were                            found during retroflexion. The hemorrhoids were                            medium-sized.                           A 20 mm polyp was found in the ascending colon. The                            polyp was sessile. The polyp was removed with a                            cold snare. The polyp was removed with a piecemeal                            technique using a cold snare. Resection and                            retrieval were complete. Complications:            No immediate complications. Estimated Blood Loss:     Estimated blood loss was minimal. Estimated blood                             loss was minimal. Impression:               - Moderate diverticulosis in the sigmoid colon and                            in the descending colon. There was evidence of an                            impacted diverticulum.                           - Non-bleeding external and internal hemorrhoids.                           - One 20 mm polyp in the ascending colon, removed                            with a cold snare and removed piecemeal using a  cold snare. Resected and retrieved. Recommendation:           - Patient has a contact number available for                            emergencies. The signs and symptoms of potential                            delayed complications were discussed with the                            patient. Return to normal activities tomorrow.                            Written discharge instructions were provided to the                            patient.                           - Resume previous diet.                           - Continue present medications.                           - Await pathology results.                           - Repeat colonoscopy in 1 year for surveillance. Napoleon Form, MD 06/03/2023 2:44:48 PM This report has been signed electronically.

## 2023-06-03 NOTE — Progress Notes (Unsigned)
 [facility] History and Physical   Primary Care Physician:  [name], NP   Reason for Procedure:  Dysphagia, h/o colon polyps  Plan:    EGD and colonoscopy with possible interventions as needed     HPI: Gabrielle Gonzalez is a very pleasant 68 y.o. female here for EGD and colonoscopy for dysphagia and h/o colon polyps.   The risks and benefits as well as alternatives of endoscopic procedure(s) have been discussed and reviewed. All questions answered. The patient agrees to proceed.    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   LVH (left ventricular hypertrophy) 04/09/2021   Osteoporosis    Panic disorder without agoraphobia    Rosacea    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   Urinary incontinence    Stress incontinence    Past Surgical History:  Procedure Laterality Date   BREAST BIOPSY Left 2009   CESAREAN SECTION     COLONOSCOPY  2019   DIVERTING ILEOSTOMY N/A 01/09/2022   Procedure: INTRAOPERATIVE ASSESSMENT OF VASCULAR PERFUSION;  Surgeon: Romie Levee, MD;  Location: [facility];  Service: General;  Laterality: N/A;   FOOT SURGERY     ORIF HUMERUS FRACTURE Right 09/04/2016   Procedure: OPEN REDUCTION INTERNAL FIXATION (ORIF) PROXIMAL HUMERUS FRACTURE;  Surgeon: Francena Hanly, MD;  Location: [facility];  Service: Orthopedics;  Laterality: Right;   XI ROBOTIC ASSISTED LOWER ANTERIOR RESECTION N/A 01/09/2022   Procedure: XI ROBOTIC ASSISTED LOW ANTERIOR RESECTION;  Surgeon: Romie Levee, MD;  Location: [facility];   Service: General;  Laterality: N/A;    Prior to Admission medications   Medication Sig Start Date End Date Taking? Authorizing Provider  acetaminophen (TYLENOL) 500 MG tablet Take 1,000-1,500 mg by mouth 2 (two) times daily as needed (pain).   Yes [provider]  B Complex Vitamins (VITAMIN B COMPLEX PO) Take 1 capsule by mouth every morning.   Yes [provider]  Cholecalciferol (VITAMIN D) 50 MCG (2000 UT) tablet Take 2,000 Units by mouth daily.   Yes [provider]  fenofibrate micronized (LOFIBRA) 200 MG capsule Take 200 mg by mouth daily with lunch. 03/28/21  Yes [provider]  Magnesium 400 MG TABS Take 400 mg by mouth daily.   Yes [provider]  metoprolol succinate (TOPROL-XL) 25 MG 24 hr tablet Take 1 tablet (25 mg total) by mouth daily. 05/04/23  Yes [name], MD  Multiple Vitamins-Minerals (ALGAE BASED CALCIUM) TABS Take 1 tablet by mouth daily.   Yes [provider]  Multiple Vitamins-Minerals (OCUVITE ADULT 50+) CAPS Take 1 capsule by mouth daily.   Yes [provider]  omeprazole (PRILOSEC OTC) 20 MG tablet Take 20 mg by mouth 2 (two) times a week.   Yes [provider]  tretinoin (RETIN-A) 0.1 % cream Apply 1 application  topically daily as needed (rosacea). 03/10/18  Yes [provider]  venlafaxine (EFFEXOR) 75 MG tablet Take 75 mg by mouth every morning.   Yes [provider]    Current  Outpatient Medications  Medication Sig Dispense Refill   acetaminophen (TYLENOL) 500 MG tablet Take 1,000-1,500 mg by mouth 2 (two) times daily as needed (pain).     B Complex Vitamins (VITAMIN B COMPLEX PO) Take 1 capsule by mouth every morning.     Cholecalciferol (VITAMIN D) 50 MCG (2000 UT) tablet Take 2,000 Units by mouth daily.     fenofibrate micronized (LOFIBRA) 200 MG capsule Take 200 mg by mouth daily with lunch.     Magnesium 400 MG TABS Take 400 mg by mouth daily.      metoprolol succinate (TOPROL-XL) 25 MG 24 hr tablet Take 1 tablet (25 mg total) by mouth daily. 90 tablet 3   Multiple Vitamins-Minerals (ALGAE BASED CALCIUM) TABS Take 1 tablet by mouth daily.     Multiple Vitamins-Minerals (OCUVITE ADULT 50+) CAPS Take 1 capsule by mouth daily.     omeprazole (PRILOSEC OTC) 20 MG tablet Take 20 mg by mouth 2 (two) times a week.     tretinoin (RETIN-A) 0.1 % cream Apply 1 application  topically daily as needed (rosacea).     venlafaxine (EFFEXOR) 75 MG tablet Take 75 mg by mouth every morning.     Current Facility-Administered Medications  Medication Dose Route Frequency Provider Last Rate Last Admin   0.9 %  sodium chloride infusion  500 mL Intravenous Once Napoleon Form, MD        Allergies as of 06/03/2023 - Review Complete 06/03/2023  Allergen Reaction Noted   Erythromycin Nausea And Vomiting 08/14/2010   Lodine [etodolac] Other (See Comments) 08/14/2010    Family History  Problem Relation Age of Onset   Prostate cancer Father    Diabetes Father    Colon cancer Father    Liver cancer Father        mets from colon   Hypertension Mother    Other Mother        Carcinoid tumor of intestine   Colon cancer Mother    Hypertension Sister    Breast cancer Neg Hx    Colon polyps Neg Hx    Esophageal cancer Neg Hx    Rectal cancer Neg Hx    Stomach cancer Neg Hx     Social History   Socioeconomic History   Marital status: Married    Spouse name: Not on file   Number of children: 2   Years of education: Not on file   Highest education level: Not on file  Occupational History   Occupation: Event organiser: [employer]  Tobacco Use   Smoking status: Never   Smokeless tobacco: Never  Vaping Use   Vaping status: Never Used  Substance and Sexual Activity   Alcohol use: Yes    Comment: very rare   Drug use: No   Sexual activity: Yes    Birth control/protection: Condom  Other Topics Concern   Not on file  Social  History Narrative   Not on file   Social Drivers of Health   Financial Resource Strain: Not on file  Food Insecurity: Low Risk  (12/01/2022)   Received from [facility]   Hunger Vital Sign    Worried About Running Out of Food in the Last Year: Never true    Ran Out of Food in the Last Year: Never true  Transportation Needs: No Transportation Needs (12/01/2022)   Received from [facility]    In the past 12 months, has lack of reliable transportation  kept you from medical appointments, meetings, work or from getting things needed for daily living? : No  Physical Activity: Not on file  Stress: Not on file  Social Connections: Unknown (08/15/2021)   Received from [facility]   Social Network    Social Network: Not on file  Intimate Partner Violence: Not At Risk (01/11/2022)   Humiliation, Afraid, Rape, and Kick questionnaire    Fear of Current or Ex-Partner: No    Emotionally Abused: No    Physically Abused: No    Sexually Abused: No    Review of Systems:  All other review of systems negative except as mentioned in the HPI.  Physical Exam: Vital signs in last 24 hours: BP (!) 147/99   Pulse (!) 59   Temp (!) 97.4 F (36.3 C)   Resp 20   Ht 5\' 1"  (1.549 m)   Wt 135 lb (61.2 kg)   LMP 06/18/2011   SpO2 98%   BMI 25.51 kg/m  General:   Alert, NAD Lungs:  Clear .   Heart:  Regular rate and rhythm Abdomen:  Soft, nontender and nondistended. Neuro/Psych:  Alert and cooperative. Normal mood and affect. A and O x 3  Reviewed labs, radiology imaging, old records and pertinent past GI work up  Patient is appropriate for planned procedure(s) and anesthesia in an ambulatory setting   K. Scherry Ran , MD [phone]

## 2023-06-04 ENCOUNTER — Telehealth: Payer: Self-pay | Admitting: *Deleted

## 2023-06-04 NOTE — Telephone Encounter (Signed)
  Follow up Call-     06/03/2023    1:12 PM  Call back number  Post procedure Call Back phone  # [phone]  Permission to leave phone message Yes     Patient questions:  Do you have a fever, pain , or abdominal swelling? No. Pain Score  0 *  Have you tolerated food without any problems? Yes.    Have you been able to return to your normal activities? Yes.    Do you have any questions about your discharge instructions: Diet   No. Medications  No. Follow up visit  No.  Do you have questions or concerns about your Care? No.  Actions: * If pain score is 4 or above: No action needed, pain <4.

## 2023-06-08 ENCOUNTER — Telehealth: Payer: Self-pay | Admitting: *Deleted

## 2023-06-08 LAB — SURGICAL PATHOLOGY

## 2023-06-08 NOTE — Telephone Encounter (Signed)
 Records faxed today to [facility] Gaynelle Adu

## 2023-06-08 NOTE — Telephone Encounter (Signed)
 Referral to Dr Gaynelle Adu for Hiatal Hernia

## 2023-06-10 ENCOUNTER — Other Ambulatory Visit: Payer: Self-pay | Admitting: Nurse Practitioner

## 2023-06-10 DIAGNOSIS — N6320 Unspecified lump in the left breast, unspecified quadrant: Secondary | ICD-10-CM

## 2023-07-01 NOTE — Telephone Encounter (Signed)
 Called and left a message for patient to see if she has heard from [facility]

## 2023-07-13 ENCOUNTER — Other Ambulatory Visit: Payer: Self-pay | Admitting: General Surgery

## 2023-07-13 DIAGNOSIS — K219 Gastro-esophageal reflux disease without esophagitis: Secondary | ICD-10-CM

## 2023-07-14 NOTE — Telephone Encounter (Signed)
 Patient was seen by Alvah Auerbach on 07/13/2023. Notes from appointment is in  Dr Arcelia Knudsen outbox

## 2023-07-15 ENCOUNTER — Encounter (HOSPITAL_COMMUNITY): Payer: Self-pay

## 2023-07-21 ENCOUNTER — Ambulatory Visit: Payer: Self-pay | Admitting: Gastroenterology

## 2023-07-22 ENCOUNTER — Ambulatory Visit
Admission: RE | Admit: 2023-07-22 | Discharge: 2023-07-22 | Disposition: A | Discharge status: Home / Self Care | Source: Ambulatory Visit | Attending: Nurse Practitioner | Admitting: Nurse Practitioner

## 2023-07-22 DIAGNOSIS — N6320 Unspecified lump in the left breast, unspecified quadrant: Secondary | ICD-10-CM

## 2023-07-23 ENCOUNTER — Other Ambulatory Visit: Payer: Self-pay | Admitting: General Surgery

## 2023-07-23 ENCOUNTER — Ambulatory Visit
Admission: RE | Admit: 2023-07-23 | Discharge: 2023-07-23 | Disposition: A | Discharge status: Home / Self Care | Source: Ambulatory Visit | Attending: General Surgery | Admitting: General Surgery

## 2023-07-23 DIAGNOSIS — K219 Gastro-esophageal reflux disease without esophagitis: Secondary | ICD-10-CM

## 2023-07-24 ENCOUNTER — Ambulatory Visit: Payer: Self-pay | Admitting: General Surgery

## 2023-07-28 ENCOUNTER — Telehealth: Payer: Self-pay | Admitting: *Deleted

## 2023-07-28 ENCOUNTER — Encounter: Payer: Self-pay | Admitting: *Deleted

## 2023-07-28 ENCOUNTER — Other Ambulatory Visit: Payer: Self-pay | Admitting: *Deleted

## 2023-07-28 DIAGNOSIS — R1310 Dysphagia, unspecified: Secondary | ICD-10-CM

## 2023-07-28 DIAGNOSIS — K224 Dyskinesia of esophagus: Secondary | ICD-10-CM

## 2023-07-28 NOTE — Telephone Encounter (Signed)
 Scheduled Esophageal Manometry for August 6 at 8:30 am , Will mail the patient a letter of date snd time and instructions

## 2023-08-04 ENCOUNTER — Telehealth: Payer: Self-pay | Admitting: Gastroenterology

## 2023-08-04 NOTE — Telephone Encounter (Signed)
 Patient called and stated that she is schedule for a Manmoetry for August the 8 th and is wanting to cancel this appointment. Patient also stated that she is already schedule for a manometry for June 11 th with [facility]. Please advise.

## 2023-08-04 NOTE — Telephone Encounter (Signed)
 Patient referred by Dr Elvan Hamel for the esophageal manometry. Canceled her 10/14/23 date as requested.

## 2023-09-23 ENCOUNTER — Encounter (HOSPITAL_BASED_OUTPATIENT_CLINIC_OR_DEPARTMENT_OTHER): Payer: Self-pay

## 2023-10-14 ENCOUNTER — Ambulatory Visit (HOSPITAL_COMMUNITY): Admit: 2023-10-14 | Admitting: Gastroenterology

## 2023-10-14 ENCOUNTER — Encounter (HOSPITAL_COMMUNITY): Payer: Self-pay

## 2023-10-14 SURGERY — MANOMETRY, ESOPHAGUS

## 2023-10-21 NOTE — Progress Notes (Signed)
 Sent message, via epic in basket, requesting orders in epic from Careers adviser.

## 2023-10-23 ENCOUNTER — Ambulatory Visit: Payer: Self-pay | Admitting: General Surgery

## 2023-10-23 ENCOUNTER — Encounter (HOSPITAL_COMMUNITY): Payer: Self-pay

## 2023-10-23 DIAGNOSIS — K449 Diaphragmatic hernia without obstruction or gangrene: Secondary | ICD-10-CM

## 2023-10-23 DIAGNOSIS — K219 Gastro-esophageal reflux disease without esophagitis: Secondary | ICD-10-CM

## 2023-10-23 NOTE — Progress Notes (Addendum)
 PCP - Santana Molt, NP Cardiologist - Bridgette christopher,MD LOV 12-17-22 epic  PPM/ICD -  Device Orders -  Rep Notified -   Chest x-ray -  EKG - 12-17-22 epic Stress Test -  ECHO - 03-13-21 CE Cardiac Cath -  CT coronary-01-02-23  Sleep Study -  CPAP -   Fasting Blood Sugar -  Checks Blood Sugar _____ times a day  Blood Thinner Instructions: Aspirin Instructions:  ERAS Protcol -Y PRE-SURGERY n/a   COVID vaccine -  Activity-- Anesthesia review:   Patient denies shortness of breath, fever, cough and chest pain at PAT appointment   All instructions explained to the patient, with a verbal understanding of the material. Patient agrees to go over the instructions while at home for a better understanding. Patient also instructed to self quarantine after being tested for COVID-19. The opportunity to ask questions was provided.

## 2023-10-23 NOTE — Patient Instructions (Signed)
 SURGICAL WAITING ROOM VISITATION  Patients having surgery or a procedure may have no more than 2 support people in the waiting area - these visitors may rotate.    Children under the age of 36 must have an adult with them who is not the patient.  Visitors with respiratory illnesses are discouraged from visiting and should remain at home.  If the patient needs to stay at the hospital during part of their recovery, the visitor guidelines for inpatient rooms apply. Pre-op nurse will coordinate an appropriate time for 1 support person to accompany patient in pre-op.  This support person may not rotate.    Please refer to the [facility] website for the visitor guidelines for Inpatients (after your surgery is over and you are in a regular room).       Your procedure is scheduled on: 11-02-23   Report to [facility] Main Entrance    Report to admitting at      0515  AM   Call this number if you have problems the morning of surgery [phone]   Do not eat food :After Midnight.   After Midnight you may have the following liquids until _   0430 _____ AM/  DAY OF SURGERY  Water  Non-Citrus Juices (without pulp, NO RED-Apple, White grape, White cranberry) Black Coffee (NO MILK/CREAM OR CREAMERS, sugar ok)  Clear Tea (NO MILK/CREAM OR CREAMERS, sugar ok) regular and decaf                             Plain Jell-O (NO RED)                                           Fruit ices (not with fruit pulp, NO RED)                                     Popsicles (NO RED)                                                               Sports drinks like Gatorade (NO RED)                            If you have questions, please contact your surgeon's office.   FOLLOW BANY ADDITIONAL PRE OP INSTRUCTIONS YOU RECEIVED FROM YOUR SURGEON'S OFFICE!!!     Oral Hygiene is also important to reduce your risk of infection.                                    Remember - BRUSH YOUR TEETH THE MORNING OF  SURGERY WITH YOUR REGULAR TOOTHPASTE  DENTURES WILL BE REMOVED PRIOR TO SURGERY PLEASE DO NOT APPLY Poly grip OR ADHESIVES!!!   Do NOT smoke after Midnight   Stop all vitamins and herbal supplements 7 days before surgery.   Take these medicines the morning of surgery with A SIP OF WATER : effexor , omeprazole , metoprolol   DO NOT TAKE ANY ORAL DIABETIC MEDICATIONS DAY OF YOUR SURGERY  Bring CPAP mask and tubing day of surgery.                              You may not have any metal on your body including hair pins, jewelry, and body piercing             Do not wear make-up, lotions, powders, perfumes/cologne, or deodorant  Do not wear nail polish including gel and S&S, artificial/acrylic nails, or any other type of covering on natural nails including finger and toenails. If you have artificial nails, gel coating, etc. that needs to be removed by a nail salon please have this removed prior to surgery or surgery may need to be canceled/ delayed if the surgeon/ anesthesia feels like they are unable to be safely monitored.   Do not shave  48 hours prior to surgery.           Do not bring valuables to the hospital. [facility] IS NOT             RESPONSIBLE   FOR VALUABLES.   Contacts, glasses, dentures or bridgework may not be worn into surgery.   Bring small overnight bag day of surgery.   DO NOT BRING YOUR HOME MEDICATIONS TO THE HOSPITAL. PHARMACY WILL DISPENSE MEDICATIONS LISTED ON YOUR MEDICATION LIST TO YOU DURING YOUR ADMISSION IN THE HOSPITAL!    Patients discharged on the day of surgery will not be allowed to drive home.  Someone NEEDS to stay with you for the first 24 hours after anesthesia.   Special Instructions: Bring a copy of your healthcare power of attorney and living will documents the day of surgery if you haven't scanned them before.              Please read over the following fact sheets you were given: IF YOU HAVE QUESTIONS ABOUT YOUR PRE-OP INSTRUCTIONS PLEASE  CALL [phone].    If you test positive for Covid or have been in contact with anyone that has tested positive in the last 10 days please notify you surgeon.    [facility] - Preparing for Surgery Before surgery, you can play an important role.  Because skin is not sterile, your skin needs to be as free of germs as possible.  You can reduce the number of germs on your skin by washing with CHG (chlorahexidine gluconate) soap before surgery.  CHG is an antiseptic cleaner which kills germs and bonds with the skin to continue killing germs even after washing. Please DO NOT use if you have an allergy to CHG or antibacterial soaps.  If your skin becomes reddened/irritated stop using the CHG and inform your nurse when you arrive at Short Stay. Do not shave (including legs and underarms) for at least 48 hours prior to the first CHG shower.  You may shave your face/neck. Please follow these instructions carefully:  1.  Shower with CHG Soap the night before surgery and the  morning of Surgery.  2.  If you choose to wash your hair, wash your hair first as usual with your  normal  shampoo.  3.  After you shampoo, rinse your hair and body thoroughly to remove the  shampoo.                           4.  Use  CHG as you would any other liquid soap.  You can apply chg directly  to the skin and wash                       Gently with a scrungie or clean washcloth.  5.  Apply the CHG Soap to your body ONLY FROM THE NECK DOWN.   Do not use on face/ open                           Wound or open sores. Avoid contact with eyes, ears mouth and genitals (private parts).                       Wash face,  Genitals (private parts) with your normal soap.             6.  Wash thoroughly, paying special attention to the area where your surgery  will be performed.  7.  Thoroughly rinse your body with warm water  from the neck down.  8.  DO NOT shower/wash with your normal soap after using and rinsing off  the CHG Soap.                 9.  Pat yourself dry with a clean towel.            10.  Wear clean pajamas.            11.  Place clean sheets on your bed the night of your first shower and do not  sleep with pets. Day of Surgery : Do not apply any lotions/deodorants the morning of surgery.  Please wear clean clothes to the hospital/surgery center.  FAILURE TO FOLLOW THESE INSTRUCTIONS MAY RESULT IN THE CANCELLATION OF YOUR SURGERY PATIENT SIGNATURE_________________________________  NURSE SIGNATURE__________________________________  ________________________________________________________________________

## 2023-10-28 ENCOUNTER — Encounter (HOSPITAL_COMMUNITY): Payer: Self-pay

## 2023-10-28 ENCOUNTER — Encounter (HOSPITAL_COMMUNITY)
Admission: RE | Admit: 2023-10-28 | Discharge: 2023-10-28 | Disposition: A | Discharge status: Home / Self Care | Source: Ambulatory Visit | Attending: General Surgery | Admitting: General Surgery

## 2023-10-28 ENCOUNTER — Other Ambulatory Visit: Payer: Self-pay

## 2023-10-28 DIAGNOSIS — K449 Diaphragmatic hernia without obstruction or gangrene: Secondary | ICD-10-CM | POA: Diagnosis not present

## 2023-10-28 DIAGNOSIS — K219 Gastro-esophageal reflux disease without esophagitis: Secondary | ICD-10-CM | POA: Diagnosis not present

## 2023-10-28 DIAGNOSIS — Z01812 Encounter for preprocedural laboratory examination: Secondary | ICD-10-CM | POA: Insufficient documentation

## 2023-10-28 HISTORY — DX: Prediabetes: R73.03

## 2023-10-28 LAB — CBC WITH DIFFERENTIAL/PLATELET
Abs Immature Granulocytes: 0.01 K/uL (ref 0.00–0.07)
Basophils Absolute: 0.1 K/uL (ref 0.0–0.1)
Basophils Relative: 1 %
Eosinophils Absolute: 0.1 K/uL (ref 0.0–0.5)
Eosinophils Relative: 2 %
HCT: 40.5 % (ref 36.0–46.0)
Hemoglobin: 12.9 g/dL (ref 12.0–15.0)
Immature Granulocytes: 0 %
Lymphocytes Relative: 34 %
Lymphs Abs: 2 K/uL (ref 0.7–4.0)
MCH: 28.9 pg (ref 26.0–34.0)
MCHC: 31.9 g/dL (ref 30.0–36.0)
MCV: 90.6 fL (ref 80.0–100.0)
Monocytes Absolute: 0.7 K/uL (ref 0.1–1.0)
Monocytes Relative: 13 %
Neutro Abs: 2.9 K/uL (ref 1.7–7.7)
Neutrophils Relative %: 50 %
Platelets: 256 K/uL (ref 150–400)
RBC: 4.47 MIL/uL (ref 3.87–5.11)
RDW: 14 % (ref 11.5–15.5)
WBC: 5.8 K/uL (ref 4.0–10.5)
nRBC: 0 % (ref 0.0–0.2)

## 2023-10-28 LAB — COMPREHENSIVE METABOLIC PANEL WITH GFR
ALT: 24 U/L (ref 0–44)
AST: 31 U/L (ref 15–41)
Albumin: 3.8 g/dL (ref 3.5–5.0)
Alkaline Phosphatase: 50 U/L (ref 38–126)
Anion gap: 6 (ref 5–15)
BUN: 19 mg/dL (ref 8–23)
CO2: 27 mmol/L (ref 22–32)
Calcium: 9.8 mg/dL (ref 8.9–10.3)
Chloride: 105 mmol/L (ref 98–111)
Creatinine, Ser: 1.07 mg/dL — ABNORMAL HIGH (ref 0.44–1.00)
GFR, Estimated: 57 mL/min — ABNORMAL LOW (ref >60)
Glucose, Bld: 94 mg/dL (ref 70–99)
Potassium: 4.1 mmol/L (ref 3.5–5.1)
Sodium: 138 mmol/L (ref 135–145)
Total Bilirubin: 0.5 mg/dL (ref 0.0–1.2)
Total Protein: 7.1 g/dL (ref 6.5–8.1)

## 2023-11-01 NOTE — H&P (Signed)
 CC: here for surgery  Requesting provider: Dr Shila  HPI: Gabrielle Gonzalez is an 68 y.o. female who is here for robotic repair of hiatal hernia with possible mesh, possible fundoplication, upper endoscopy. Denies changes since seen  She underwent manometry at [facility] which showed normal peristalsis, she cleared 7 swallow  Old hpi: Gabrielle Gonzalez is a 68 year old female with a large hiatal hernia who presents for evaluation and management. She was referred by Dr. Nandigam for evaluation of her large hiatal hernia.  She experiences occasional heartburn, which is managed with omeprazole  taken twice a week and generic Tums as needed. She previously took omeprazole  daily but reduced the frequency due to concerns about long-term use. Caffeine, large meals, and tomato sauce exacerbate her symptoms, while avoiding these helps reduce heartburn.  She occasionally experiences a sensation of food getting stuck, particularly with solids, occurring a couple of times a month. This is usually resolved by drinking more liquid. No significant pain with swallowing and infrequent vomiting unless she has a severe coughing fit or is ill. She describes having a 'weak stomach' and is prone to stomach issues when exposed to others with similar conditions.  She sometimes sleeps with her head elevated to alleviate heartburn, especially when symptoms are bothersome. Her bowel movements are irregular, alternating between loose and hard stools, but she has a daily bowel movement. She had a colon resection a year and a half ago due to diverticulitis, which was performed robotically and went well.  She was diagnosed with a heart murmur two to three years ago and underwent a cardiac CT around that time. No sour or bitter taste in her mouth, frequent burping, or belching.   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   Urinary incontinence    Stress incontinence    Past Surgical History:  Procedure Laterality Date   BREAST BIOPSY Left 2009   CESAREAN SECTION     COLONOSCOPY  2019   DIVERTING ILEOSTOMY N/A 01/09/2022   Procedure: INTRAOPERATIVE ASSESSMENT OF VASCULAR PERFUSION;  Surgeon: Debby Hila, MD;  Location: [facility];  Service: General;  Laterality: N/A;   FOOT SURGERY Bilateral    ORIF HUMERUS FRACTURE Right 09/04/2016   Procedure: OPEN REDUCTION INTERNAL FIXATION (ORIF) PROXIMAL HUMERUS FRACTURE;  Surgeon: Melita Drivers, MD;  Location: [facility];  Service: Orthopedics;  Laterality: Right;   XI ROBOTIC ASSISTED LOWER ANTERIOR RESECTION N/A 01/09/2022   Procedure: XI ROBOTIC ASSISTED LOW ANTERIOR RESECTION;  Surgeon: Debby Hila, MD;  Location: [facility];  Service: General;  Laterality: N/A;    Family History  Problem Relation Age of Onset   Prostate cancer Father    Diabetes Father    Colon cancer Father    Liver cancer Father        mets from colon   Hypertension Mother    Other Mother  Carcinoid tumor of intestine   Colon cancer Mother    Hypertension Sister    Breast cancer Neg Hx    Colon polyps Neg Hx    Esophageal cancer Neg Hx    Rectal cancer Neg Hx    Stomach cancer Neg Hx     Social:  reports that she has never smoked. She has never used smokeless tobacco. She reports current alcohol use. She reports that she does not use drugs.  Allergies:  Allergies  Allergen Reactions   Erythromycin  Nausea And Vomiting   Lodine [Etodolac] Other (See Comments)    GI burning     Medications: I have reviewed the patient's current medications.   ROS - all of the below systems have been reviewed with the patient and positives are indicated with bold text General: chills, fever or night sweats Eyes: blurry vision or double vision ENT: epistaxis or sore throat Allergy/Immunology: itchy/watery eyes or nasal congestion Hematologic/Lymphatic: bleeding problems, blood clots or swollen lymph nodes Endocrine: temperature intolerance or unexpected weight changes Breast: new or changing breast lumps or nipple discharge Resp: cough, shortness of breath, or wheezing CV: chest pain or dyspnea on exertion GI: as per HPI GU: dysuria, trouble voiding, or hematuria MSK: joint pain or joint stiffness Neuro: TIA or stroke symptoms Derm: pruritus and skin lesion changes Psych: anxiety and depression  PE Last menstrual period 06/18/2011. Constitutional: NAD; conversant; no deformities Eyes: Moist conjunctiva; no lid lag; anicteric; PERRL Neck: Trachea midline; no thyromegaly Lungs: Normal respiratory effort; no tactile fremitus CV: RRR; no palpable thrills; no pitting edema GI: Abd soft nt; no palpable hepatosplenomegaly MSK: Normal gait; no clubbing/cyanosis Psychiatric: Appropriate affect; alert and oriented x3 Lymphatic: No palpable cervical or axillary lymphadenopathy Skin:no rash  No results found for this or any previous visit (from the past 48 hours).  No results found.  Imaging: EGD 05/2023  Upper gi 07/23/23  IMPRESSION: Moderate hiatal hernia, with approximately 1/3 of the stomach positioned in the lower chest.   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  Manometry at [facility] 08/2023   A/P: Gabrielle Gonzalez is an 68 y.o. female with  Large hiatal hernia History of Ole ulcers Intermittent GERD   2 OR for robotic assisted hiatal  hernia repair with possible mesh, possible fundoplication, upper endoscopy IV antibiotic on-call Enhanced recovery protocol Subcutaneous heparin  preoperatively   Camellia HERO. Tanda, MD, FACS General, Bariatric, & Minimally Invasive Surgery [facility]

## 2023-11-02 ENCOUNTER — Ambulatory Visit (HOSPITAL_COMMUNITY)
Admission: RE | Admit: 2023-11-02 | Discharge: 2023-11-03 | Disposition: A | Discharge status: Home / Self Care | Attending: General Surgery | Admitting: General Surgery

## 2023-11-02 ENCOUNTER — Ambulatory Visit (HOSPITAL_BASED_OUTPATIENT_CLINIC_OR_DEPARTMENT_OTHER): Admitting: Anesthesiology

## 2023-11-02 ENCOUNTER — Encounter (HOSPITAL_COMMUNITY)
Admission: RE | Disposition: A | Discharge status: Home / Self Care | Payer: Self-pay | Source: Home / Self Care | Attending: General Surgery

## 2023-11-02 ENCOUNTER — Encounter (HOSPITAL_COMMUNITY): Payer: Self-pay | Admitting: General Surgery

## 2023-11-02 ENCOUNTER — Ambulatory Visit (HOSPITAL_COMMUNITY): Payer: Self-pay | Admitting: Medical

## 2023-11-02 ENCOUNTER — Other Ambulatory Visit: Payer: Self-pay

## 2023-11-02 DIAGNOSIS — K449 Diaphragmatic hernia without obstruction or gangrene: Secondary | ICD-10-CM

## 2023-11-02 DIAGNOSIS — E785 Hyperlipidemia, unspecified: Secondary | ICD-10-CM | POA: Insufficient documentation

## 2023-11-02 DIAGNOSIS — Z9889 Other specified postprocedural states: Secondary | ICD-10-CM | POA: Diagnosis present

## 2023-11-02 DIAGNOSIS — Z8719 Personal history of other diseases of the digestive system: Secondary | ICD-10-CM

## 2023-11-02 DIAGNOSIS — I10 Essential (primary) hypertension: Secondary | ICD-10-CM | POA: Diagnosis not present

## 2023-11-02 DIAGNOSIS — R7303 Prediabetes: Secondary | ICD-10-CM | POA: Insufficient documentation

## 2023-11-02 DIAGNOSIS — K219 Gastro-esophageal reflux disease without esophagitis: Secondary | ICD-10-CM | POA: Insufficient documentation

## 2023-11-02 HISTORY — PX: XI ROBOTIC ASSISTED HIATAL HERNIA REPAIR: SHX6889

## 2023-11-02 HISTORY — PX: UPPER GI ENDOSCOPY: SHX6162

## 2023-11-02 LAB — TYPE AND SCREEN
ABO/RH(D): A POS
Antibody Screen: NEGATIVE

## 2023-11-02 SURGERY — REPAIR, HERNIA, HIATAL, ROBOT-ASSISTED
Anesthesia: General

## 2023-11-02 MED ORDER — DEXAMETHASONE SODIUM PHOSPHATE 10 MG/ML IJ SOLN
INTRAMUSCULAR | Status: AC
  Filled 2023-11-02: qty 1

## 2023-11-02 MED ORDER — 0.9 % SODIUM CHLORIDE (POUR BTL) OPTIME
TOPICAL | Status: DC | PRN
  Administered 2023-11-02: 1000 mL

## 2023-11-02 MED ORDER — KCL IN DEXTROSE-NACL 20-5-0.45 MEQ/L-%-% IV SOLN
INTRAVENOUS | Status: AC
  Filled 2023-11-02: qty 1000

## 2023-11-02 MED ORDER — ROCURONIUM BROMIDE 10 MG/ML (PF) SYRINGE
PREFILLED_SYRINGE | INTRAVENOUS | Status: DC | PRN
  Administered 2023-11-02: 10 mg via INTRAVENOUS
  Administered 2023-11-02: 50 mg via INTRAVENOUS
  Administered 2023-11-02: 20 mg via INTRAVENOUS

## 2023-11-02 MED ORDER — GLYCOPYRROLATE 0.2 MG/ML IJ SOLN
INTRAMUSCULAR | Status: AC
  Filled 2023-11-02: qty 1

## 2023-11-02 MED ORDER — SUCCINYLCHOLINE CHLORIDE 200 MG/10ML IV SOSY
PREFILLED_SYRINGE | INTRAVENOUS | Status: AC
  Filled 2023-11-02: qty 10

## 2023-11-02 MED ORDER — ACETAMINOPHEN 500 MG PO TABS
1000.0000 mg | ORAL_TABLET | ORAL | Status: AC
  Administered 2023-11-02: 1000 mg via ORAL
  Filled 2023-11-02: qty 2

## 2023-11-02 MED ORDER — ONDANSETRON HCL 4 MG/2ML IJ SOLN: 4.0000 mg | Freq: Four times a day (QID) | INTRAMUSCULAR | Status: DC | PRN

## 2023-11-02 MED ORDER — EPHEDRINE SULFATE-NACL 50-0.9 MG/10ML-% IV SOSY
PREFILLED_SYRINGE | INTRAVENOUS | Status: DC | PRN
  Administered 2023-11-02 (×2): 2.5 mg via INTRAVENOUS

## 2023-11-02 MED ORDER — ROCURONIUM BROMIDE 10 MG/ML (PF) SYRINGE
PREFILLED_SYRINGE | INTRAVENOUS | Status: AC
  Filled 2023-11-02: qty 10

## 2023-11-02 MED ORDER — ONDANSETRON HCL 4 MG/2ML IJ SOLN
INTRAMUSCULAR | Status: AC
  Filled 2023-11-02: qty 2

## 2023-11-02 MED ORDER — KCL IN DEXTROSE-NACL 20-5-0.45 MEQ/L-%-% IV SOLN
INTRAVENOUS | Status: AC
  Administered 2023-11-02: 75 mL/h via INTRAVENOUS
  Filled 2023-11-02: qty 1000

## 2023-11-02 MED ORDER — PROPOFOL 10 MG/ML IV BOLUS
INTRAVENOUS | Status: AC
  Filled 2023-11-02: qty 20

## 2023-11-02 MED ORDER — DIPHENHYDRAMINE HCL 50 MG/ML IJ SOLN: 12.5000 mg | Freq: Four times a day (QID) | INTRAMUSCULAR | Status: DC | PRN

## 2023-11-02 MED ORDER — GLYCOPYRROLATE 0.2 MG/ML IJ SOLN
INTRAMUSCULAR | Status: DC | PRN
  Administered 2023-11-02: .2 mg via INTRAVENOUS

## 2023-11-02 MED ORDER — BUPIVACAINE LIPOSOME 1.3 % IJ SUSP
INTRAMUSCULAR | Status: AC
Start: 2023-11-02 — End: 2023-11-02
  Filled 2023-11-02: qty 20

## 2023-11-02 MED ORDER — DIPHENHYDRAMINE HCL 12.5 MG/5ML PO ELIX: 12.5000 mg | ORAL_SOLUTION | Freq: Four times a day (QID) | ORAL | Status: DC | PRN

## 2023-11-02 MED ORDER — KETOROLAC TROMETHAMINE 30 MG/ML IJ SOLN
INTRAMUSCULAR | Status: DC | PRN
  Administered 2023-11-02: 15 mg via INTRAVENOUS

## 2023-11-02 MED ORDER — SUCCINYLCHOLINE CHLORIDE 200 MG/10ML IV SOSY
PREFILLED_SYRINGE | INTRAVENOUS | Status: DC | PRN
  Administered 2023-11-02: 120 mg via INTRAVENOUS

## 2023-11-02 MED ORDER — EPHEDRINE 5 MG/ML INJ
INTRAVENOUS | Status: AC
  Filled 2023-11-02: qty 5

## 2023-11-02 MED ORDER — FENTANYL CITRATE (PF) 100 MCG/2ML IJ SOLN
INTRAMUSCULAR | Status: DC | PRN
  Administered 2023-11-02: 100 ug via INTRAVENOUS
  Administered 2023-11-02 (×2): 50 ug via INTRAVENOUS

## 2023-11-02 MED ORDER — CHLORHEXIDINE GLUCONATE 4 % EX SOLN: 60.0000 mL | Freq: Once | CUTANEOUS | Status: DC

## 2023-11-02 MED ORDER — HEPARIN SODIUM (PORCINE) 5000 UNIT/ML IJ SOLN
5000.0000 [IU] | Freq: Once | INTRAMUSCULAR | Status: AC
  Administered 2023-11-02: 5000 [IU] via SUBCUTANEOUS
  Filled 2023-11-02: qty 1

## 2023-11-02 MED ORDER — SODIUM CHLORIDE 0.9 % IV SOLN: 12.5000 mg | Freq: Four times a day (QID) | INTRAVENOUS | Status: DC | PRN

## 2023-11-02 MED ORDER — OXYCODONE HCL 5 MG/5ML PO SOLN: 5.0000 mg | Freq: Once | ORAL | Status: AC | PRN

## 2023-11-02 MED ORDER — MIDAZOLAM HCL 5 MG/5ML IJ SOLN
INTRAMUSCULAR | Status: DC | PRN
  Administered 2023-11-02: 2 mg via INTRAVENOUS

## 2023-11-02 MED ORDER — ENOXAPARIN SODIUM 40 MG/0.4ML IJ SOSY
40.0000 mg | PREFILLED_SYRINGE | INTRAMUSCULAR | Status: DC
  Administered 2023-11-03: 40 mg via SUBCUTANEOUS
  Filled 2023-11-02: qty 0.4

## 2023-11-02 MED ORDER — KETOROLAC TROMETHAMINE 30 MG/ML IJ SOLN
INTRAMUSCULAR | Status: AC
  Filled 2023-11-02: qty 1

## 2023-11-02 MED ORDER — MIDAZOLAM HCL 2 MG/2ML IJ SOLN
INTRAMUSCULAR | Status: AC
  Filled 2023-11-02: qty 2

## 2023-11-02 MED ORDER — SODIUM CHLORIDE 0.9 % IV SOLN: 12.5000 mg | INTRAVENOUS | Status: DC | PRN

## 2023-11-02 MED ORDER — ACETAMINOPHEN 500 MG PO TABS
1000.0000 mg | ORAL_TABLET | Freq: Four times a day (QID) | ORAL | Status: DC
  Administered 2023-11-02 – 2023-11-03 (×4): 1000 mg via ORAL
  Filled 2023-11-02 (×4): qty 2

## 2023-11-02 MED ORDER — OXYCODONE HCL 5 MG PO TABS
5.0000 mg | ORAL_TABLET | Freq: Once | ORAL | Status: AC | PRN
  Administered 2023-11-02: 5 mg via ORAL

## 2023-11-02 MED ORDER — OXYCODONE HCL 5 MG PO TABS
ORAL_TABLET | ORAL | Status: AC
  Filled 2023-11-02: qty 1

## 2023-11-02 MED ORDER — BUPIVACAINE-EPINEPHRINE (PF) 0.25% -1:200000 IJ SOLN
INTRAMUSCULAR | Status: AC
  Filled 2023-11-02: qty 30

## 2023-11-02 MED ORDER — CHLORHEXIDINE GLUCONATE 0.12 % MT SOLN
15.0000 mL | Freq: Once | OROMUCOSAL | Status: AC
  Administered 2023-11-02: 15 mL via OROMUCOSAL

## 2023-11-02 MED ORDER — MORPHINE SULFATE (PF) 2 MG/ML IV SOLN: 1.0000 mg | INTRAVENOUS | Status: DC | PRN

## 2023-11-02 MED ORDER — HYDROMORPHONE HCL 1 MG/ML IJ SOLN: 0.2500 mg | INTRAMUSCULAR | Status: DC | PRN

## 2023-11-02 MED ORDER — METOPROLOL SUCCINATE ER 25 MG PO TB24
25.0000 mg | ORAL_TABLET | Freq: Every day | ORAL | Status: DC
  Administered 2023-11-03: 25 mg via ORAL
  Filled 2023-11-02: qty 1

## 2023-11-02 MED ORDER — LACTATED RINGERS IR SOLN
Status: DC | PRN
Start: 2023-11-02 — End: 2023-11-02
  Administered 2023-11-02: 1000 mL

## 2023-11-02 MED ORDER — SIMETHICONE 80 MG PO CHEW: 40.0000 mg | CHEWABLE_TABLET | Freq: Four times a day (QID) | ORAL | Status: DC | PRN

## 2023-11-02 MED ORDER — OXYCODONE HCL 5 MG PO TABS: 5.0000 mg | ORAL_TABLET | ORAL | Status: DC | PRN

## 2023-11-02 MED ORDER — DEXAMETHASONE SODIUM PHOSPHATE 10 MG/ML IJ SOLN: 4.0000 mg | INTRAMUSCULAR | Status: DC

## 2023-11-02 MED ORDER — PHENYLEPHRINE HCL-NACL 20-0.9 MG/250ML-% IV SOLN
INTRAVENOUS | Status: DC | PRN
  Administered 2023-11-02: 40 ug/min via INTRAVENOUS

## 2023-11-02 MED ORDER — BUPIVACAINE-EPINEPHRINE 0.25% -1:200000 IJ SOLN
INTRAMUSCULAR | Status: DC | PRN
  Administered 2023-11-02: 30 mL

## 2023-11-02 MED ORDER — ONDANSETRON 4 MG PO TBDP: 4.0000 mg | ORAL_TABLET | Freq: Four times a day (QID) | ORAL | Status: DC | PRN

## 2023-11-02 MED ORDER — VENLAFAXINE HCL 75 MG PO TABS
75.0000 mg | ORAL_TABLET | Freq: Every morning | ORAL | Status: DC
  Administered 2023-11-03: 75 mg via ORAL
  Filled 2023-11-02: qty 1

## 2023-11-02 MED ORDER — SUGAMMADEX SODIUM 200 MG/2ML IV SOLN
INTRAVENOUS | Status: DC | PRN
  Administered 2023-11-02: 200 mg via INTRAVENOUS

## 2023-11-02 MED ORDER — FENTANYL CITRATE (PF) 100 MCG/2ML IJ SOLN
INTRAMUSCULAR | Status: AC
Start: 2023-11-02 — End: 2023-11-02
  Filled 2023-11-02: qty 2

## 2023-11-02 MED ORDER — AMISULPRIDE (ANTIEMETIC) 5 MG/2ML IV SOLN: 10.0000 mg | Freq: Once | INTRAVENOUS | Status: DC | PRN

## 2023-11-02 MED ORDER — SUGAMMADEX SODIUM 200 MG/2ML IV SOLN
INTRAVENOUS | Status: AC
  Filled 2023-11-02: qty 2

## 2023-11-02 MED ORDER — DEXAMETHASONE SODIUM PHOSPHATE 10 MG/ML IJ SOLN
INTRAMUSCULAR | Status: DC | PRN
  Administered 2023-11-02: 5 mg via INTRAVENOUS

## 2023-11-02 MED ORDER — ARTIFICIAL TEARS OPHTHALMIC OINT
TOPICAL_OINTMENT | OPHTHALMIC | Status: AC
  Filled 2023-11-02: qty 3.5

## 2023-11-02 MED ORDER — MELATONIN 3 MG PO TABS: 3.0000 mg | ORAL_TABLET | Freq: Every evening | ORAL | Status: DC | PRN

## 2023-11-02 MED ORDER — LIDOCAINE HCL (PF) 2 % IJ SOLN
INTRAMUSCULAR | Status: DC | PRN
Start: 2023-11-02 — End: 2023-11-02
  Administered 2023-11-02: 60 mg via INTRADERMAL

## 2023-11-02 MED ORDER — CEFAZOLIN SODIUM-DEXTROSE 2-4 GM/100ML-% IV SOLN
2.0000 g | INTRAVENOUS | Status: AC
  Administered 2023-11-02: 2 g via INTRAVENOUS
  Filled 2023-11-02: qty 100

## 2023-11-02 MED ORDER — PANTOPRAZOLE SODIUM 40 MG IV SOLR
40.0000 mg | Freq: Two times a day (BID) | INTRAVENOUS | Status: DC
  Administered 2023-11-02 – 2023-11-03 (×2): 40 mg via INTRAVENOUS
  Filled 2023-11-02 (×2): qty 10

## 2023-11-02 MED ORDER — LACTATED RINGERS IV SOLN: INTRAVENOUS | Status: DC

## 2023-11-02 MED ORDER — MEPERIDINE HCL 100 MG/ML IJ SOLN: 6.2500 mg | INTRAMUSCULAR | Status: DC | PRN

## 2023-11-02 MED ORDER — ORAL CARE MOUTH RINSE: 15.0000 mL | Freq: Once | OROMUCOSAL | Status: AC

## 2023-11-02 MED ORDER — FENTANYL CITRATE (PF) 100 MCG/2ML IJ SOLN
INTRAMUSCULAR | Status: AC
  Filled 2023-11-02: qty 2

## 2023-11-02 MED ORDER — LIDOCAINE HCL (PF) 2 % IJ SOLN
INTRAMUSCULAR | Status: AC
  Filled 2023-11-02: qty 5

## 2023-11-02 MED ORDER — ONDANSETRON HCL 4 MG/2ML IJ SOLN
INTRAMUSCULAR | Status: DC | PRN
  Administered 2023-11-02: 4 mg via INTRAVENOUS

## 2023-11-02 MED ORDER — SCOPOLAMINE 1 MG/3DAYS TD PT72
1.0000 | MEDICATED_PATCH | TRANSDERMAL | Status: DC
  Administered 2023-11-02: 1 mg via TRANSDERMAL
  Filled 2023-11-02: qty 1

## 2023-11-02 MED ORDER — METHOCARBAMOL 500 MG PO TABS: 500.0000 mg | ORAL_TABLET | Freq: Four times a day (QID) | ORAL | Status: DC | PRN

## 2023-11-02 MED ORDER — PROPOFOL 10 MG/ML IV BOLUS
INTRAVENOUS | Status: DC | PRN
Start: 2023-11-02 — End: 2023-11-02
  Administered 2023-11-02: 20 mg via INTRAVENOUS
  Administered 2023-11-02: 30 mg via INTRAVENOUS
  Administered 2023-11-02: 150 mg via INTRAVENOUS

## 2023-11-02 SURGICAL SUPPLY — 60 items
BLADE SURG SZ11 CARB STEEL (BLADE) ×1 IMPLANT
CHLORAPREP W/TINT 26 (MISCELLANEOUS) ×1 IMPLANT
CLIP APPLIE 5 13 M/L LIGAMAX5 (MISCELLANEOUS) IMPLANT
CLIP APPLIE ROT 10 11.4 M/L (STAPLE) IMPLANT
CLSR STERI-STRIP ANTIMIC 1/2X4 (GAUZE/BANDAGES/DRESSINGS) IMPLANT
COVER SURGICAL LIGHT HANDLE (MISCELLANEOUS) ×1 IMPLANT
COVER TIP SHEARS 8 DVNC (MISCELLANEOUS) IMPLANT
DERMABOND ADVANCED .7 DNX12 (GAUZE/BANDAGES/DRESSINGS) IMPLANT
DRAIN PENROSE 0.5X18 (DRAIN) IMPLANT
DRAPE ARM DVNC X/XI (DISPOSABLE) ×4 IMPLANT
DRAPE COLUMN DVNC XI (DISPOSABLE) ×1 IMPLANT
DRIVER NDL LRG 8 DVNC XI (INSTRUMENTS) ×1 IMPLANT
DRIVER NDL MEGA SUTCUT DVNCXI (INSTRUMENTS) ×1 IMPLANT
DRIVER NDLE LRG 8 DVNC XI (INSTRUMENTS) ×1 IMPLANT
DRIVER NDLE MEGA SUTCUT DVNCXI (INSTRUMENTS) ×1 IMPLANT
DRSG TEGADERM 2-3/8X2-3/4 SM (GAUZE/BANDAGES/DRESSINGS) ×6 IMPLANT
ELECT REM PT RETURN 15FT ADLT (MISCELLANEOUS) ×1 IMPLANT
FORCEPS CADIERE DVNC XI (FORCEP) IMPLANT
GAUZE 4X4 16PLY ~~LOC~~+RFID DBL (SPONGE) ×1 IMPLANT
GAUZE SPONGE 2X2 8PLY STRL LF (GAUZE/BANDAGES/DRESSINGS) ×1 IMPLANT
GLOVE BIO SURGEON STRL SZ7.5 (GLOVE) ×2 IMPLANT
GLOVE INDICATOR 8.0 STRL GRN (GLOVE) ×2 IMPLANT
GOWN STRL REUS W/ TWL XL LVL3 (GOWN DISPOSABLE) ×2 IMPLANT
GRASPER SUT TROCAR 14GX15 (MISCELLANEOUS) IMPLANT
GRASPER TIP-UP FEN DVNC XI (INSTRUMENTS) ×1 IMPLANT
IRRIGATION SUCT STRKRFLW 2 WTP (MISCELLANEOUS) ×1 IMPLANT
KIT BASIN OR (CUSTOM PROCEDURE TRAY) ×1 IMPLANT
KIT GASTRIC LAVAGE 34FR ADT (SET/KITS/TRAYS/PACK) IMPLANT
KIT TURNOVER KIT A (KITS) ×1 IMPLANT
LUBRICANT JELLY K Y 4OZ (MISCELLANEOUS) IMPLANT
MARKER SKIN DUAL TIP RULER LAB (MISCELLANEOUS) IMPLANT
NDL HYPO 22X1.5 SAFETY MO (MISCELLANEOUS) ×1 IMPLANT
NEEDLE HYPO 22X1.5 SAFETY MO (MISCELLANEOUS) ×1 IMPLANT
OBTURATOR OPTICALSTD 8 DVNC (TROCAR) ×1 IMPLANT
PACK CARDIOVASCULAR III (CUSTOM PROCEDURE TRAY) ×1 IMPLANT
PAD POSITIONING PINK XL (MISCELLANEOUS) ×1 IMPLANT
SCISSORS LAP 5X35 DISP (ENDOMECHANICALS) IMPLANT
SCISSORS MNPLR CVD DVNC XI (INSTRUMENTS) ×1 IMPLANT
SEAL UNIV 5-12 XI (MISCELLANEOUS) ×3 IMPLANT
SEALER VESSEL EXT DVNC XI (MISCELLANEOUS) ×1 IMPLANT
SOLUTION ANTFG W/FOAM PAD STRL (MISCELLANEOUS) ×1 IMPLANT
SOLUTION ELECTROSURG ANTI STCK (MISCELLANEOUS) ×1 IMPLANT
SPIKE FLUID TRANSFER (MISCELLANEOUS) ×1 IMPLANT
STRIP CLOSURE SKIN 1/2X4 (GAUZE/BANDAGES/DRESSINGS) ×1 IMPLANT
SUT ETHIBOND 0 36 GRN (SUTURE) ×3 IMPLANT
SUT MNCRL AB 4-0 PS2 18 (SUTURE) ×1 IMPLANT
SUT SILK 0 SH 30 (SUTURE) IMPLANT
SUT SILK 2 0 SH (SUTURE) IMPLANT
SUT VIC AB 0 UR5 27 (SUTURE) IMPLANT
SYR 20ML ECCENTRIC (SYRINGE) ×1 IMPLANT
SYR 20ML LL LF (SYRINGE) ×1 IMPLANT
TIP INNERVISION DETACH 40FR (MISCELLANEOUS) IMPLANT
TIP INNERVISION DETACH 50FR (MISCELLANEOUS) IMPLANT
TIP INNERVISION DETACH 56FR (MISCELLANEOUS) IMPLANT
TOWEL OR 17X26 10 PK STRL BLUE (TOWEL DISPOSABLE) ×1 IMPLANT
TRAY FOLEY MTR SLVR 16FR STAT (SET/KITS/TRAYS/PACK) IMPLANT
TROCAR ADV FIXATION 12X100MM (TROCAR) IMPLANT
TROCAR ADV FIXATION 5X100MM (TROCAR) IMPLANT
TROCAR XCEL NON-BLD 5MMX100MML (ENDOMECHANICALS) ×1 IMPLANT
TUBING INSUFFLATION 10FT LAP (TUBING) ×1 IMPLANT

## 2023-11-02 NOTE — Anesthesia Procedure Notes (Signed)
 Procedure Name: Intubation Date/Time: 11/02/2023 7:36 AM  Performed by: Franchot Delon RAMAN, CRNAPre-anesthesia Checklist: Patient identified, Emergency Drugs available, Suction available and Patient being monitored Patient Re-evaluated:Patient Re-evaluated prior to induction Oxygen Delivery Method: Circle System Utilized Preoxygenation: Pre-oxygenation with 100% oxygen Induction Type: IV induction Ventilation: Mask ventilation without difficulty Laryngoscope Size: Mac and 3 Grade View: Grade I Tube type: Oral Tube size: 7.0 mm Number of attempts: 1 Airway Equipment and Method: Stylet Placement Confirmation: ETT inserted through vocal cords under direct vision, positive ETCO2 and breath sounds checked- equal and bilateral Secured at: 22 cm Tube secured with: Tape Dental Injury: Teeth and Oropharynx as per pre-operative assessment

## 2023-11-02 NOTE — Anesthesia Preprocedure Evaluation (Addendum)
 Anesthesia Evaluation  Patient identified by MRN, date of birth, ID band Patient awake    Reviewed: Allergy & Precautions, NPO status , Patient's Chart, lab work & pertinent test results  Airway Mallampati: II  TM Distance: >3 FB Neck ROM: Full    Dental no notable dental hx.    Pulmonary neg pulmonary ROS   Pulmonary exam normal        Cardiovascular hypertension, Pt. on home beta blockers Normal cardiovascular exam     Neuro/Psych  Headaches PSYCHIATRIC DISORDERS Anxiety Depression     Neuromuscular disease    GI/Hepatic Neg liver ROS, hiatal hernia,GERD  Medicated and Controlled,,  Endo/Other  negative endocrine ROS    Renal/GU negative Renal ROS     Musculoskeletal  (+) Arthritis ,    Abdominal   Peds  Hematology negative hematology ROS (+)   Anesthesia Other Findings   Reproductive/Obstetrics                              Anesthesia Physical Anesthesia Plan  ASA: 2  Anesthesia Plan: General   Post-op Pain Management: Tylenol  PO (pre-op)*   Induction: Intravenous and Rapid sequence  PONV Risk Score and Plan: 3 and Ondansetron , Dexamethasone , Midazolam  and Treatment may vary due to age or medical condition  Airway Management Planned: Oral ETT  Additional Equipment:   Intra-op Plan:   Post-operative Plan: Extubation in OR  Informed Consent: I have reviewed the patients History and Physical, chart, labs and discussed the procedure including the risks, benefits and alternatives for the proposed anesthesia with the patient or authorized representative who has indicated his/her understanding and acceptance.     Dental advisory given  Plan Discussed with: CRNA  Anesthesia Plan Comments:          Anesthesia Quick Evaluation

## 2023-11-02 NOTE — Interval H&P Note (Signed)
 History and Physical Interval Note:  11/02/2023 7:29 AM  Gabrielle Gonzalez  has presented today for surgery, with the diagnosis of LARGE HIATAL HERNIA.  The various methods of treatment have been discussed with the patient and family. After consideration of risks, benefits and other options for treatment, the patient has consented to  Procedure(s) with comments: REPAIR, HERNIA, HIATAL, ROBOT-ASSISTED (N/A) - ROBOTIC ASSISTED REPAIR OF LARGE HIATAL HERNIA WITH POSSIBLE PARTIAL WRAP AND POSSIBLE MESH ENDOSCOPY, UPPER GI TRACT (N/A) as a surgical intervention.  The patient's history has been reviewed, patient examined, no change in status, stable for surgery.  I have reviewed the patient's chart and labs.  Questions were answered to the patient's satisfaction.     Camellia Blush

## 2023-11-02 NOTE — Transfer of Care (Signed)
 Immediate Anesthesia Transfer of Care Note  Patient: MIAMI LATULIPPE  Procedure(s) Performed: REPAIR, HERNIA, HIATAL, ROBOT-ASSISTED WITH GASTROPEXY ENDOSCOPY, UPPER GI TRACT  Patient Location: PACU  Anesthesia Type:General  Level of Consciousness: awake, alert , oriented, and patient cooperative  Airway & Oxygen Therapy: Patient Spontanous Breathing and Patient connected to face mask oxygen  Post-op Assessment: Report given to RN and Post -op Vital signs reviewed and stable  Post vital signs: Reviewed and stable  Last Vitals:  Vitals Value Taken Time  BP 148/92 11/02/23 10:31  Temp    Pulse 70 11/02/23 10:35  Resp 17 11/02/23 10:35  SpO2 97 % 11/02/23 10:35  Vitals shown include unfiled device data.  Last Pain:  Vitals:   11/02/23 0545  TempSrc: Oral  PainSc: 0-No pain         Complications: No notable events documented.

## 2023-11-02 NOTE — Op Note (Signed)
 PRE-OPERATIVE DIAGNOSIS:  LARGE HIATAL HERNIA   POST-OPERATIVE DIAGNOSIS:  LARGE TYPE III HIATAL HERNIA   PROCEDURE:  Procedure(s): XI ROBOTIC ASSISTED  TYPE III HIATAL HERNIA REPAIR  and GASTROPEXY AND BILATERAL TAP BLOCK ESOPHAGOGASTRODUODENOSCOPY (EGD)   SURGEON:  Surgeon(s): Tanda Locus, MD FACS     ASSISTANTS: Mitzie Freund MD FACS   ANESTHESIA:   general   DRAINS: none   EBL: minimal   LOCAL MEDICATIONS USED:  MARCAINE        SPECIMEN:  Source of Specimen:  HERNIA SAC   DISPOSITION OF SPECIMEN:   DISCARDED   COUNTS:  YES   INDICATION FOR PROCEDURE: 68 yo was referred for management of a large type III hiatal hernia.   We had an extensive conversation regarding surgical repair.  We discussed potentially not being able to do a fundoplication if we were unable to achieve adequate intra-abdominal esophageal length.  We also discussed potential mesh insertion.  Please see chart for additional details.  She had had preoperative manometry at [facility] that showed 7 out of 10 swallows were effective   PROCEDURE: Patient received 5000 units of subcutaneous heparin  preoperatively.  Patient was given Tylenol  and Decadron  and Zofran  preoperatively.  After informed consent was obtained patient was taken to the operating room at [facility].  They were placed upon on the operating room table.  General endotracheal anesthesia was established.  Sequential compression devices were placed.  Usual standard surgical fashion with ChloraPrep.  The patient was on IV antibiotic prior to skin incision.  A surgical timeout was performed.   Access to the abdomen was obtained using the Optiview technique in the left mid abdomen about 8 cm to the left of the umbilicus in the midclavicular line.  Using a 0 degree 5 mm laparoscope through a 5 mm trocar it was advanced through all layers of the abdominal wall and the abdominal cavity was carefully entered.  Pneumoperitoneum was smoothly established  without any change in patient vital signs.  The laparoscope was advanced and the abdominal cavity was surveilled.  There was a little bit of omentum adhered to the lower midline but nothing significant.  There was no evidence of injury.  Patient was placed in reverse Trendelenburg.  A robotic 8 trocar was placed just above to the umbilicus.  An additional 8 mm robotic trocar was placed in the right mid abdomen and a assistant 5 mm trocar was placed in the right lateral abdominal wall.  We exchanged the Optiview trocar for a robotic 8 trocar and a final 8 mm robotic trocar was placed in the left lateral abdominal wall.  A bilateral laparoscopic tap block was performed for postoperative pain relief.  A Nathanson liver retractor was placed through a subxiphoid small incision to lift up the left lobe of the liver providing excellent visualization of the diaphragm.  The [employer] robot was then docked to the trocars.  A tip up grasper in arm 4, vessel sealer in arm 3, camera in arm 2 and a fenestrated bipolar in arm 1.  At this time I went to the surgeon console while my assistant stayed at the bedside.  An assistant was needed to help with tissue retraction and manipulation given the complexity of the case.  The patient had a large type III hiatal hernia.  The GE junction and about half of the stomach was above the diaphragm.  Approximately one half of the stomach was above the diaphragm.     I  started my approach along the anterior diaphragm incising the peritoneum with the vessel sealer.  I was able to peel and get into the space of the hernia sac.  I continued this toward the right crus of the diaphragm.  We identified the plane between the hernia sac and the peritoneum along the right crus.  This was again taken down in sequential fashion using the vessel sealer.  The gastrohepatic ligament was incised with the vessel sealer.  We identified the plane along the right crus between the peritoneum and the  herniated abdominal contents in the hernia sac.  This was gently incised with the vessel sealer.  Then with some gentle blunt dissection reduced some of the right sided hernia sac.  Some omentum was also reduced from the mediastinum as well.  I then turned my attention and started taking the hernia sac down along the left anterior diaphragm toward the left crus.  We then decided to mobilize and take down some of the short gastric vessels off the proximal greater curvature of the stomach.  These were taken down in sequential fashion all the way up to the cardia and to the left crus.  We then turned our attention posteriorly to the junction of the left and right crus posteriorly.  I was able then to pass a Penrose drain around the retroesophageal area.  My assistant held this as I continued and started circumferential mobilization of the proximal stomach and esophagus in the mediastinum.  The aorta was visualized.  We did  see a right vagus nerve.  We did also visualize the left vagus nerve.  The right lateral plane in the mediastinum was a little bit more adhered or adhesed in this area.  However we were able to identify the esophagus easily but did take our time in this area since this plane was a little bit more adhered.  We continue to circumstantial mobilization posteriorly, anteriorly, left and right around the esophagus in the mediastinum.  Again did visualize the left and right vagus nerves and preserved them.   Patient appeared to have a foreshortened esophagus.  We had achieved about 1 cm of intra-abdominal esophageal length.  This time I debrided some of the hernia sac around the GE junction with the vessel sealer ensuring that we were not on the stomach or on the esophagus.  We returned to the mediastinum and a look to see if there is any additional length that we could obtain.  I did not feel that by taking the vagus that would give us  significant additional length.  I then obtained the Olympus endoscope  and went to the head of the bed and placed the endoscope in the patient's oropharynx and gently glided it down.  The patient had fluid in her upper mid and distal esophagus along with some retained partially digested food particles.  I suctioned those out.  I then brought the scope back up to the proximal esophagus and went back down and there is no evidence of esophageal injury.  I was able to easily advance the endoscope into the stomach.  I then pulled back on the endoscope and visualized the Z-line.  We transilluminated.  The Z-line was approximately 1 cm below the diaphragm.  Again reinspected the distal and midesophagus and there is no evidence of esophageal injury.  The scope was advanced into the stomach and insufflated and retroflexed and there was some evidence of gastritis in the proximal stomach that had been herniated into the mediastinum.  The mucosa was viable but there was definite evidence of gastritis.  The stomach was desufflated and the endoscope was withdrawn and removed.  I returned to the surgeon console.  After discussion with partner I did not feel that we could achieve any more significant intra-abdominal esophageal length by more proximal mobilization in the mediastinum.  We reduced intra-abdominal pressure to 10 mmHg.  I then reapproximated the left and right crura posteriorly with 4 interrupted 0 Ethibond sutures.  I did place an additional 0 Ethibond suture between the anterior diaphragm on patient's left to the left crus sort of from the 1:00 to 4 o'clock position.    I then placed 3 interrupted 0 Ethibond sutures along the cardia and fundus of the stomach to the diaphragm as a gastropexy suture.  The stomach looked to be laying in the correct orientation.  There is no evidence of twisting of the stomach after a gastropexy sutures.  Those were removed along with the excised hernia sac.  Liver retractor was removed.  There is no evidence of injury to the left lobe of the liver.  The  robot was undocked.  I scrubbed back in.  Trocars removed and the skin incisions were closed with a 4 Monocryl followed by the application of Steri-Strips, 2 by twos and Tegaderms..  All needle, instrument, and sponge counts were correct x 2.  There were no immediate complications.  Patient tolerated suture well.  Patient was extubated and taken recovery room in stable condition  Findings: We identified the left and right vagus nerves and preserve those.  Only able to achieve about 1 cm to 1 and half centimeters of intra-abdominal esophageal length.  Also patient had a fair amount of fluid in her esophagus with partially digested food particles so between the lack of significant intra-abdominal esophageal length and the fluid and food debris in the esophagus we did not do a fundoplication.  PLAN OF CARE: Admit for overnight observation   PATIENT DISPOSITION:  PACU - hemodynamically stable.   Delay start of Pharmacological VTE agent (>24hrs) due to surgical blood loss or risk of bleeding:  no

## 2023-11-02 NOTE — Anesthesia Postprocedure Evaluation (Signed)
 Anesthesia Post Note  Patient: Gabrielle Gonzalez  Procedure(s) Performed: REPAIR, HERNIA, HIATAL, ROBOT-ASSISTED WITH GASTROPEXY ENDOSCOPY, UPPER GI TRACT     Patient location during evaluation: PACU Anesthesia Type: General Level of consciousness: awake and alert Pain management: pain level controlled Vital Signs Assessment: post-procedure vital signs reviewed and stable Respiratory status: spontaneous breathing, nonlabored ventilation and respiratory function stable Cardiovascular status: blood pressure returned to baseline and stable Postop Assessment: no apparent nausea or vomiting Anesthetic complications: no   No notable events documented.  Last Vitals:  Vitals:   11/02/23 1133 11/02/23 1159  BP:  (!) 147/86  Pulse: 66 67  Resp: (!) 21 16  Temp:  36.6 C  SpO2: 100% 99%    Last Pain:  Vitals:   11/02/23 1159  TempSrc: Oral  PainSc: 4                  Butler Levander Pinal

## 2023-11-03 ENCOUNTER — Encounter (HOSPITAL_COMMUNITY): Payer: Self-pay | Admitting: General Surgery

## 2023-11-03 ENCOUNTER — Ambulatory Visit (HOSPITAL_COMMUNITY)

## 2023-11-03 DIAGNOSIS — K449 Diaphragmatic hernia without obstruction or gangrene: Secondary | ICD-10-CM | POA: Diagnosis not present

## 2023-11-03 LAB — CBC
HCT: 37.1 % (ref 36.0–46.0)
Hemoglobin: 11.3 g/dL — ABNORMAL LOW (ref 12.0–15.0)
MCH: 28 pg (ref 26.0–34.0)
MCHC: 30.5 g/dL (ref 30.0–36.0)
MCV: 92.1 fL (ref 80.0–100.0)
Platelets: 226 K/uL (ref 150–400)
RBC: 4.03 MIL/uL (ref 3.87–5.11)
RDW: 14.2 % (ref 11.5–15.5)
WBC: 9 K/uL (ref 4.0–10.5)
nRBC: 0 % (ref 0.0–0.2)

## 2023-11-03 LAB — BASIC METABOLIC PANEL WITH GFR
Anion gap: 7 (ref 5–15)
BUN: 9 mg/dL (ref 8–23)
CO2: 26 mmol/L (ref 22–32)
Calcium: 9 mg/dL (ref 8.9–10.3)
Chloride: 107 mmol/L (ref 98–111)
Creatinine, Ser: 0.98 mg/dL (ref 0.44–1.00)
GFR, Estimated: >60 mL/min (ref >60)
Glucose, Bld: 128 mg/dL — ABNORMAL HIGH (ref 70–99)
Potassium: 4.3 mmol/L (ref 3.5–5.1)
Sodium: 140 mmol/L (ref 135–145)

## 2023-11-03 MED ORDER — ONDANSETRON HCL 4 MG PO TABS: 4.0000 mg | ORAL_TABLET | Freq: Three times a day (TID) | ORAL | 0 refills | Status: DC | PRN

## 2023-11-03 MED ORDER — TRAMADOL HCL 50 MG PO TABS: 50.0000 mg | ORAL_TABLET | Freq: Four times a day (QID) | ORAL | 0 refills | Status: DC | PRN

## 2023-11-03 MED ORDER — BOOST / RESOURCE BREEZE PO LIQD CUSTOM
1.0000 | Freq: Two times a day (BID) | ORAL | Status: DC
  Administered 2023-11-03 (×2): 1 via ORAL

## 2023-11-03 MED ORDER — IOHEXOL 300 MG/ML  SOLN: 86.0000 mL | Freq: Once | INTRAMUSCULAR | Status: DC | PRN

## 2023-11-03 MED ORDER — OMEPRAZOLE MAGNESIUM 20 MG PO TBEC: 20.0000 mg | DELAYED_RELEASE_TABLET | Freq: Every day | ORAL | 1 refills | Status: AC

## 2023-11-03 NOTE — Discharge Instructions (Signed)
EATING AFTER YOUR ESOPHAGEAL SURGERY (Stomach Fundoplication, Hiatal Hernia repair, Achalasia surgery, etc)  ######################################################################  EAT Start with a full liquid diet (see below) Gradually transition to a high fiber diet with a fiber supplement over the next month after discharge.    WALK Walk an hour a day.  Control your pain to do that.    CONTROL PAIN Control pain so that you can walk, sleep, tolerate sneezing/coughing, go up/down stairs.  HAVE A BOWEL MOVEMENT DAILY Keep your bowels regular to avoid problems.  OK to try a laxative to override constipation.  OK to use an antidairrheal to slow down diarrhea.  Call if not better after 2 tries  CALL IF YOU HAVE PROBLEMS/CONCERNS Call if you are still struggling despite following these instructions. Call if you have concerns not answered by these instructions  ######################################################################   After your esophageal surgery, expect some sticking with swallowing over the next 1-2 months.    If food sticks when you eat, it is called "dysphagia".  This is due to swelling around your esophagus at the wrap & hiatal diaphragm repair.  It will gradually ease off over the next few months.  To help you through this temporary phase, we start you out on a full liquid diet.  Your first meal in the hospital was thin liquids.  You should have been given a full liquid diet by the time you left the hospital. Stay on clears and full liquids for the first week. Some patients may need to stay on a liquid diet for up to 2 weeks if having trouble swallowing.  Once tolerating that well, you can advance to pureed diet.   We ask patients to stay on a pureed diet for the 2nd-3rd week to avoid anything getting "stuck" near your recent surgery.  Don't be alarmed if your ability to swallow doesn't progress according to this plan.  Everyone is different and some diets can advance  more or less quickly.    It is often helpful to crush your medications or split them as they can sometimes stick, especially the first week or so.   Some BASIC RULES to follow are: Maintain an upright position whenever eating or drinking. Take small bites - just a teaspoon size bite at a time. Eat slowly.  It may also help to eat only one food at a time. Consider nibbling through smaller, more frequent meals & avoid the urge to eat BIG meals Do not push through feelings of fullness, nausea, or bloatedness Do not mix solid foods and liquids in the same mouthful Try not to "wash foods down" with large gulps of liquids. Avoid carbonated (bubbly/fizzy) drinks.   Avoid foods that make you feel gassy or bloated.  Start with bland foods first.  Wait on trying greasy, fried, or spicy meals until you are tolerating more bland solids well. Understand that it will be hard to burp and belch at first.  This gradually improves with time.  Expect to be more gassy/flatulent/bloated initially.  Walking will help your body manage it better. Consider using medications for bloating that contain simethicone such as  Maalox or Gas-X  Consider crushing her medications, especially smaller pills.  The ability to swallow pills should get easier after a few weeks Eat in a relaxed atmosphere & minimize distractions. Avoid talking while eating.   Do not use straws. Following each meal, sit in an upright position (90 degree angle) for 60 to 90 minutes.  Going for a short walk can   help as well If food does stick, don't panic.  Try to relax and let the food pass on its own.  Sipping WARM LIQUID such as strong hot black tea can also help slide it down.   Be gradual in changes & use common sense:  -If you easily tolerating a certain "level" of foods, advance to the next level gradually -If you are having trouble swallowing a particular food, then avoid it.   -If food is sticking when you advance your diet, go back to  thinner previous diet (the lower LEVEL) for 1-2 days.  LEVEL 2 = PUREED DIET  Start 1- 2 WEEKS AFTER SURGERY IF YOU ARE TOLERATING A FULL LIQUID DIET EASILY  -Foods in this group are pureed or blenderized to a smooth, mashed potato-like consistency.  -If necessary, the pureed foods can keep their shape with the addition of a thickening agent.   -Meat should be pureed to a smooth, pasty consistency.  Hot broth or gravy may be added to the pureed meat, approximately 1 oz. of liquid per 3 oz. serving of meat. -CAUTION:  If any foods do not puree into a smooth consistency, swallowing will be more difficult.  (For example, nuts or seeds sometimes do not blend well.)  Hot Foods Cold Foods  Pureed scrambled eggs and cheese Pureed cottage cheese  Baby cereals Thickened juices and nectars  Thinned cooked cereals (no lumps) Thickened milk or eggnog  Pureed French toast or pancakes Ensure  Mashed potatoes Ice cream  Pureed parsley, au gratin, scalloped potatoes, candied sweet potatoes Fruit or Italian ice, sherbet  Pureed buttered or alfredo noodles Plain yogurt  Pureed vegetables (no corn or peas) Instant breakfast  Pureed soups and creamed soups Smooth pudding, mousse, custard  Pureed scalloped apples Whipped gelatin  Gravies Sugar, syrup, honey, jelly  Sauces, cheese, tomato, barbecue, white, creamed Cream  Any baby food Creamer  Alcohol in moderation (not beer or champagne) Margarine  Coffee or tea Mayonnaise   Ketchup, mustard   Apple sauce   SAMPLE MENU:  PUREED DIET Breakfast Lunch Dinner  Orange juice, 1/2 cup Cream of wheat, 1/2 cup Pineapple juice, 1/2 cup Pureed turkey, barley soup, 3/4 cup Pureed Hawaiian chicken, 3 oz  Scrambled eggs, mashed or blended with cheese, 1/2 cup Tea or coffee, 1 cup  Whole milk, 1 cup  Non-dairy creamer, 2 Tbsp. Mashed potatoes, 1/2 cup Pureed cooled broccoli, 1/2 cup Apple sauce, 1/2 cup Coffee or tea Mashed potatoes, 1/2 cup Pureed spinach,  1/2 cup Frozen yogurt, 1/2 cup Tea or coffee      LEVEL 3 = SOFT DIET  After your first 4 weeks, you can advance to a soft diet.   Keep on this diet until everything goes down easily.  Hot Foods Cold Foods  White fish Cottage cheese  Stuffed fish Junior baby fruit  Baby food meals Semi thickened juices  Minced soft cooked, scrambled, poached eggs nectars  Souffle & omelets Ripe mashed bananas  Cooked cereals Canned fruit, pineapple sauce, milk  potatoes Milkshake  Buttered or Alfredo noodles Custard  Cooked cooled vegetable Puddings, including tapioca  Sherbet Yogurt  Vegetable soup or alphabet soup Fruit ice, Italian ice  Gravies Whipped gelatin  Sugar, syrup, honey, jelly Junior baby desserts  Sauces:  Cheese, creamed, barbecue, tomato, white Cream  Coffee or tea Margarine   SAMPLE MENU:  LEVEL 3 Breakfast Lunch Dinner  Orange juice, 1/2 cup Oatmeal, 1/2 cup Scrambled eggs with cheese, 1/2 cup Decaffeinated tea,   1 cup Whole milk, 1 cup Non-dairy creamer, 2 Tbsp Pineapple juice, 1/2 cup Minced beef, 3 oz Gravy, 2 Tbsp Mashed potatoes, 1/2 cup Minced fresh broccoli, 1/2 cup Applesauce, 1/2 cup Coffee, 1 cup Turkey, barley soup, 3/4 cup Minced Hawaiian chicken, 3 oz Mashed potatoes, 1/2 cup Cooked spinach, 1/2 cup Frozen yogurt, 1/2 cup Non-dairy creamer, 2 Tbsp      LEVEL 4 = CHOPPED DIET  -After all the foods in level 3 (soft diet) are passing through well you should advance up to more chopped foods.  -It is still important to cut these foods into small pieces and eat slowly.  Hot Foods Cold Foods  Poultry Cottage cheese  Chopped Swedish meatballs Yogurt  Meat salads (ground or flaked meat) Milk  Flaked fish (tuna) Milkshakes  Poached or scrambled eggs Soft, cold, dry cereal  Souffles and omelets Fruit juices or nectars  Cooked cereals Chopped canned fruit  Chopped French toast or pancakes Canned fruit cocktail  Noodles or pasta (no rice) Pudding,  mousse, custard  Cooked vegetables (no frozen peas, corn, or mixed vegetables) Green salad  Canned small sweet peas Ice cream  Creamed soup or vegetable soup Fruit ice, Italian ice  Pureed vegetable soup or alphabet soup Non-dairy creamer  Ground scalloped apples Margarine  Gravies Mayonnaise  Sauces:  Cheese, creamed, barbecue, tomato, white Ketchup  Coffee or tea Mustard   SAMPLE MENU:  LEVEL 4 Breakfast Lunch Dinner  Orange juice, 1/2 cup Oatmeal, 1/2 cup Scrambled eggs with cheese, 1/2 cup Decaffeinated tea, 1 cup Whole milk, 1 cup Non-dairy creamer, 2 Tbsp Ketchup, 1 Tbsp Margarine, 1 tsp Salt, 1/4 tsp Sugar, 2 tsp Pineapple juice, 1/2 cup Ground beef, 3 oz Gravy, 2 Tbsp Mashed potatoes, 1/2 cup Cooked spinach, 1/2 cup Applesauce, 1/2 cup Decaffeinated coffee Whole milk Non-dairy creamer, 2 Tbsp Margarine, 1 tsp Salt, 1/4 tsp Pureed turkey, barley soup, 3/4 cup Barbecue chicken, 3 oz Mashed potatoes, 1/2 cup Ground fresh broccoli, 1/2 cup Frozen yogurt, 1/2 cup Decaffeinated tea, 1 cup Non-dairy creamer, 2 Tbsp Margarine, 1 tsp Salt, 1/4 tsp Sugar, 1 tsp    LEVEL 5:  REGULAR FOODS  -Foods in this group are soft, moist, regularly textured foods.   -This level includes meat and breads, which tend to be the hardest things to swallow.   -Eat very slowly, chew well and continue to avoid carbonated drinks. -most people are at this level in 6 weeks  Hot Foods Cold Foods  Baked fish or skinned Soft cheeses - cottage cheese  Souffles and omelets Cream cheese  Eggs Yogurt  Stuffed shells Milk  Spaghetti with meat sauce Milkshakes  Cooked cereal Cold dry cereals (no nuts, dried fruit, coconut)  French toast or pancakes Crackers  Buttered toast Fruit juices or nectars  Noodles or pasta (no rice) Canned fruit  Potatoes (all types) Ripe bananas  Soft, cooked vegetables (no corn, lima, or baked beans) Peeled, ripe, fresh fruit  Creamed soups or vegetable soup Cakes  (no nuts, dried fruit, coconut)  Canned chicken noodle soup Plain doughnuts  Gravies Ice cream  Bacon dressing Pudding, mousse, custard  Sauces:  Cheese, creamed, barbecue, tomato, white Fruit ice, Italian ice, sherbet  Decaffeinated tea or coffee Whipped gelatin  Pork chops Regular gelatin   Canned fruited gelatin molds   Sugar, syrup, honey, jam, jelly   Cream   Non-dairy   Margarine   Oil   Mayonnaise   Ketchup   Mustard   TROUBLESHOOTING IRREGULAR BOWELS    1) Avoid extremes of bowel movements (no bad constipation/diarrhea)  2) Miralax 17gm mixed in 8oz. water or juice-daily. May use BID as needed.  3) Gas-x,Phazyme, etc. as needed for gas & bloating.  4) Soft,bland diet. No spicy,greasy,fried foods.  5) Prilosec over-the-counter as needed  6) May hold gluten/wheat products from diet to see if symptoms improve.  7) May try probiotics (Align, Activa, etc) to help calm the bowels down  7) If symptoms become worse call back immediately.    If you have any questions please call our office at [facility]: [phone].  

## 2023-11-03 NOTE — Progress Notes (Signed)
   11/03/23 0836  TOC Brief Assessment  Insurance and Status Reviewed  Patient has primary care physician Yes  Home environment has been reviewed home with spouse  Prior level of function: independent  Prior/Current Home Services No current home services  Social Drivers of Health Review SDOH reviewed no interventions necessary  Readmission risk has been reviewed Yes  Transition of care needs no transition of care needs at this time

## 2023-11-03 NOTE — Plan of Care (Signed)

## 2023-11-03 NOTE — Progress Notes (Signed)
 Discharge instructions given to patient and all questions were answered.

## 2023-11-04 NOTE — Discharge Summary (Signed)
 Physician Discharge Summary  Gabrielle Gonzalez FMW:[id number] DOB: May 04, 1955 DOA: 11/02/2023  PCP: Joshua Santana CROME, NP  Admit date: 11/02/2023 Discharge date: 11/03/2023  Recommendations for Outpatient Follow-up:     Follow-up Information     Tanda Locus, MD Follow up.   Specialty: General Surgery Why: at previously scheduled postop appt in october Contact information: [address] [place] [address] [phone]                Discharge Diagnoses:  Large type III hiatal hernia s/p repair  Surgical Procedure: XI ROBOTIC ASSISTED  TYPE III HIATAL HERNIA REPAIR  and GASTROPEXY AND BILATERAL TAP BLOCK/ ESOPHAGO/GASTRODUODENOSCOPY (EGD) 11/02/23  Discharge Condition: good Disposition: home  Diet recommendation: full liquid  Filed Weights   11/02/23 1201  Weight: 59 kg    History of present illness: Gabrielle Gonzalez is a 68 year old female with a large hiatal hernia who presents for evaluation and management. She was referred by Dr. Nandigam for evaluation of her large hiatal hernia.  She experiences occasional heartburn, which is managed with omeprazole  taken twice a week and generic Tums as needed. She previously took omeprazole  daily but reduced the frequency due to concerns about long-term use. Caffeine, large meals, and tomato sauce exacerbate her symptoms, while avoiding these helps reduce heartburn.  She occasionally experiences a sensation of food getting stuck, particularly with solids, occurring a couple of times a month. This is usually resolved by drinking more liquid. No significant pain with swallowing and infrequent vomiting unless she has a severe coughing fit or is ill. She describes having a 'weak stomach' and is prone to stomach issues when exposed to others with similar conditions.  She sometimes sleeps with her head elevated to alleviate heartburn, especially when symptoms are bothersome. Her bowel movements are irregular, alternating between  loose and hard stools, but she has a daily bowel movement. She had a colon resection a year and a half ago due to diverticulitis, which was performed robotically and went well.    Hospital Course:  She was brought in for planned hiatal hernia repair.  We did not do a fundoplication because of only achieved about 1.5 com of intra-abdominal esophageal length.  She was kept overnight for observation.  She was started on clear liquids on postop day 0 which she tolerated.  On postop day 1 she was tolerating clear liquids.  She was afebrile with vital signs stable.  No clinical signs of leak.  She underwent an upper GI to document the anatomy.  There is no signs of leak.  She was advanced to full liquids which she tolerated.  We discussed discharge instructions and expectations for the next 2 to 3 months.  She will go out on a full liquid diet for about a week and then advance to pured foods and then gradually advance per protocol  BP 133/89 (BP Location: Right Arm)   Pulse 64   Temp 98.9 F (37.2 C) (Oral)   Resp 18   Ht 5' 1 (1.549 m)   Wt 59 kg   LMP 06/18/2011   SpO2 93%   BMI 24.56 kg/m   Gen: alert, NAD, non-toxic appearing Pupils: equal, no scleral icterus Pulm: Lungs clear to auscultation, symmetric chest rise CV: regular rate and rhythm Abd: soft, min tender, nondistended.  No cellulitis. No incisional hernia Ext: no edema, no calf tenderness Skin: no rash, no jaundice   Discharge Instructions  Discharge Instructions     Call  MD for:   Complete by: As directed    Temperature >101   Call MD for:  hives   Complete by: As directed    Call MD for:  persistant dizziness or light-headedness   Complete by: As directed    Call MD for:  persistant nausea and vomiting   Complete by: As directed    Call MD for:  redness, tenderness, or signs of infection (pain, swelling, redness, odor or green/yellow discharge around incision site)   Complete by: As directed    Call MD for:   severe uncontrolled pain   Complete by: As directed    Diet full liquid   Complete by: As directed    Discharge instructions   Complete by: As directed    See [facility] discharge instructions   Increase activity slowly   Complete by: As directed       Allergies as of 11/03/2023       Reactions   Erythromycin Nausea And Vomiting   Lodine [etodolac] Other (See Comments)   GI burning         Medication List     TAKE these medications    acetaminophen  500 MG tablet Commonly known as: TYLENOL  Take 1,000-1,500 mg by mouth 2 (two) times daily as needed (pain).   cholecalciferol 25 MCG (1000 UNIT) tablet Commonly known as: VITAMIN D3 Take 1,000 Units by mouth daily.   fenofibrate  micronized 200 MG capsule Commonly known as: LOFIBRA Take 200 mg by mouth daily.   Magnesium  400 MG Tabs Take 400 mg by mouth daily.   metoprolol  succinate 25 MG 24 hr tablet Commonly known as: TOPROL -XL Take 1 tablet (25 mg total) by mouth daily.   Ocuvite Adult 50+ Caps Take 1 capsule by mouth daily.   Algae Based Calcium Tabs Take 1 tablet by mouth daily.   omeprazole  20 MG tablet Commonly known as: PRILOSEC  OTC Take 1 tablet (20 mg total) by mouth daily. What changed: when to take this   ondansetron  4 MG tablet Commonly known as: Zofran  Take 1 tablet (4 mg total) by mouth every 8 (eight) hours as needed for nausea or vomiting.   traMADol  50 MG tablet Commonly known as: ULTRAM  Take 1 tablet (50 mg total) by mouth every 6 (six) hours as needed for moderate pain (pain score 4-6) or severe pain (pain score 7-10).   tretinoin 0.1 % cream Commonly known as: RETIN-A Apply 1 application  topically daily as needed (rosacea).   venlafaxine  75 MG tablet Commonly known as: EFFEXOR  Take 75 mg by mouth every morning.   VITAMIN B COMPLEX PO Take 1 capsule by mouth every morning.        Follow-up Information     Tanda Locus, MD Follow up.   Specialty: General Surgery Why: at  previously scheduled postop appt in october Contact information: [address] [place] [address] [phone]                  The results of significant diagnostics from this hospitalization (including imaging, microbiology, ancillary and laboratory) are listed below for reference.    Significant Diagnostic Studies: DG UGI W SINGLE CM (SOL OR THIN BA) Result Date: 11/03/2023 CLINICAL DATA:  History of hiatal hernia. Status post primary repair of hiatal hernia with gastropexy. No fundoplication. Request for upper GI to evaluate postoperative complication. EXAM: DG UGI W SINGLE CM TECHNIQUE: Scout radiograph was obtained. Single contrast examination was performed using thin liquid barium. This exam  was performed by Franky Rusk PA-C , and was supervised and interpreted by Dr. Lynwood Seip. FLUOROSCOPY: Radiation Exposure Index (as provided by the fluoroscopic device): 60.80 mGy Kerma COMPARISON:  Upper GI series performed Jul 23, 2023 FINDINGS: Scout Radiograph: Nonspecific bowel-gas pattern. Esophagus: Tortuous distal esophagus. No definitive mucosal lesions or strictures. There is decrease in diameter of the GE junction, this may be related to post operative edema. Esophageal motility: Motility normal in an upright position. In prone or decubitus position, there is evidence of dysmotility with tertiary contractions and contrast stasis. Gastroesophageal reflux:  None demonstrated. Ingested 13 mm barium tablet:  Not given Stomach: Elongated J-shaped stomach without mucosal lesions or filling defects. No evidence of contrast extravasation. Prompt gastric emptying with normal-appearing duodenum with active peristalsis. IMPRESSION: Interval appearance of hiatal hernia repair. No evidence of contrast extravasation or postoperative leak. Tortuous distal esophagus and presence of at least moderate dysmotility. This may be related to postsurgical edema. Electronically Signed   By:  Lynwood Seip Raddle M.D.   On: 11/03/2023 09:15    Microbiology: No results found for this or any previous visit (from the past 240 hours).   Labs: Basic Metabolic Panel: Recent Labs  Lab 10/28/23 1053 11/03/23 0439  NA 138 140  K 4.1 4.3  CL 105 107  CO2 27 26  GLUCOSE 94 128*  BUN 19 9  CREATININE 1.07* 0.98  CALCIUM 9.8 9.0   Liver Function Tests: Recent Labs  Lab 10/28/23 1053  AST 31  ALT 24  ALKPHOS 50  BILITOT 0.5  PROT 7.1  ALBUMIN 3.8   No results for input(s): LIPASE, AMYLASE in the last 168 hours. No results for input(s): AMMONIA in the last 168 hours. CBC: Recent Labs  Lab 10/28/23 1053 11/03/23 0439  WBC 5.8 9.0  NEUTROABS 2.9  --   HGB 12.9 11.3*  HCT 40.5 37.1  MCV 90.6 92.1  PLT 256 226     Principal Problem:   S/P repair of paraesophageal hernia   Time coordinating discharge: 15 min  Signed:  Camellia CHRISTELLA Blush, MD [facility], [facility] [phone] 11/04/2023, 7:41 AM

## 2023-12-14 ENCOUNTER — Ambulatory Visit (HOSPITAL_BASED_OUTPATIENT_CLINIC_OR_DEPARTMENT_OTHER): Admitting: Cardiology

## 2023-12-28 ENCOUNTER — Encounter (HOSPITAL_BASED_OUTPATIENT_CLINIC_OR_DEPARTMENT_OTHER): Payer: Self-pay | Admitting: Cardiology

## 2023-12-28 ENCOUNTER — Ambulatory Visit (HOSPITAL_BASED_OUTPATIENT_CLINIC_OR_DEPARTMENT_OTHER): Admitting: Cardiology

## 2023-12-28 VITALS — BP 134/88 | HR 53 | Ht 61.0 in | Wt 132.2 lb

## 2023-12-28 DIAGNOSIS — I10 Essential (primary) hypertension: Secondary | ICD-10-CM | POA: Diagnosis not present

## 2023-12-28 DIAGNOSIS — R0609 Other forms of dyspnea: Secondary | ICD-10-CM

## 2023-12-28 DIAGNOSIS — I2510 Atherosclerotic heart disease of native coronary artery without angina pectoris: Secondary | ICD-10-CM | POA: Diagnosis not present

## 2023-12-28 DIAGNOSIS — I517 Cardiomegaly: Secondary | ICD-10-CM | POA: Diagnosis not present

## 2023-12-28 DIAGNOSIS — E782 Mixed hyperlipidemia: Secondary | ICD-10-CM | POA: Diagnosis not present

## 2023-12-28 DIAGNOSIS — Z7189 Other specified counseling: Secondary | ICD-10-CM

## 2023-12-28 NOTE — Progress Notes (Signed)
 Cardiology Office Note:  .    Date:  12/28/2023  ID:  Gabrielle Gonzalez, DOB 12-14-1955, MRN [id number] PCP: Gabrielle Santana CROME, NP  [facility] Providers Cardiologist:  Shelda Bruckner, MD     History of Present Illness: .    Gabrielle Gonzalez is a 68 y.o. female with a hx of hyperlipidemia, GERD, fatty liver (CT 01/2023), who is seen for follow-up. She was initially seen 04/08/2021 as a new consult at the request of Gabrielle Santana CROME, NP for the evaluation and management of newly recognized heart murmur and abnormal echo showing moderate mitral regurgitation.   CV history: Coronary CT 12/2022 showed Ca score 38.3 (71st %ile). TPV 65 (mild), focal mild atherosclerosis in mid LAD with 25-49% stenosis. Echo 03/13/21 (Care Everywhere) noted basal septal hypertrophy and severe LVOT obstruction with systolic anterior motion of the mitral valve. Gradient measured at 212 but thought not to be accurate. Moderate MR due to Gabrielle Gonzalez.   Family history: Her mother had hypertension. Her older sister has hypertension, and a history of slight heart murmur. Her younger sister was told she had a slight mitral valve prolapse.  Today: Overall doing well. Does feel somewhat out of breath climbing stairs, but she does not have to stop. Went to [employer], walked a lot and felt short of breath, but felt better when she got home. Wonders if it is deconditioning. Has been largely sedentary since her surgery in August. Discussed getting back to activity gradually.  Doesn't check BP at home. We discussed monitoring at home to see.  Discussed cholesterol, statin today, see below.  ROS:  Denies chest pain, shortness of breath at rest. No PND, orthopnea, LE edema or unexpected weight gain. No syncope or palpitations. ROS otherwise negative except as noted.   Studies Reviewed: SABRA    EKG Interpretation Date/Time:  Monday December 28 2023 10:52:57 EDT Ventricular Rate:  50 PR Interval:  178 QRS Duration:  92 QT  Interval:  452 QTC Calculation: 412 R Axis:   33  Text Interpretation: Sinus bradycardia When compared with ECG of 17-Dec-2022 07:59, No significant change was found Confirmed by Bruckner Shelda [phone]) on 12/28/2023 11:40:57 AM    Physical Exam:    VS:  BP 134/88   Pulse (!) 53   Ht 5' 1 (1.549 m)   Wt 132 lb 3.2 oz (60 kg)   LMP 06/18/2011   SpO2 96%   BMI 24.98 kg/m    Wt Readings from Last 3 Encounters:  12/28/23 132 lb 3.2 oz (60 kg)  11/02/23 130 lb (59 kg)  10/28/23 130 lb (59 kg)    GEN: Well nourished, well developed in no acute distress HEENT: Normal, moist mucous membranes NECK: No JVD CARDIAC: regular rhythm, normal S1 and S2, no rubs or gallops. 2/6 systolic murmur. VASCULAR: Radial and DP pulses 2+ bilaterally. No carotid bruits RESPIRATORY:  Clear to auscultation without rales, wheezing or rhonchi  ABDOMEN: Soft, non-tender, non-distended MUSCULOSKELETAL:  Ambulates independently SKIN: Warm and dry, no edema NEUROLOGIC:  Alert and oriented x 3. No focal neuro deficits noted. PSYCHIATRIC:  Normal affect   ASSESSMENT AND PLAN: .    Dyspnea on exertion -CT showed nonobstructive CAD, see below -symptoms are nonlimiting -reviewed echo as below -suspicion is that this is largely deconditioning. After shared decision making, she will gradually work to increase walking and cardiovascular activity, and we will see how she does -if she is still limited, we discussed stress echo for further evaluation (would  need baseline echo first) -reviewed red flag warning signs that need immediate medical attention   Nonobstructive CAD Mixed hyperlipidemia Fatty liver (by CT 01/2023) -on fenofibrate  -not on statin, discussed data and recommendations today. She wants to work on lifestyle, avoid statin if possible. Did discuss outcome data with statins, including risk reduction in MI, CVA, death -lipids from 07/08/2023: Tchol 162, TG 81, HDL 68, LDL 78 -discussed goal LDL  <70 -recheck at followup   Focal basal septal hypertrophy Documented LVOT obstruction with SAM and mitral regurgitation -I cannot see images, but reported as focal basal septal hypertrophy with severe obstruction and SAM/MR -no syncope or limiting exertional symptoms -tolerating metoprolol , reviewed importance of BP control -reviewed red flag warning signs that need immediate medical attention -avoid dehydration   Hypertension -would keep tight control to prevent progression of LVH -continue metoprolol  -monitor BP, discussed home monitoring, she has access to cuff -if BP is consistently high, would consider ARB given LVH. Would avoid diuretics   Cardiac risk counseling and prevention recommendations: -recommend heart healthy/Mediterranean diet, with whole grains, fruits, vegetable, fish, lean meats, nuts, and olive oil. Limit salt. -recommend moderate walking, 3-5 times/week for 30-50 minutes each session. Aim for at least 150 minutes.week. Goal should be pace of 3 miles/hours, or walking 1.5 miles in 30 minutes -recommend avoidance of tobacco products. Avoid excess alcohol.  Dispo: Follow-up in 6 mos or sooner as needed.  Signed, Shelda Bruckner, MD

## 2023-12-28 NOTE — Patient Instructions (Addendum)
 how to check blood pressure:  -sit comfortably in a chair, feet uncrossed and flat on floor, for 5-10 minutes  -arm ideally should rest at the level of the heart. However, arm should be relaxed and not tense (for example, do not hold the arm up unsupported)  -avoid exercise, caffeine, and tobacco for at least 30 minutes prior to BP reading  -don't take BP cuff reading over clothes (always place on skin directly)  -I prefer to know how well the medication is working, so I would like you to take your readings 1-2 hours after taking your blood pressure medication if possible   If blood pressure is consistently more than 130/80, let me know and we will discuss options.   No changes to medications No labs today No testing today Return in 6e months to see Dr. Lonni, Reche Castor, NP, Rosaline Bane, NP

## 2024-03-23 ENCOUNTER — Encounter: Payer: Self-pay | Admitting: Gastroenterology

## 2024-03-24 ENCOUNTER — Telehealth: Payer: Self-pay | Admitting: Gastroenterology

## 2024-03-24 ENCOUNTER — Encounter: Payer: Self-pay | Admitting: Gastroenterology

## 2024-03-24 NOTE — Telephone Encounter (Signed)
 Inbound call from patient stating she started to have diarrhea today 15 times. States she will be out of town for two weeks and requesting to be seen within the next week. Patient is scheduled for a colonoscopy on 3/23. Advised we do not have any availability in the next week. Requesting a call to discuss recommendations to help. Please advise, thank you

## 2024-03-25 NOTE — Telephone Encounter (Signed)
 Patient reports being seen at an urgent care last week for the diarrhea. She was given a 3 day course of Cipro , which she started taking after she submitted stool for testing through urgent care. She took the 3 days of Cipro  and had improvement. The specimen was lost and not repeated. Patient had a return of diarrhea a week and a day after antibiotics. She describes urgent stools that began in the night and continued into the morning until she felt emptied. She has not had diarrhea since. She is concerned because she will be leaving the country next Saturday 04/02/24. No openings on [facility] schedule. She was last seen in the office 2 years ago. Encouraged to ask the ordering provider of the stool study if resubmitting is an option. Other option is PCP. Patient is advised and agrees to this plan of care. No further questions at this time.

## 2024-05-16 ENCOUNTER — Encounter

## 2024-05-30 ENCOUNTER — Encounter: Admitting: Gastroenterology
